# Patient Record
Sex: Female | Born: 1985 | Race: White | Hispanic: No | Marital: Married | Smoking: Never smoker
Health system: Southern US, Community
[De-identification: ages and names within clinical notes are randomized; demographics above are authoritative.]

## PROBLEM LIST (undated history)

## (undated) ENCOUNTER — Inpatient Hospital Stay (HOSPITAL_COMMUNITY): Payer: Self-pay

## (undated) DIAGNOSIS — IMO0002 Reserved for concepts with insufficient information to code with codable children: Secondary | ICD-10-CM

## (undated) HISTORY — DX: Reserved for concepts with insufficient information to code with codable children: IMO0002

---

## 2007-01-16 ENCOUNTER — Inpatient Hospital Stay (HOSPITAL_COMMUNITY): Admission: AD | Admit: 2007-01-16 | Discharge: 2007-01-16 | Payer: Self-pay | Admitting: Obstetrics and Gynecology

## 2008-06-01 ENCOUNTER — Inpatient Hospital Stay (HOSPITAL_COMMUNITY): Admission: AD | Admit: 2008-06-01 | Discharge: 2008-06-01 | Payer: Self-pay | Admitting: Obstetrics and Gynecology

## 2008-12-12 ENCOUNTER — Inpatient Hospital Stay (HOSPITAL_COMMUNITY): Admission: AD | Admit: 2008-12-12 | Discharge: 2008-12-12 | Payer: Self-pay | Admitting: Obstetrics and Gynecology

## 2008-12-26 ENCOUNTER — Inpatient Hospital Stay (HOSPITAL_COMMUNITY): Admission: AD | Admit: 2008-12-26 | Discharge: 2008-12-26 | Payer: Self-pay | Admitting: Obstetrics and Gynecology

## 2008-12-27 ENCOUNTER — Inpatient Hospital Stay (HOSPITAL_COMMUNITY): Admission: AD | Admit: 2008-12-27 | Discharge: 2008-12-29 | Payer: Self-pay | Admitting: Obstetrics and Gynecology

## 2009-10-24 ENCOUNTER — Emergency Department (HOSPITAL_COMMUNITY): Admission: EM | Admit: 2009-10-24 | Discharge: 2009-10-24 | Payer: Self-pay | Admitting: Emergency Medicine

## 2010-10-27 LAB — POCT I-STAT, CHEM 8
Calcium, Ion: 1.11 mmol/L — ABNORMAL LOW (ref 1.12–1.32)
Creatinine, Ser: 0.6 mg/dL (ref 0.4–1.2)
Glucose, Bld: 93 mg/dL (ref 70–99)
HCT: 50 % — ABNORMAL HIGH (ref 36.0–46.0)
Sodium: 141 mEq/L (ref 135–145)

## 2010-10-27 LAB — POCT PREGNANCY, URINE: Preg Test, Ur: NEGATIVE

## 2010-11-11 LAB — URINALYSIS, ROUTINE W REFLEX MICROSCOPIC
Ketones, ur: NEGATIVE mg/dL
Specific Gravity, Urine: 1.015 (ref 1.005–1.030)
Urobilinogen, UA: 0.2 mg/dL (ref 0.0–1.0)
pH: 8 (ref 5.0–8.0)

## 2010-11-11 LAB — WET PREP, GENITAL
Trich, Wet Prep: NONE SEEN
Yeast Wet Prep HPF POC: NONE SEEN

## 2010-11-11 LAB — CBC
HCT: 29.3 % — ABNORMAL LOW (ref 36.0–46.0)
Hemoglobin: 12.9 g/dL (ref 12.0–15.0)
MCV: 94.4 fL (ref 78.0–100.0)
Platelets: 122 10*3/uL — ABNORMAL LOW (ref 150–400)
RBC: 3.09 MIL/uL — ABNORMAL LOW (ref 3.87–5.11)
RBC: 3.94 MIL/uL (ref 3.87–5.11)
WBC: 14.6 10*3/uL — ABNORMAL HIGH (ref 4.0–10.5)

## 2010-12-16 NOTE — Discharge Summary (Signed)
NAMEMarland Kitchen  AMIRE, GOSSEN              ACCOUNT NO.:  [id number]   MEDICAL RECORD NO.:  [id number]          PATIENT TYPE:  INP   LOCATION:  9108                          FACILITY:  [facility]   PHYSICIAN:  Osborn Coho, M.D.   DATE OF BIRTH:  1986/07/06   DATE OF ADMISSION:  12/27/2008  DATE OF DISCHARGE:  12/29/2008                               DISCHARGE SUMMARY   ADMISSION DIAGNOSES:  1. Intrauterine pregnancy at 38 weeks.  2. Labor.  3. Group B strep negative.   DISCHARGE DIAGNOSES:  1. Intrauterine pregnancy at 27 and 1/7 weeks, delivered.  2. Maternal fatigue.   PROCEDURE:  Vacuum extraction, vaginal delivery.   HOSPITAL COURSE:  [name] is a 25 year old gravida 2, para 0-0-1-0,  admitted to 90 weeks' gestation with regular uterine contractions.  The  patient was found to be 45-cm dilated upon admission and was admitted  here early labor.  The patient's pregnancy remarkable for:  1. GBS negative.  2. First trimester spotting  3. Borderline platelets.   The patient admitted with contractions every 5 minutes with  irritability.  As noted above, the patient's cervix 4-5 cm dilated, 100%  effaced, and vertex at -1 to 0 station.  After admission, the patient  underwent artificial rupture of membranes with clear fluid present.  Variable decelerations with fetal heart rate noted.  Platelets upon  admission 168,000 with hemoglobin of 12.9.  The patient's labor  progressed and the patient became completely dilated.  The patient  pushed for greater than 2 hours and began to fatigue from the  pushing  process.  She was able to push the fetal head to a +3 station and the  fetal scalp could actually be seen at the perineum without separate in  the labia.  Risks, benefits, and alternatives of vaginal delivery with  vacuum assistance versus forceps discussed with the patient.  The  patient elected vacuum extraction.   The patient was positioned in lithotomy position.  Kiwi vacuum was  applied.  With 1 push, the patient was able to deliver the fetal head.  The patient delivered a 6 pounds 4 ounces female infant Clinical biochemist) without  difficulty from that point on.  Apgar scores were 8 and 9 and 1 and 5  minutes respectively.  There was a nuchal cord x1 present.  Estimated  blood loss was 400 mL.  Procedure was uncomplicated.  Perineum was found  to be intact, however, a second-degree right lateral vaginal laceration  and a second-degree left lateral vaginal laceration was noted.  No  cervical lacerations were noted.  The lateral vaginal lacerations were  repaired in the usual fashion using 2-0 Vicryl suture.  Hemostasis was  noted to be present.   On postpartum day #1, the patient's hemoglobin was noted to be 10.1.  The patient was ambulating, voiding, and tolerating p.o. liquids and  solids without difficulty.  The patient reporting minimal perineal pain.  The patient with no headaches, vision changes, or right upper quadrant  pain.  The patient requesting discharge to home at the present time.  It  was felt that the  patient had received full benefit of her hospital stay  and was discharged to home.  The patient reports that she elects a  Mirena IUD for contraception post discharge.  The patient's physical  exam was within normal limits.   DISCHARGE INSTRUCTIONS:  Per [facility] handout.   DISCHARGE MEDICATIONS:  1. Motrin 600 mg p.o. q.6 h. p.r.n. pain.  2. Tylox 1-2 tablets p.o. q.3-4 h. p.r.n. pain.   DISCHARGE FOLLOWUP:  Will occur in 6 weeks at [facility].       Rhona Leavens, CNM      Osborn Coho, M.D.  Electronically Signed    NOS/MEDQ  D:  12/29/2008  T:  12/30/2008  Job:  [id number]

## 2010-12-16 NOTE — Op Note (Signed)
NAMEMarland Levine  JOURNII, NIERMAN              ACCOUNT NO.:  [id number]   MEDICAL RECORD NO.:  [id number]          PATIENT TYPE:  INP   LOCATION:  9108                          [facility]ILITY:  [facility]   PHYSICIAN:  Janine Limbo, M.D.DATE OF BIRTH:  [birth year]   DATE OF PROCEDURE:  12/28/2008  DATE OF DISCHARGE:                               OPERATIVE REPORT   PREOPERATIVE DIAGNOSES:  1. Term intrauterine gestation.  2. Active labor.  3. Maternal fatigue.   POSTOPERATIVE DIAGNOSES:  1. Term intrauterine gestation.  2. Active labor.  3. Maternal fatigue.  4. Nuchal cord.  5. Second-degree lateral vaginal lacerations bilaterally.   PROCEDURE:  1. Vacuum extraction vaginal delivery.  2. Repair of vaginal lacerations.   OBSTETRICIAN:  Janine Limbo, MD   ANESTHETIC:  Epidural and local Xylocaine.   DISPOSITION:  [name] is a 25 year old female, gravida 2, para 0-0-1-  0, who presents at 75 weeks' gestation.  The patient has been followed  at the [facility].  The patient presented in active labor.  She  quickly dilated her cervix.  She pushed for greater than 2 hours and  began to fatigue 50 from the pushing process.  She was able to push the  fetal head to a +3 station and the fetal scalp could actually be seen at  the perineum without separating the labia.  We discussed our management  options which included continued pushing, operative vaginal delivery,  and cesarean delivery.  The risk and benefits of each of those options  were reviewed.  The patient elected to proceed with operative vaginal  delivery.  We discussed the options for forceps vaginal delivery and  vacuum extraction and vaginal delivery.  The risk and benefits of those  were reviewed.  The  patient elected to proceed with vacuum extraction  and vaginal delivery.  The specific risk associated with vacuum  extraction were outlined including caput  formation, hematoma formation,  the rare risk of intracranial bleeding, and the risk that the vacuum  extraction would be unsuccessful and that we would still need to proceed  with cesarean delivery.  After carefully considering all those options.  The patient, the baby's father, the patient's mother, and her sister all  agreed that we should proceed with vacuum extraction vaginal delivery.   FINDINGS:  A 6 pounds 4 ounces female infant Sol Passer) was delivered from  an occiput anterior presentation.  The Apgars scores were 8 at 1-minute  and 9 at 5 minutes.  There was a nuchal cord present.  There was a  second-degree right lateral vaginal laceration and a second-degree left  lateral vaginal laceration.  There were no perineal lacerations, vaginal  lacerations, or cervical lacerations present.   PROCEDURE IN DETAILS:  The patient was placed in a more lithotomy  position.  A Foley catheter had already been placed in the bladder, so  the Foley catheter was removed.  The perineum was prepped and draped in  a sterile fashion.  The Kiwi vacuum extractor was applied to the fetal  head.  With one long push the patient was able to deliver the fetal  head.  The mouth and nose were suctioned.  The nuchal cord was reduced.  The remainder of the infant was then delivered.  The cord was clamped  and cut and the infant was placed on the mother's abdomen.  The placenta  was delivered and was then given to the cord blood team for studies.  The patient was noted to have lacerations as mentioned above.  The  lacerations were repaired using 2-0 Vicryl.  Hemostasis was noted be  adequate.  The patient was returned to the supine position.  The baby  was left in the room with the parents for bonding.  The placenta was  sent to Labor and Delivery.      Janine Limbo, M.D.  Electronically Signed     AVS/MEDQ  D:  12/28/2008  T:  12/28/2008  Job:  [id number]

## 2010-12-16 NOTE — H&P (Signed)
NAMEMarland Kitchen  Julia, Levine NO.:  [id number]   MEDICAL RECORD NO.:  [id number]          PATIENT TYPE:  INP   LOCATION:  9163                          [facility]ILITY:  [facility]   PHYSICIAN:  Janine Limbo, M.D.DATE OF BIRTH:  11-Jun-1986   DATE OF ADMISSION:  12/27/2008  DATE OF DISCHARGE:                              HISTORY & PHYSICAL   [name] is a 25 year old gravida 2, para 0-0-1-0 at 40 weeks who  presents today with increasing uterine contractions since approximately  2 p.m.  The patient was seen in the hospital last evening for evaluation  for questionable leaking.  During that time, she was found to not be  leaking per pelvic exam.  She also had an ultrasound and a BPP.  Cervix  at that time was 2 cm and 80%.  Estimated fetal weight was noted to be 6  pounds 6 ounces.  Normal fluid was noted at 14.56 cm and 56 percentile,  and a BPP was 8/8.  She was then sent home to await increased labor.   HISTORY OF PRESENT PREGNANCY:  1. The patient's pregnancy has been remarkable for borderline      platelets of 118,000 at her new OB visit.  Then they were 144,000      at 29 weeks.  2. First trimester spotting.  3. Negative group B strep.   PRENATAL LABS:  Blood type is A positive, rh antibody negative, VDRL  nonreactive, rubella titer positive, hepatitis B surface antigen  negative, HIV nonreactive, GC and chlamydia cultures negative in  December, Pap was normal at that time, HSV1 and 2 were negative in  December, hepatitis C was also negative.  She had a normal first  trimester screen.  She also had a normal AFP.  She had a normal Glucola.  At her first OB visit, her platelet count was 118,000.  Her platelet  count at 29 weeks was 144,000.  She had HSV testing at 35 weeks for a  questionable lesion.  These were negative.  Group B strep culture was  negative at 36 weeks.   HISTORY OF PRESENT PREGNANCY:  The patient entered care at approximately  13 weeks.  She desired  first trimester screen.  This was done and was  normal.  She had an ultrasound at 19 weeks showing normal growth.  AFP  was normal.  She had an ultrasound again at 29 weeks for size smaller  than dates.  Estimated fetal weight was 56th percentile and normal fluid  was noted.  Platelet count on repeat was then 144,000.  She had had some  first trimester spotting.  At 35 weeks, she noted a small lesion or mole  on the left labia.  This was likely determined to be not an HSV lesion,  but it was cultured.  This was negative.  She also had a small  condylomata at the posterior fourchette.  The patient was then seen on  May 26 for questionable leaking.  During that time, she had some  variables on the fetal heart tones.  Estimated fetal weight was 39th  percentile  at 6 pounds and 6 ounces, normal fluid volume of 14.56 cm at  the 56th percentile, and a BPP of 8/8.  The abdominal circumference was  measuring at the 10th percentile, prior growth had been normal.  Her  cervix at that time was 2 at 80%.   OBSTETRICAL HISTORY:  In 2008, she had a 6-week miscarriage.   MEDICAL HISTORY:  In 2009, she had Trichomonas.  She reports usual  childhood illnesses.  She has occasional yeast infections.  She had a  UTI noted in 2009.  She has no known medication allergies.   FAMILY HISTORY:  Maternal grandmother had heart disease.  Paternal  grandmother had COPD.  Maternal grandmother also had kidney stones.  Her  mother and maternal grandmother, and other multiple gamily members do  smoke.   GENETIC HISTORY:  Remarkable for the patient's cousin having congenital  heart disease and having had surgery.  The father of the baby has  scoliosis.   SOCIAL HISTORY:  The patient is Caucasian.  She denies a religious  affiliation.  The father of the baby's name is [name].  The  patient has a high school education.  She is a Conservation officer, nature.  Her partner has  a high school education.  He is unemployed.  She has  been followed by  the physician service at [facility].  She denies any alcohol,  drug, or tobacco use during this pregnancy.   PHYSICAL EXAM:  VITAL SIGNS:  Stable.  The patient is afebrile.  HEENT:  Within normal limits.  LUNGS:  Breath sounds are clear.  HEART:  Regular rate and rhythm without murmur.  BREASTS:  Soft and nontender.  ABDOMEN:  Fundal height is approximately 37 cm.  Estimated fetal weight  is 6 to 7 pounds.  Uterine contractions every 5 minutes, moderate  quality.  Cervical exam is 4+, 100% vertex at a -1 to 0 station with  bulging bag of water.  Fetal heart rate is 150s to 160s with some mild  variables noted.  There are no late decelerations noted.  EXTREMITIES:  Deep tendon reflexes are 2+ without clonus.  There is no  edema noted.   IMPRESSION:  1. Intrauterine pregnancy at 38 weeks.  2. Early labor.  3. Negative group B strep.   PLAN:  1. Admit to birthing suite with consult with Dr. Marline Backbone as      attending physician.  2. Routine physician orders.  3. The patient currently declines epidural for pain medication.  4. M.D. will follow.      Renaldo Reel Emilee Hero, C.N.M.      Janine Limbo, M.D.  Electronically Signed    VLL/MEDQ  D:  12/27/2008  T:  12/27/2008  Job:  [id number]

## 2011-04-06 ENCOUNTER — Emergency Department (HOSPITAL_COMMUNITY)
Admission: EM | Admit: 2011-04-06 | Discharge: 2011-04-06 | Disposition: A | Discharge status: Home / Self Care | Payer: BC Managed Care – PPO | Attending: Emergency Medicine | Admitting: Emergency Medicine

## 2011-04-06 DIAGNOSIS — N3000 Acute cystitis without hematuria: Secondary | ICD-10-CM | POA: Insufficient documentation

## 2011-04-06 LAB — URINALYSIS, ROUTINE W REFLEX MICROSCOPIC: Bilirubin Urine: NEGATIVE

## 2011-04-06 LAB — URINE MICROSCOPIC-ADD ON

## 2011-04-06 MED ORDER — NITROFURANTOIN MONOHYD MACRO 100 MG PO CAPS: 100.0000 mg | ORAL_CAPSULE | Freq: Two times a day (BID) | ORAL | Status: AC

## 2011-04-06 NOTE — ED Provider Notes (Signed)
History     CSN: [id number] Arrival date & time: 04/06/2011 11:00 AM  Chief Complaint  Patient presents with  . Urinary Tract Infection   Patient is a 25 y.o. female presenting with urinary tract infection. The history is provided by the patient.  Urinary Tract Infection This is a new (she describes a 2 day history of increased urinary frequency,  pain with urination and low back ache.) problem. The current episode started in the past 7 days. The problem occurs constantly. The problem has been unchanged. Associated symptoms include urinary symptoms. Pertinent negatives include no abdominal pain, arthralgias, chest pain, congestion, fever, headaches, joint swelling, nausea, neck pain, numbness, rash, sore throat, vomiting or weakness. Exacerbated by: urination. Treatments tried: azo. The treatment provided mild relief.    History reviewed. No pertinent past medical history.  History reviewed. No pertinent past surgical history.  No family history on file.  History  Substance Use Topics  . Smoking status: Never Smoker   . Smokeless tobacco: Not on file  . Alcohol Use: No    OB History    Grav Para Term Preterm Abortions TAB SAB Ect Mult Living                  Review of Systems  Constitutional: Negative for fever.  HENT: Negative for congestion, sore throat and neck pain.   Eyes: Negative.   Respiratory: Negative for chest tightness and shortness of breath.   Cardiovascular: Negative for chest pain.  Gastrointestinal: Negative for nausea, vomiting and abdominal pain.  Genitourinary: Positive for dysuria, urgency and frequency. Negative for hematuria, flank pain, vaginal discharge and vaginal pain.  Musculoskeletal: Negative for joint swelling and arthralgias.  Skin: Negative.  Negative for rash and wound.  Neurological: Negative for dizziness, weakness, light-headedness, numbness and headaches.  Hematological: Negative.   Psychiatric/Behavioral: Negative.     Physical Exam   BP 114/73  Pulse 70  Temp(Src) 98.3 F (36.8 C) (Oral)  Resp 18  Ht 5\' 7"  (1.702 m)  Wt 140 lb (63.504 kg)  BMI 21.93 kg/m2  SpO2 100%  LMP 03/21/2011  Physical Exam  Nursing note and vitals reviewed. Constitutional: She is oriented to person, place, and time. She appears well-developed and well-nourished.  HENT:  Head: Normocephalic and atraumatic.  Eyes: Conjunctivae are normal.  Neck: Normal range of motion.  Cardiovascular: Normal rate, regular rhythm, normal heart sounds and intact distal pulses.   Pulmonary/Chest: Effort normal and breath sounds normal. She has no wheezes.  Abdominal: Soft. Bowel sounds are normal. There is no tenderness. There is no rebound and no guarding.  Musculoskeletal: Normal range of motion.  Neurological: She is alert and oriented to person, place, and time.  Skin: Skin is warm and dry.  Psychiatric: She has a normal mood and affect.    ED Course  Procedures  MDM Uncomplicated cystitis.      Candis Musa, PA 04/06/11 1201

## 2011-04-06 NOTE — ED Notes (Signed)
uti symptoms that started over the weekend, has not gotten any better,

## 2011-04-06 NOTE — ED Notes (Signed)
Pt reports urinary frequency, dysuria, and some mild cramping in her lower abdomen.  Pt has hx of uti's, so she reports taking otc meds over the weekend w/out relief.

## 2011-04-22 NOTE — ED Provider Notes (Signed)
Medical screening examination/treatment/procedure(s) were performed by non-physician practitioner and as supervising physician I was immediately available for consultation/collaboration.   Shelda Jakes, MD 04/22/11 [phone]

## 2011-05-04 LAB — CBC
HCT: 40.3
Hemoglobin: 13.5
MCHC: 33.5
MCV: 93.8
Platelets: 165
RBC: 4.3
RDW: 12.6
WBC: 9

## 2011-05-04 LAB — WET PREP, GENITAL

## 2011-05-04 LAB — HCG, QUANTITATIVE, PREGNANCY: hCG, Beta Chain, Quant, S: 148662 — ABNORMAL HIGH

## 2011-05-20 LAB — URINALYSIS, ROUTINE W REFLEX MICROSCOPIC
Glucose, UA: NEGATIVE
Nitrite: NEGATIVE
Protein, ur: NEGATIVE
Urobilinogen, UA: 0.2
pH: 7.5

## 2011-05-20 LAB — URINE MICROSCOPIC-ADD ON

## 2011-05-20 LAB — POCT PREGNANCY, URINE: Operator id: 275371

## 2011-05-20 LAB — ABO/RH: ABO/RH(D): A POS

## 2011-08-04 NOTE — L&D Delivery Note (Addendum)
Delivery Note for Twin A Called to evaluate patient with imminent delivery of [redacted] week GA twins.  At 2:28 PM on 05/11/2012 a viable female (Twin A) was delivered via Vaginal, Spontaneous Delivery (Presentation: cephalic; Occiput Anterior).  APGAR: 5, 7; weight 1 lb 9.4 oz (720 g).   Placenta status: retained, not delivered, 3 vessel cord was cut and tied off with 3-0 Vicryl suture, end tucked into vagina.    Twin B was noted to be at -4 to -5 station, breech presentation, intact membranes.  Stable FHR on monitor, no signs of chorioamnionitis, no signs of abruption or any maternal-fetal distress.  Therefore, there was no reason to hasten delivery of Twin B.  Patient was told that labor can progress spontaneously soon, or delivery may be indicated for any of the aforementioned situations.   Anesthesia: Epidural  Episiotomy: None Lacerations: None Est. Blood Loss (mL): 150  Mom remained on L&D for further close monitoring.  Baby to NICU.   Delivery Note for Twin B Called to evaluate patient with PPROM around 6:08PM on 05/12/2012 with frequent contractions and pelvic pressure.  On exam, she was noted to have full cervical dilation, ruptured membranes, fetal breech presentation at +3 station.  After a couple of pushes, at 6:31 PM on 05/12/2012, a viable female (Twin B) was delivered via Vaginal, Spontaneous Delivery (Presentation: breech).  APGAR: 4, 7; weight 1 lb 6 oz.    Both placentas were delivered after injection of the cords with a solution of pitocin 10 units in normal saline; both placentas had 3VC and were sent to pathology.  Anesthesia: Epidural  Episiotomy: None Lacerations: None Est. Blood Loss (mL): 150  Mom to postpartum.  Baby to NICU.  Jaynie Collins, MD, FACOG Attending Obstetrician & Gynecologist Faculty Practice, [facility] [place]

## 2011-10-02 DIAGNOSIS — R87619 Unspecified abnormal cytological findings in specimens from cervix uteri: Secondary | ICD-10-CM

## 2011-10-02 DIAGNOSIS — IMO0002 Reserved for concepts with insufficient information to code with codable children: Secondary | ICD-10-CM

## 2011-10-02 HISTORY — DX: Reserved for concepts with insufficient information to code with codable children: IMO0002

## 2011-10-02 HISTORY — DX: Unspecified abnormal cytological findings in specimens from cervix uteri: R87.619

## 2011-10-02 HISTORY — PX: COLPOSCOPY: SHX161

## 2012-01-20 ENCOUNTER — Encounter (HOSPITAL_COMMUNITY): Payer: Self-pay | Admitting: *Deleted

## 2012-01-20 ENCOUNTER — Inpatient Hospital Stay (HOSPITAL_COMMUNITY)
Admission: AD | Admit: 2012-01-20 | Discharge: 2012-01-20 | Disposition: A | Discharge status: Home / Self Care | Payer: Managed Care, Other (non HMO) | Source: Ambulatory Visit | Attending: Obstetrics and Gynecology | Admitting: Obstetrics and Gynecology

## 2012-01-20 DIAGNOSIS — A084 Viral intestinal infection, unspecified: Secondary | ICD-10-CM

## 2012-01-20 DIAGNOSIS — Z331 Pregnant state, incidental: Secondary | ICD-10-CM

## 2012-01-20 DIAGNOSIS — O210 Mild hyperemesis gravidarum: Secondary | ICD-10-CM | POA: Insufficient documentation

## 2012-01-20 DIAGNOSIS — A088 Other specified intestinal infections: Secondary | ICD-10-CM | POA: Insufficient documentation

## 2012-01-20 DIAGNOSIS — O99891 Other specified diseases and conditions complicating pregnancy: Secondary | ICD-10-CM | POA: Insufficient documentation

## 2012-01-20 LAB — URINALYSIS, ROUTINE W REFLEX MICROSCOPIC
Glucose, UA: NEGATIVE mg/dL
Leukocytes, UA: NEGATIVE
Nitrite: NEGATIVE
Specific Gravity, Urine: 1.025 (ref 1.005–1.030)
pH: 6.5 (ref 5.0–8.0)

## 2012-01-20 LAB — POCT PREGNANCY, URINE: Preg Test, Ur: POSITIVE — AB

## 2012-01-20 MED ORDER — ONDANSETRON HCL 4 MG PO TABS: 4.0000 mg | ORAL_TABLET | ORAL | Status: AC | PRN

## 2012-01-20 MED ORDER — DEXTROSE IN LACTATED RINGERS 5 % IV SOLN
25.0000 mg | Freq: Once | INTRAVENOUS | Status: DC
  Filled 2012-01-20: qty 1

## 2012-01-20 MED ORDER — DEXTROSE 5 % IN LACTATED RINGERS IV BOLUS: 1000.0000 mL | Freq: Once | INTRAVENOUS | Status: DC

## 2012-01-20 MED ORDER — SODIUM CHLORIDE 0.9 % IJ SOLN
INTRAMUSCULAR | Status: AC
  Filled 2012-01-20: qty 3

## 2012-01-20 NOTE — MAU Provider Note (Signed)
History   Julia Levine is a G1P0 at ~ 8wks gest w/ + SPT 12/24/2011 at [facility]. Reports mild nausea of pregnancy until yesterday when sudden onset of emesis and diarrhea, multiple episodes. Last emesis episode this AM Some upper abdominal cramping. No fever, no VB. Reports co-worker w/ similar symptoms the day before.   Chief Complaint  Patient presents with  . Emesis     OB History    Grav Para Term Preterm Abortions TAB SAB Ect Mult Living   1               History reviewed. No pertinent past medical history.  History reviewed. No pertinent past surgical history.  History reviewed. No pertinent family history.  History  Substance Use Topics  . Smoking status: Never Smoker   . Smokeless tobacco: Not on file  . Alcohol Use: No    Allergies: No Known Allergies  Prescriptions prior to admission  Medication Sig Dispense Refill  . Prenatal Vit-Fe Fumarate-FA (PRENATAL MULTIVITAMIN) TABS Take 1 tablet by mouth every morning.        ROS as noted above Physical Exam   Blood pressure 116/50, pulse 78, temperature 98.8 F (37.1 C), temperature source Oral, resp. rate 16, height 5\' 7"  (1.702 m), weight 66.679 kg (147 lb), last menstrual period 03/21/2011.  Results for orders placed during the hospital encounter of 01/20/12 (from the past 72 hour(s))  URINALYSIS, ROUTINE W REFLEX MICROSCOPIC     Status: Abnormal   Collection Time   01/20/12 11:45 AM      Component Value Range Comment   Color, Urine YELLOW  YELLOW    APPearance CLEAR  CLEAR    Specific Gravity, Urine 1.025  1.005 - 1.030    pH 6.5  5.0 - 8.0    Glucose, UA NEGATIVE  NEGATIVE mg/dL    Hgb urine dipstick NEGATIVE  NEGATIVE    Bilirubin Urine NEGATIVE  NEGATIVE    Ketones, ur >80 (*) NEGATIVE mg/dL    Protein, ur NEGATIVE  NEGATIVE mg/dL    Urobilinogen, UA 1.0  0.0 - 1.0 mg/dL    Nitrite NEGATIVE  NEGATIVE    Leukocytes, UA NEGATIVE  NEGATIVE MICROSCOPIC NOT DONE ON URINES WITH NEGATIVE PROTEIN, BLOOD,  LEUKOCYTES, NITRITE, OR GLUCOSE <1000 mg/dL.  POCT PREGNANCY, URINE     Status: Abnormal   Collection Time   01/20/12 11:47 AM      Component Value Range Comment   Preg Test, Ur POSITIVE (*) NEGATIVE    Gen: AAO x3, NAD Skin: mucous memb moist Abd: NT, no mases Pelvic; deferred  Ext: no edema  ED Course  IV bolus for + urine Ketones - D5LR 1 L Phenergan IV  Light snack tolerated.   A/P: Suspect viral gastroenteritis-resolving Symptoms improved after IV hydration and antiemetic   Incidental early pregnancy  Will DC home after IV 1L infusion complete BRAT diet instructions for next few days Zofran on hand for recurrent emesis  F/U in office for scheduled HED/NOB or if symptoms persist past next 2-3 days or worsen.   Julia Levine  01/20/2012 2:15 PM

## 2012-01-20 NOTE — Discharge Instructions (Signed)
B.R.A.T. Diet Your doctor has recommended the B.R.A.T. diet for you or your child until the condition improves. This is often used to help control diarrhea and vomiting symptoms. If you or your child can tolerate clear liquids, you may have:  Bananas.   Rice.   Applesauce.   Toast (and other simple starches such as crackers, potatoes, noodles).  Be sure to avoid dairy products, meats, and fatty foods until symptoms are better. Fruit juices such as apple, grape, and prune juice can make diarrhea worse. Avoid these. Continue this diet for 2 days or as instructed by your caregiver. Document Released: 07/20/2005 Document Revised: 07/09/2011 Document Reviewed: 01/06/2007 Exit[employer] Patient Information 2012 [employer]. 

## 2012-01-20 NOTE — MAU Note (Signed)
Onset of vomiting since yesterday, at first started with diarrhea now just vomiting, vomited x 3, 8 weeks, dull left lower abdominal pain.

## 2012-01-27 ENCOUNTER — Encounter: Payer: Self-pay | Admitting: Obstetrics and Gynecology

## 2012-01-27 ENCOUNTER — Ambulatory Visit (INDEPENDENT_AMBULATORY_CARE_PROVIDER_SITE_OTHER): Payer: Managed Care, Other (non HMO) | Admitting: Obstetrics and Gynecology

## 2012-01-27 VITALS — BP 98/62 | Wt 148.0 lb

## 2012-01-27 DIAGNOSIS — IMO0002 Reserved for concepts with insufficient information to code with codable children: Secondary | ICD-10-CM

## 2012-01-27 DIAGNOSIS — N871 Moderate cervical dysplasia: Secondary | ICD-10-CM | POA: Insufficient documentation

## 2012-01-27 DIAGNOSIS — O30009 Twin pregnancy, unspecified number of placenta and unspecified number of amniotic sacs, unspecified trimester: Secondary | ICD-10-CM

## 2012-01-27 DIAGNOSIS — Z3480 Encounter for supervision of other normal pregnancy, unspecified trimester: Secondary | ICD-10-CM

## 2012-01-27 DIAGNOSIS — R6889 Other general symptoms and signs: Secondary | ICD-10-CM

## 2012-01-27 DIAGNOSIS — O30049 Twin pregnancy, dichorionic/diamniotic, unspecified trimester: Secondary | ICD-10-CM | POA: Insufficient documentation

## 2012-01-27 LAB — OB RESULTS CONSOLE RUBELLA ANTIBODY, IGM: Rubella: IMMUNE

## 2012-01-27 LAB — OB RESULTS CONSOLE HIV ANTIBODY (ROUTINE TESTING): HIV: NONREACTIVE

## 2012-01-27 NOTE — Progress Notes (Signed)
   Subjective:    Julia Levine is a [id number] [redacted]w[redacted]d being seen today for her first obstetrical visit.  Her obstetrical history is significant for no complications. Patient does not intend to breast feed. Pregnancy history fully reviewed.  Patient reports nausea and well controlled with zofran.  Filed Vitals:   01/27/12 0900  BP: 98/62  Weight: 148 lb (67.132 kg)    HISTORY: OB History    Grav Para Term Preterm Abortions TAB SAB Ect Mult Living   3 1 1  1  1   1      # Outc Date GA Lbr Len/2nd Wgt Sex Del Anes PTL Lv   1 TRM 5/10 [redacted]w[redacted]d  6lb6oz(2.892kg) F SVD EPI  Yes   Comments: System Generated. Please review and update pregnancy details.   2 SAB 8/12 [redacted]w[redacted]d   U    No   3 CUR              Past Medical History  Diagnosis Date  . 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 Colposcopy 10/2011    abnormal pap HPV   Family History  Problem Relation Age of Onset  . Hypertension Mother      Exam    Uterus:     Pelvic Exam:    Perineum: Normal Perineum   Vulva: normal   Vagina:  normal mucosa, normal discharge   pH:    Cervix: closed and long   Adnexa: normal adnexa and no mass, fullness, tenderness   Bony Pelvis: adequate  System: Breast:  normal appearance, no masses or tenderness, No nipple discharge or bleeding, No axillary or supraclavicular adenopathy   Skin: normal coloration and turgor, no rashes    Neurologic: oriented, grossly non-focal   Extremities: normal strength, tone, and muscle mass   HEENT extra ocular movement intact   Mouth/Teeth mucous membranes moist, pharynx normal without lesions and dental hygiene good   Neck supple and no masses   Cardiovascular: regular rate and rhythm   Respiratory:  chest clear, no wheezing, crepitations, rhonchi, normal symmetric air entry   Abdomen: soft, non-tender; bowel sounds normal; no masses,  no organomegaly   Urinary:       Assessment:    Pregnancy: G3P1011 Patient  Active Problem List  Diagnosis  . Supervision of other normal pregnancy  . Abnormal Pap smear        Plan:     Initial labs drawn. Prenatal vitamins. Problem list reviewed and updated. Genetic Screening discussed First Screen: requested.  Ultrasound discussed; fetal survey: will be ordered at next visit.  Follow up in 4 weeks. 30 min visit spent on counseling and coordination of care.     Walther Sanagustin 01/27/2012

## 2012-01-28 ENCOUNTER — Other Ambulatory Visit: Payer: Self-pay | Admitting: Obstetrics and Gynecology

## 2012-01-28 DIAGNOSIS — IMO0001 Reserved for inherently not codable concepts without codable children: Secondary | ICD-10-CM

## 2012-01-28 DIAGNOSIS — Z3682 Encounter for antenatal screening for nuchal translucency: Secondary | ICD-10-CM

## 2012-01-28 LAB — SPECIMEN STATUS REPORT

## 2012-01-29 LAB — CULTURE, URINE COMPREHENSIVE

## 2012-01-29 LAB — HIV ANTIBODY (ROUTINE TESTING W REFLEX)
HIV 1/O/2 Abs-Index Value: <1 (ref <1.00)
HIV-1/HIV-2 Ab: NONREACTIVE

## 2012-02-09 LAB — PRENATAL PROFILE I(LABCORP)
Antibody Screen: NEGATIVE
Basophils Absolute: 0 10*3/uL (ref 0.0–0.2)
Eos: 1 % (ref 0–7)
Immature Grans (Abs): 0 10*3/uL (ref 0.0–0.1)
Immature Granulocytes: 0 % (ref 0–2)
Lymphocytes Absolute: 1.5 10*3/uL (ref 0.7–4.5)
MCV: 96 fL (ref 79–97)
Monocytes Absolute: 0.4 10*3/uL (ref 0.1–1.0)
Monocytes: 6 % (ref 4–13)
Neutrophils Relative %: 71 % (ref 40–74)
Platelets: 164 10*3/uL (ref 140–415)
RBC: 4.17 x10E6/uL (ref 3.77–5.28)
RDW: 13.6 % (ref 12.3–15.4)
Rh Factor: POSITIVE
WBC: 6.9 10*3/uL (ref 4.0–10.5)

## 2012-02-09 LAB — SPECIMEN STATUS REPORT

## 2012-02-17 ENCOUNTER — Encounter (HOSPITAL_COMMUNITY): Payer: Self-pay

## 2012-02-17 ENCOUNTER — Ambulatory Visit (HOSPITAL_COMMUNITY)
Admission: RE | Admit: 2012-02-17 | Discharge: 2012-02-17 | Disposition: A | Discharge status: Home / Self Care | Payer: Managed Care, Other (non HMO) | Source: Ambulatory Visit | Attending: Obstetrics and Gynecology | Admitting: Obstetrics and Gynecology

## 2012-02-17 ENCOUNTER — Other Ambulatory Visit: Payer: Self-pay

## 2012-02-17 VITALS — BP 114/66 | HR 102 | Wt 147.5 lb

## 2012-02-17 DIAGNOSIS — Z3689 Encounter for other specified antenatal screening: Secondary | ICD-10-CM | POA: Insufficient documentation

## 2012-02-17 DIAGNOSIS — Z3682 Encounter for antenatal screening for nuchal translucency: Secondary | ICD-10-CM

## 2012-02-17 DIAGNOSIS — Z1389 Encounter for screening for other disorder: Secondary | ICD-10-CM

## 2012-02-17 DIAGNOSIS — O351XX0 Maternal care for (suspected) chromosomal abnormality in fetus, not applicable or unspecified: Secondary | ICD-10-CM | POA: Insufficient documentation

## 2012-02-17 DIAGNOSIS — O30009 Twin pregnancy, unspecified number of placenta and unspecified number of amniotic sacs, unspecified trimester: Secondary | ICD-10-CM | POA: Insufficient documentation

## 2012-02-17 DIAGNOSIS — IMO0001 Reserved for inherently not codable concepts without codable children: Secondary | ICD-10-CM

## 2012-02-17 DIAGNOSIS — O3510X0 Maternal care for (suspected) chromosomal abnormality in fetus, unspecified, not applicable or unspecified: Secondary | ICD-10-CM | POA: Insufficient documentation

## 2012-02-17 NOTE — Progress Notes (Signed)
Patient seen today  for ultrasound appointment.  See full report in [facility].  Alpha Gula, MD  DC/DA twin gestation with best dates of 12 0/7 weeks Nasal bones visualized x 2 First trimester screen completed as above  Recommend follow up ultrasound in 6 weeks for detailed anatomy scan.

## 2012-02-26 ENCOUNTER — Ambulatory Visit (INDEPENDENT_AMBULATORY_CARE_PROVIDER_SITE_OTHER): Payer: Managed Care, Other (non HMO) | Admitting: Obstetrics & Gynecology

## 2012-02-26 ENCOUNTER — Encounter: Payer: Self-pay | Admitting: Obstetrics & Gynecology

## 2012-02-26 DIAGNOSIS — Z3480 Encounter for supervision of other normal pregnancy, unspecified trimester: Secondary | ICD-10-CM

## 2012-02-26 NOTE — Progress Notes (Signed)
Some nausea, Zofran did not seem to help.

## 2012-02-26 NOTE — Progress Notes (Signed)
Routine visit. No problems. First screen normal. Anatomy scan on 03/31/12.

## 2012-02-29 ENCOUNTER — Encounter: Payer: Self-pay | Admitting: Obstetrics and Gynecology

## 2012-03-25 ENCOUNTER — Encounter: Payer: Managed Care, Other (non HMO) | Admitting: Obstetrics and Gynecology

## 2012-03-25 ENCOUNTER — Ambulatory Visit (INDEPENDENT_AMBULATORY_CARE_PROVIDER_SITE_OTHER): Payer: Managed Care, Other (non HMO) | Admitting: Obstetrics and Gynecology

## 2012-03-25 VITALS — BP 94/56 | Wt 154.0 lb

## 2012-03-25 DIAGNOSIS — R6889 Other general symptoms and signs: Secondary | ICD-10-CM

## 2012-03-25 DIAGNOSIS — O30009 Twin pregnancy, unspecified number of placenta and unspecified number of amniotic sacs, unspecified trimester: Secondary | ICD-10-CM

## 2012-03-25 DIAGNOSIS — O30049 Twin pregnancy, dichorionic/diamniotic, unspecified trimester: Secondary | ICD-10-CM

## 2012-03-25 DIAGNOSIS — IMO0002 Reserved for concepts with insufficient information to code with codable children: Secondary | ICD-10-CM

## 2012-03-25 DIAGNOSIS — Z3480 Encounter for supervision of other normal pregnancy, unspecified trimester: Secondary | ICD-10-CM

## 2012-03-25 NOTE — Progress Notes (Signed)
Patient doing well without complaints. F?u anatomy scan next week

## 2012-03-31 ENCOUNTER — Other Ambulatory Visit (HOSPITAL_COMMUNITY): Payer: Self-pay | Admitting: Maternal and Fetal Medicine

## 2012-03-31 ENCOUNTER — Encounter (HOSPITAL_COMMUNITY): Payer: Self-pay

## 2012-03-31 ENCOUNTER — Ambulatory Visit (HOSPITAL_COMMUNITY)
Admission: RE | Admit: 2012-03-31 | Discharge: 2012-03-31 | Disposition: A | Discharge status: Home / Self Care | Payer: Managed Care, Other (non HMO) | Source: Ambulatory Visit | Attending: Obstetrics and Gynecology | Admitting: Obstetrics and Gynecology

## 2012-03-31 DIAGNOSIS — Z363 Encounter for antenatal screening for malformations: Secondary | ICD-10-CM | POA: Insufficient documentation

## 2012-03-31 DIAGNOSIS — O30009 Twin pregnancy, unspecified number of placenta and unspecified number of amniotic sacs, unspecified trimester: Secondary | ICD-10-CM | POA: Insufficient documentation

## 2012-03-31 DIAGNOSIS — Z1389 Encounter for screening for other disorder: Secondary | ICD-10-CM

## 2012-03-31 DIAGNOSIS — O358XX0 Maternal care for other (suspected) fetal abnormality and damage, not applicable or unspecified: Secondary | ICD-10-CM | POA: Insufficient documentation

## 2012-03-31 DIAGNOSIS — IMO0001 Reserved for inherently not codable concepts without codable children: Secondary | ICD-10-CM

## 2012-04-12 ENCOUNTER — Encounter: Payer: Managed Care, Other (non HMO) | Admitting: Obstetrics & Gynecology

## 2012-04-12 ENCOUNTER — Ambulatory Visit (INDEPENDENT_AMBULATORY_CARE_PROVIDER_SITE_OTHER): Payer: Managed Care, Other (non HMO) | Admitting: Obstetrics & Gynecology

## 2012-04-12 VITALS — BP 98/62 | Wt 158.0 lb

## 2012-04-12 DIAGNOSIS — O26899 Other specified pregnancy related conditions, unspecified trimester: Secondary | ICD-10-CM

## 2012-04-12 DIAGNOSIS — O30009 Twin pregnancy, unspecified number of placenta and unspecified number of amniotic sacs, unspecified trimester: Secondary | ICD-10-CM

## 2012-04-12 DIAGNOSIS — N898 Other specified noninflammatory disorders of vagina: Secondary | ICD-10-CM

## 2012-04-12 DIAGNOSIS — Z3480 Encounter for supervision of other normal pregnancy, unspecified trimester: Secondary | ICD-10-CM

## 2012-04-12 DIAGNOSIS — O30049 Twin pregnancy, dichorionic/diamniotic, unspecified trimester: Secondary | ICD-10-CM

## 2012-04-12 NOTE — Patient Instructions (Addendum)
Pregnancy - Second Trimester The second trimester of pregnancy (3 to 6 months) is a period of rapid growth for you and your baby. At the end of the sixth month, your baby is about 9 inches long and weighs 1 1/2 pounds. You will begin to feel the baby move between 18 and 20 weeks of the pregnancy. This is called quickening. Weight gain is faster. A clear fluid (colostrum) may leak out of your breasts. You may feel small contractions of the womb (uterus). This is known as false labor or Braxton-Hicks contractions. This is like a practice for labor when the baby is ready to be born. Usually, the problems with morning sickness have usually passed by the end of your first trimester. Some women develop small dark blotches (called cholasma, mask of pregnancy) on their face that usually goes away after the baby is born. Exposure to the sun makes the blotches worse. Acne may also develop in some pregnant women and pregnant women who have acne, may find that it goes away. PRENATAL EXAMS  Blood work may continue to be done during prenatal exams. These tests are done to check on your health and the probable health of your baby. Blood work is used to follow your blood levels (hemoglobin). Anemia (low hemoglobin) is common during pregnancy. Iron and vitamins are given to help prevent this. You will also be checked for diabetes between 24 and 28 weeks of the pregnancy. Some of the previous blood tests may be repeated.   The size of the uterus is measured during each visit. This is to make sure that the baby is continuing to grow properly according to the dates of the pregnancy.   Your blood pressure is checked every prenatal visit. This is to make sure you are not getting toxemia.   Your urine is checked to make sure you do not have an infection, diabetes or protein in the urine.   Your weight is checked often to make sure gains are happening at the suggested rate. This is to ensure that both you and your baby are  growing normally.   Sometimes, an ultrasound is performed to confirm the proper growth and development of the baby. This is a test which bounces harmless sound waves off the baby so your caregiver can more accurately determine due dates.  Sometimes, a specialized test is done on the amniotic fluid surrounding the baby. This test is called an amniocentesis. The amniotic fluid is obtained by sticking a needle into the belly (abdomen). This is done to check the chromosomes in instances where there is a concern about possible genetic problems with the baby. It is also sometimes done near the end of pregnancy if an early delivery is required. In this case, it is done to help make sure the baby's lungs are mature enough for the baby to live outside of the womb. CHANGES OCCURING IN THE SECOND TRIMESTER OF PREGNANCY Your body goes through many changes during pregnancy. They vary from person to person. Talk to your caregiver about changes you notice that you are concerned about.  During the second trimester, you will likely have an increase in your appetite. It is normal to have cravings for certain foods. This varies from person to person and pregnancy to pregnancy.   Your lower abdomen will begin to bulge.   You may have to urinate more often because the uterus and baby are pressing on your bladder. It is also common to get more bladder infections during pregnancy (  pain with urination). You can help this by drinking lots of fluids and emptying your bladder before and after intercourse.   You may begin to get stretch marks on your hips, abdomen, and breasts. These are normal changes in the body during pregnancy. There are no exercises or medications to take that prevent this change.   You may begin to develop swollen and bulging veins (varicose veins) in your legs. Wearing support hose, elevating your feet for 15 minutes, 3 to 4 times a day and limiting salt in your diet helps lessen the problem.    Heartburn may develop as the uterus grows and pushes up against the stomach. Antacids recommended by your caregiver helps with this problem. Also, eating smaller meals 4 to 5 times a day helps.   Constipation can be treated with a stool softener or adding bulk to your diet. Drinking lots of fluids, vegetables, fruits, and whole grains are helpful.   Exercising is also helpful. If you have been very active up until your pregnancy, most of these activities can be continued during your pregnancy. If you have been less active, it is helpful to start an exercise program such as walking.   Hemorrhoids (varicose veins in the rectum) may develop at the end of the second trimester. Warm sitz baths and hemorrhoid cream recommended by your caregiver helps hemorrhoid problems.   Backaches may develop during this time of your pregnancy. Avoid heavy lifting, wear low heal shoes and practice good posture to help with backache problems.   Some pregnant women develop tingling and numbness of their hand and fingers because of swelling and tightening of ligaments in the wrist (carpel tunnel syndrome). This goes away after the baby is born.   As your breasts enlarge, you may have to get a bigger bra. Get a comfortable, cotton, support bra. Do not get a nursing bra until the last month of the pregnancy if you will be nursing the baby.   You may get a dark line from your belly button to the pubic area called the linea nigra.   You may develop rosy cheeks because of increase blood flow to the face.   You may develop spider looking lines of the face, neck, arms and chest. These go away after the baby is born.  HOME CARE INSTRUCTIONS   It is extremely important to avoid all smoking, herbs, alcohol, and unprescribed drugs during your pregnancy. These chemicals affect the formation and growth of the baby. Avoid these chemicals throughout the pregnancy to ensure the delivery of a healthy infant.   Most of your home  care instructions are the same as suggested for the first trimester of your pregnancy. Keep your caregiver's appointments. Follow your caregiver's instructions regarding medication use, exercise and diet.   During pregnancy, you are providing food for you and your baby. Continue to eat regular, well-balanced meals. Choose foods such as meat, fish, milk and other low fat dairy products, vegetables, fruits, and whole-grain breads and cereals. Your caregiver will tell you of the ideal weight gain.   A physical sexual relationship may be continued up until near the end of pregnancy if there are no other problems. Problems could include early (premature) leaking of amniotic fluid from the membranes, vaginal bleeding, abdominal pain, or other medical or pregnancy problems.   Exercise regularly if there are no restrictions. Check with your caregiver if you are unsure of the safety of some of your exercises. The greatest weight gain will occur in the   last 2 trimesters of pregnancy. Exercise will help you:   Control your weight.   Get you in shape for labor and delivery.   Lose weight after you have the baby.   Wear a good support or jogging bra for breast tenderness during pregnancy. This may help if worn during sleep. Pads or tissues may be used in the bra if you are leaking colostrum.   Do not use hot tubs, steam rooms or saunas throughout the pregnancy.   Wear your seat belt at all times when driving. This protects you and your baby if you are in an accident.   Avoid raw meat, uncooked cheese, cat litter boxes and soil used by cats. These carry germs that can cause birth defects in the baby.   The second trimester is also a good time to visit your dentist for your dental health if this has not been done yet. Getting your teeth cleaned is OK. Use a soft toothbrush. Brush gently during pregnancy.   It is easier to loose urine during pregnancy. Tightening up and strengthening the pelvic muscles will  help with this problem. Practice stopping your urination while you are going to the bathroom. These are the same muscles you need to strengthen. It is also the muscles you would use as if you were trying to stop from passing gas. You can practice tightening these muscles up 10 times a set and repeating this about 3 times per day. Once you know what muscles to tighten up, do not perform these exercises during urination. It is more likely to contribute to an infection by backing up the urine.   Ask for help if you have financial, counseling or nutritional needs during pregnancy. Your caregiver will be able to offer counseling for these needs as well as refer you for other special needs.   Your skin may become oily. If so, wash your face with mild soap, use non-greasy moisturizer and oil or cream based makeup.  MEDICATIONS AND DRUG USE IN PREGNANCY  Take prenatal vitamins as directed. The vitamin should contain 1 milligram of folic acid. Keep all vitamins out of reach of children. Only a couple vitamins or tablets containing iron may be fatal to a baby or young child when ingested.   Avoid use of all medications, including herbs, over-the-counter medications, not prescribed or suggested by your caregiver. Only take over-the-counter or prescription medicines for pain, discomfort, or fever as directed by your caregiver. Do not use aspirin.   Let your caregiver also know about herbs you may be using.   Alcohol is related to a number of birth defects. This includes fetal alcohol syndrome. All alcohol, in any form, should be avoided completely. Smoking will cause low birth rate and premature babies.   Street or illegal drugs are very harmful to the baby. They are absolutely forbidden. A baby born to an addicted mother will be addicted at birth. The baby will go through the same withdrawal an adult does.  SEEK MEDICAL CARE IF:  You have any concerns or worries during your pregnancy. It is better to call with  your questions if you feel they cannot wait, rather than worry about them. SEEK IMMEDIATE MEDICAL CARE IF:   An unexplained oral temperature above 102 F (38.9 C) develops, or as your caregiver suggests.   You have leaking of fluid from the vagina (birth canal). If leaking membranes are suspected, take your temperature and tell your caregiver of this when you call.   There   is vaginal spotting, bleeding, or passing clots. Tell your caregiver of the amount and how many pads are used. Light spotting in pregnancy is common, especially following intercourse.   You develop a bad smelling vaginal discharge with a change in the color from clear to white.   You continue to feel sick to your stomach (nauseated) and have no relief from remedies suggested. You vomit blood or coffee ground-like materials.   You lose more than 2 pounds of weight or gain more than 2 pounds of weight over 1 week, or as suggested by your caregiver.   You notice swelling of your face, hands, feet, or legs.   You get exposed to German measles and have never had them.   You are exposed to fifth disease or chickenpox.   You develop belly (abdominal) pain. Round ligament discomfort is a common non-cancerous (benign) cause of abdominal pain in pregnancy. Your caregiver still must evaluate you.   You develop a bad headache that does not go away.   You develop fever, diarrhea, pain with urination, or shortness of breath.   You develop visual problems, blurry, or double vision.   You fall or are in a car accident or any kind of trauma.   There is mental or physical violence at home.  Document Released: 07/14/2001 Document Revised: 07/09/2011 Document Reviewed: 01/16/2009 [employer] Patient Information 2012 [employer]. 

## 2012-04-12 NOTE — Progress Notes (Signed)
Thick, yellow discharge seen on examination, wet prep sent. Will follow up results and manage accordingly. Normal anatomy x 2, scheduled for follow up scan at [facility].  No other complaints or concerns.  Preterm labor precautions reviewed.

## 2012-04-12 NOTE — Progress Notes (Signed)
Vaginal discharge x 1 week, is getting "thicker"

## 2012-04-13 ENCOUNTER — Telehealth: Payer: Self-pay | Admitting: *Deleted

## 2012-04-13 DIAGNOSIS — B373 Candidiasis of vulva and vagina: Secondary | ICD-10-CM

## 2012-04-13 LAB — WET PREP, GENITAL

## 2012-04-13 MED ORDER — FLUCONAZOLE 150 MG PO TABS: 150.0000 mg | ORAL_TABLET | Freq: Once | ORAL | Status: AC

## 2012-04-13 NOTE — Telephone Encounter (Signed)
Patients lab results have come back showing she has a yeast infection.  Diflucan has been called in for her.

## 2012-04-21 ENCOUNTER — Encounter: Payer: Managed Care, Other (non HMO) | Admitting: Obstetrics & Gynecology

## 2012-04-22 ENCOUNTER — Encounter (HOSPITAL_COMMUNITY): Payer: Self-pay | Admitting: *Deleted

## 2012-04-22 ENCOUNTER — Inpatient Hospital Stay (HOSPITAL_COMMUNITY)
Admission: AD | Admit: 2012-04-22 | Discharge: 2012-04-22 | Disposition: A | Discharge status: Home / Self Care | Payer: Managed Care, Other (non HMO) | Source: Ambulatory Visit | Attending: Obstetrics & Gynecology | Admitting: Obstetrics & Gynecology

## 2012-04-22 DIAGNOSIS — O26899 Other specified pregnancy related conditions, unspecified trimester: Secondary | ICD-10-CM

## 2012-04-22 DIAGNOSIS — N949 Unspecified condition associated with female genital organs and menstrual cycle: Secondary | ICD-10-CM

## 2012-04-22 DIAGNOSIS — O9989 Other specified diseases and conditions complicating pregnancy, childbirth and the puerperium: Secondary | ICD-10-CM

## 2012-04-22 DIAGNOSIS — O99891 Other specified diseases and conditions complicating pregnancy: Secondary | ICD-10-CM | POA: Insufficient documentation

## 2012-04-22 DIAGNOSIS — R109 Unspecified abdominal pain: Secondary | ICD-10-CM | POA: Insufficient documentation

## 2012-04-22 LAB — WET PREP, GENITAL
Clue Cells Wet Prep HPF POC: NONE SEEN
Yeast Wet Prep HPF POC: NONE SEEN

## 2012-04-22 LAB — URINALYSIS, ROUTINE W REFLEX MICROSCOPIC
Glucose, UA: NEGATIVE mg/dL
Leukocytes, UA: NEGATIVE
Protein, ur: NEGATIVE mg/dL
Urobilinogen, UA: 0.2 mg/dL (ref 0.0–1.0)

## 2012-04-22 LAB — URINE MICROSCOPIC-ADD ON

## 2012-04-22 NOTE — MAU Note (Addendum)
Twin preg.  Cramping since woke up this morning, ongoing pressure (pressure is not new). No bleeding or leaking.  Denies GI or GU problems. Some nausea

## 2012-04-22 NOTE — MAU Provider Note (Signed)
I saw and examined patient and agree with above. Negative wet prep. Urine with blood, no other evidence of infection - sent for culture. Has follow up next week. Likely round ligament or uterine cramping due to growing twin pregnancy. Napoleon Form, MD

## 2012-04-22 NOTE — MAU Note (Signed)
Pt in c/o period like cramps since waking up this morning.  Is pregnant with twins.  States she has not drank much water today.  Denies any bleeding or leaking of fluid.  Reports normal discharge.  + FM.

## 2012-04-22 NOTE — MAU Provider Note (Signed)
History     CSN: [id number]  Arrival date and time: 04/22/12 1253   First Provider Initiated Contact with Patient 04/22/12 1450      Chief Complaint  Patient presents with  . Abdominal Cramping   HPI  Patient is a 26 y.o. female  G3P1011 at [redacted]w[redacted]d presenting with abdominal cramping first noticed this morning.  Patient states she woke up and has been feeling menstrual cramping that is constant, and ranked a 4/10 on the pain scale.  She denies any prior incidence of menstrual cramping with this pregnancy.  Does endorse some nausea since this am but no vomiting. Denies any vaginal bleeding or discharge, no rush of fluids, no dysuria or foul-smelling urine.  Does endorse a "feeling a pressure within her vagina", but this is not a new sensation.  Denies any overt abdominal pain at present time but notes having a sharp pain a couple of weeks ago, going from her abdomen into her vagina.  Was told by another physician that this could be expected when carrying twins.  Patient had no complications during pregnancy with her first child.  Second child was spontaneously aborted at 5 weeks.    OB History    Grav Para Term Preterm Abortions TAB SAB Ect Mult Living   3 1 1  1  1   1       Past Medical History  Diagnosis Date  . 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 Colposcopy 10/2011    abnormal pap HPV    Family History  Problem Relation Age of Onset  . Hypertension Mother     History  Substance Use Topics  . Smoking status: Never Smoker   . Smokeless tobacco: Not on file  . Alcohol Use: No    Allergies: No Known Allergies  Prescriptions prior to admission  Medication Sig Dispense Refill  . Prenatal Vit-Fe Fumarate-FA (PRENATAL MULTIVITAMIN) TABS Take 1 tablet by mouth every morning.        Review of Systems  Constitutional: Negative.   Respiratory: Negative.   Cardiovascular: Negative.   Gastrointestinal: Positive for nausea and  abdominal pain. Negative for heartburn, vomiting, diarrhea, constipation, blood in stool and melena.  Genitourinary: Positive for frequency. Negative for dysuria, urgency, hematuria and flank pain.    Physical Exam   Blood pressure 122/60, pulse 91, temperature 98.4 F (36.9 C), temperature source Oral, resp. rate 20, height 5' 5.5" (1.664 m), weight 74.39 kg (164 lb), last menstrual period 11/25/2011.  Physical Exam  Physical Exam  General - WD, pregnant Caucasian female in NAD sitting comfortably on examination table  Heart - RRR; presence of a II/VI systolic murmur present upon auscultation.  Lungs - CTAB; no use of accessory muscles noted  Abdomen - BS +; S/NT/gravid abdomen Pelvic Exam - cervix closed; white, thin discharge noted.  No CMT or adnexal masses or tenderness noted on bimanual examination.     MAU Course  Procedures  Urinalysis -- Many bacteria, Many Squamous Epithelial Cells, Large amount of blood; no Nitrites or leukocyte esterase  Urine Culture -- pending  Urine Pregnancy Test -- Positive   Vaginal Wet Prep -- few WBCs; no concerning findings  G/C -- results pending          Assessment and Plan   Patient is [place] 26 y.o. female  [id number] at [redacted]w[redacted]d with slight pelvic cramping most likely normal to due stretching of the uterus  during pregnancy.  Patient instructed to use warm cloth and apply to area to help alleviate some of the cramping discomfort.    Will also discuss any abnormal results from urine culture and G/C with the patient and treat accordingly.  Patient to return to MAU if symptoms worsen or if new symptoms present.   Marcelline Mates 04/22/2012, 3:03 PM

## 2012-04-24 LAB — URINE CULTURE

## 2012-04-28 ENCOUNTER — Ambulatory Visit (INDEPENDENT_AMBULATORY_CARE_PROVIDER_SITE_OTHER): Payer: Managed Care, Other (non HMO) | Admitting: Obstetrics & Gynecology

## 2012-04-28 ENCOUNTER — Encounter: Payer: Self-pay | Admitting: Obstetrics & Gynecology

## 2012-04-28 VITALS — BP 97/66 | Wt 165.0 lb

## 2012-04-28 DIAGNOSIS — O30009 Twin pregnancy, unspecified number of placenta and unspecified number of amniotic sacs, unspecified trimester: Secondary | ICD-10-CM

## 2012-04-28 DIAGNOSIS — O30049 Twin pregnancy, dichorionic/diamniotic, unspecified trimester: Secondary | ICD-10-CM

## 2012-04-28 DIAGNOSIS — Z3480 Encounter for supervision of other normal pregnancy, unspecified trimester: Secondary | ICD-10-CM

## 2012-04-28 DIAGNOSIS — Z23 Encounter for immunization: Secondary | ICD-10-CM

## 2012-04-28 NOTE — Addendum Note (Signed)
Addended by: Vinnie Langton C on: 04/28/2012 10:31 AM   Modules accepted: Orders

## 2012-04-28 NOTE — Progress Notes (Signed)
Routine visit. No problems. Good FM. She has an u/s sheduled for 05-12-12.

## 2012-05-09 ENCOUNTER — Encounter (HOSPITAL_COMMUNITY): Payer: Self-pay

## 2012-05-09 ENCOUNTER — Inpatient Hospital Stay (HOSPITAL_COMMUNITY)
Admission: AD | Admit: 2012-05-09 | Discharge: 2012-05-14 | DRG: 774 | Disposition: A | Discharge status: Home / Self Care | Payer: Managed Care, Other (non HMO) | Source: Ambulatory Visit | Attending: Obstetrics & Gynecology | Admitting: Obstetrics & Gynecology

## 2012-05-09 DIAGNOSIS — J9589 Other postprocedural complications and disorders of respiratory system, not elsewhere classified: Secondary | ICD-10-CM | POA: Diagnosis not present

## 2012-05-09 DIAGNOSIS — O4290 Premature rupture of membranes, unspecified as to length of time between rupture and onset of labor, unspecified weeks of gestation: Principal | ICD-10-CM | POA: Diagnosis present

## 2012-05-09 DIAGNOSIS — O85 Puerperal sepsis: Secondary | ICD-10-CM | POA: Diagnosis not present

## 2012-05-09 DIAGNOSIS — J80 Acute respiratory distress syndrome: Secondary | ICD-10-CM | POA: Diagnosis not present

## 2012-05-09 DIAGNOSIS — O329XX0 Maternal care for malpresentation of fetus, unspecified, not applicable or unspecified: Secondary | ICD-10-CM | POA: Diagnosis present

## 2012-05-09 DIAGNOSIS — O30009 Twin pregnancy, unspecified number of placenta and unspecified number of amniotic sacs, unspecified trimester: Secondary | ICD-10-CM | POA: Diagnosis present

## 2012-05-09 DIAGNOSIS — O42919 Preterm premature rupture of membranes, unspecified as to length of time between rupture and onset of labor, unspecified trimester: Secondary | ICD-10-CM

## 2012-05-09 DIAGNOSIS — O30049 Twin pregnancy, dichorionic/diamniotic, unspecified trimester: Secondary | ICD-10-CM

## 2012-05-09 DIAGNOSIS — O3090 Multiple gestation, unspecified, unspecified trimester: Secondary | ICD-10-CM | POA: Diagnosis present

## 2012-05-09 LAB — URINALYSIS, ROUTINE W REFLEX MICROSCOPIC
Bilirubin Urine: NEGATIVE
Glucose, UA: NEGATIVE mg/dL
Ketones, ur: NEGATIVE mg/dL
Leukocytes, UA: NEGATIVE
Nitrite: NEGATIVE
Protein, ur: NEGATIVE mg/dL

## 2012-05-09 NOTE — MAU Note (Signed)
Pt G3 P1 at 23.5wks with twins contractions since 2100 with increased discharge x 5 days.  No bleeding.

## 2012-05-10 ENCOUNTER — Encounter (HOSPITAL_COMMUNITY): Payer: Self-pay | Admitting: Anesthesiology

## 2012-05-10 ENCOUNTER — Encounter: Payer: Managed Care, Other (non HMO) | Admitting: Family Medicine

## 2012-05-10 ENCOUNTER — Inpatient Hospital Stay (HOSPITAL_COMMUNITY): Payer: Managed Care, Other (non HMO)

## 2012-05-10 ENCOUNTER — Encounter (HOSPITAL_COMMUNITY): Payer: Self-pay | Admitting: *Deleted

## 2012-05-10 ENCOUNTER — Inpatient Hospital Stay (HOSPITAL_COMMUNITY): Payer: Managed Care, Other (non HMO) | Admitting: Anesthesiology

## 2012-05-10 LAB — WET PREP, GENITAL
Clue Cells Wet Prep HPF POC: NONE SEEN
Trich, Wet Prep: NONE SEEN
Yeast Wet Prep HPF POC: NONE SEEN

## 2012-05-10 LAB — CBC WITH DIFFERENTIAL/PLATELET
Eosinophils Absolute: 0 10*3/uL (ref 0.0–0.7)
Eosinophils Relative: 0 % (ref 0–5)
HCT: 31.5 % — ABNORMAL LOW (ref 36.0–46.0)
Lymphs Abs: 1.8 10*3/uL (ref 0.7–4.0)
MCH: 31.8 pg (ref 26.0–34.0)
MCV: 94.6 fL (ref 78.0–100.0)
Monocytes Absolute: 1.5 10*3/uL — ABNORMAL HIGH (ref 0.1–1.0)
Monocytes Relative: 10 % (ref 3–12)
Platelets: 139 10*3/uL — ABNORMAL LOW (ref 150–400)
RBC: 3.33 MIL/uL — ABNORMAL LOW (ref 3.87–5.11)

## 2012-05-10 LAB — RPR: RPR Ser Ql: NONREACTIVE

## 2012-05-10 LAB — PREPARE RBC (CROSSMATCH)

## 2012-05-10 MED ORDER — AMOXICILLIN 500 MG PO CAPS
500.0000 mg | ORAL_CAPSULE | Freq: Three times a day (TID) | ORAL | Status: DC
  Administered 2012-05-12 (×2): 500 mg via ORAL
  Filled 2012-05-10 (×5): qty 1

## 2012-05-10 MED ORDER — CALCIUM CARBONATE ANTACID 500 MG PO CHEW: 2.0000 | CHEWABLE_TABLET | ORAL | Status: DC | PRN

## 2012-05-10 MED ORDER — MAGNESIUM SULFATE 40 G IN LACTATED RINGERS - SIMPLE
4.0000 g/h | Freq: Once | INTRAVENOUS | Status: AC
  Administered 2012-05-10: 4 g/h via INTRAVENOUS
  Filled 2012-05-10: qty 500

## 2012-05-10 MED ORDER — FENTANYL 2.5 MCG/ML BUPIVACAINE 1/10 % EPIDURAL INFUSION (WH - ANES)
14.0000 mL/h | INTRAMUSCULAR | Status: DC
  Administered 2012-05-10 – 2012-05-12 (×7): 14 mL/h via EPIDURAL
  Filled 2012-05-10 (×8): qty 125

## 2012-05-10 MED ORDER — BETAMETHASONE SOD PHOS & ACET 6 (3-3) MG/ML IJ SUSP
12.0000 mg | Freq: Once | INTRAMUSCULAR | Status: AC
  Administered 2012-05-10: 12 mg via INTRAMUSCULAR
  Filled 2012-05-10: qty 2

## 2012-05-10 MED ORDER — PHENYLEPHRINE 40 MCG/ML (10ML) SYRINGE FOR IV PUSH (FOR BLOOD PRESSURE SUPPORT): 80.0000 ug | PREFILLED_SYRINGE | INTRAVENOUS | Status: DC | PRN

## 2012-05-10 MED ORDER — MAGNESIUM SULFATE 40 G IN LACTATED RINGERS - SIMPLE
3.0000 g/h | INTRAVENOUS | Status: DC
  Administered 2012-05-10 – 2012-05-11 (×2): 3 g/h via INTRAVENOUS
  Filled 2012-05-10 (×2): qty 500

## 2012-05-10 MED ORDER — INDOMETHACIN 50 MG PO CAPS
50.0000 mg | ORAL_CAPSULE | Freq: Once | ORAL | Status: AC
  Administered 2012-05-10: 50 mg via ORAL
  Filled 2012-05-10: qty 1

## 2012-05-10 MED ORDER — DIPHENHYDRAMINE HCL 50 MG/ML IJ SOLN: 12.5000 mg | INTRAMUSCULAR | Status: DC | PRN

## 2012-05-10 MED ORDER — EPHEDRINE 5 MG/ML INJ
10.0000 mg | INTRAVENOUS | Status: DC | PRN
  Filled 2012-05-10: qty 4

## 2012-05-10 MED ORDER — LIDOCAINE HCL (PF) 1 % IJ SOLN
INTRAMUSCULAR | Status: DC | PRN
  Administered 2012-05-10 (×2): 4 mL
  Administered 2012-05-10 (×2): 3 mL
  Administered 2012-05-10: 4 mL

## 2012-05-10 MED ORDER — ACETAMINOPHEN 325 MG PO TABS
650.0000 mg | ORAL_TABLET | ORAL | Status: DC | PRN
  Administered 2012-05-10 – 2012-05-12 (×3): 650 mg via ORAL
  Filled 2012-05-10 (×3): qty 2

## 2012-05-10 MED ORDER — PRENATAL MULTIVITAMIN CH
1.0000 | ORAL_TABLET | Freq: Every day | ORAL | Status: DC
  Administered 2012-05-12: 1 via ORAL
  Filled 2012-05-10: qty 1

## 2012-05-10 MED ORDER — ERYTHROMYCIN BASE 250 MG PO TABS
250.0000 mg | ORAL_TABLET | Freq: Four times a day (QID) | ORAL | Status: DC
  Administered 2012-05-12 (×3): 250 mg via ORAL
  Filled 2012-05-10 (×7): qty 1

## 2012-05-10 MED ORDER — EPHEDRINE 5 MG/ML INJ: 10.0000 mg | INTRAVENOUS | Status: DC | PRN

## 2012-05-10 MED ORDER — SODIUM CHLORIDE 0.9 % IV SOLN
2.0000 g | Freq: Four times a day (QID) | INTRAVENOUS | Status: AC
  Administered 2012-05-10 – 2012-05-11 (×7): 2 g via INTRAVENOUS
  Filled 2012-05-10 (×8): qty 2000

## 2012-05-10 MED ORDER — PHENYLEPHRINE 40 MCG/ML (10ML) SYRINGE FOR IV PUSH (FOR BLOOD PRESSURE SUPPORT)
80.0000 ug | PREFILLED_SYRINGE | INTRAVENOUS | Status: DC | PRN
  Filled 2012-05-10: qty 5

## 2012-05-10 MED ORDER — AMPICILLIN SODIUM 500 MG IJ SOLR
500.0000 mg | Freq: Four times a day (QID) | INTRAMUSCULAR | Status: DC
  Filled 2012-05-10 (×3): qty 500

## 2012-05-10 MED ORDER — LACTATED RINGERS IV SOLN
INTRAVENOUS | Status: DC
  Administered 2012-05-10 (×2): 88 mL/h via INTRAVENOUS
  Administered 2012-05-10: 01:00:00 via INTRAVENOUS
  Administered 2012-05-11: 88 mL/h via INTRAVENOUS
  Administered 2012-05-11: 02:00:00 via INTRAVENOUS
  Administered 2012-05-12: 88 mL/h via INTRAVENOUS
  Administered 2012-05-12: 03:00:00 via INTRAVENOUS
  Administered 2012-05-13: 30 mL/h via INTRAVENOUS

## 2012-05-10 MED ORDER — DOCUSATE SODIUM 100 MG PO CAPS
100.0000 mg | ORAL_CAPSULE | Freq: Every day | ORAL | Status: DC
  Administered 2012-05-12 – 2012-05-14 (×3): 100 mg via ORAL
  Filled 2012-05-10 (×3): qty 1

## 2012-05-10 MED ORDER — MAGNESIUM SULFATE 40 G IN LACTATED RINGERS - SIMPLE
2.0000 g/h | Freq: Once | INTRAVENOUS | Status: AC
  Administered 2012-05-10: 4 g/h via INTRAVENOUS
  Filled 2012-05-10: qty 500

## 2012-05-10 MED ORDER — ZOLPIDEM TARTRATE 5 MG PO TABS: 5.0000 mg | ORAL_TABLET | Freq: Every evening | ORAL | Status: DC | PRN

## 2012-05-10 MED ORDER — LACTATED RINGERS IV SOLN: 500.0000 mL | Freq: Once | INTRAVENOUS | Status: DC

## 2012-05-10 MED ORDER — SODIUM CHLORIDE 0.9 % IV SOLN
250.0000 mg | Freq: Four times a day (QID) | INTRAVENOUS | Status: DC
  Administered 2012-05-10 – 2012-05-11 (×8): 250 mg via INTRAVENOUS
  Filled 2012-05-10 (×8): qty 250

## 2012-05-10 MED ORDER — BETAMETHASONE SOD PHOS & ACET 6 (3-3) MG/ML IJ SUSP
12.0000 mg | Freq: Once | INTRAMUSCULAR | Status: DC
  Filled 2012-05-10: qty 2

## 2012-05-10 NOTE — Progress Notes (Signed)
Julia Levine is a 26 y.o. G3P1011 at [redacted]w[redacted]d admitted for PPROM and contractions  Subjective: Pt feeling more pain.  The contractions have not decreased since starting magnesium sulfate  Objective: BP 115/58  Pulse 134  Temp 99.4 F (37.4 C) (Oral)  Resp 22  Ht 5\' 7"  (1.702 m)  Wt 77.474 kg (170 lb 12.8 oz)  BMI 26.75 kg/m2  SpO2 100%  LMP 11/25/2011   Total I/O In: 395.8 [I.V.:245.8; IV Piggyback:150] Out: -   FHT:  BPP just now 6/8.  There was not sustained breathing.  Continuous tracings are difficult but 2 RNs are bedside working on keeping the babies on the monitor.  Vtx/Vtx with baby B slightly off at an angle. Labs: Lab Results  Component Value Date   WBC 14.1* 05/10/2012   HGB 10.6* 05/10/2012   HCT 31.5* 05/10/2012   MCV 94.6 05/10/2012   PLT 139* 05/10/2012    Assessment / Plan: 26 yo G2P1001 at 23 weeks and 6 days with PPROM and preterm labor. Betamethasone was given in MAU Increase magnesium sulfate to 3 gm / hr Add indomethacin Trendelenburg Continue amp and erythro NICU and OR aware of her status. Will plan for NSVD of twins, but c/s may needed if Baby B lie becomes unstable.  Julia Levine H. 05/10/2012, 3:08 AM

## 2012-05-10 NOTE — Progress Notes (Signed)
Julia Levine is a 26 y.o. G3P1011 at [redacted]w[redacted]d.  Subjective: Mildly increased rectal pressure from last note.  Objective: BP 101/54  Pulse 112  Temp 98.1 F (36.7 C) (Oral)  Resp 20  Ht 5\' 7"  (1.702 m)  Wt 77.474 kg (170 lb 12.8 oz)  BMI 26.75 kg/m2  SpO2 99%  LMP 11/25/2011 I/O last 3 completed shifts: In: 1156.9 [P.O.:100; I.V.:906.9; IV Piggyback:150] Out: 950 [Urine:950] Total I/O In: 933.8 [P.O.:150; I.V.:683.8; IV Piggyback:100] Out: 900 [Urine:900]  FHT:  FHR: A: 125 bpm, variability: moderate,  accelerations:  Abscent,  decelerations:  Present few mild varibles. One severe varable x 80 sec. Good recovery. B; Baseline 125, moderate variability, pos acels, no decles.  UC:   Irreg, moderate SVE:   Deferred  Labs: Lab Results  Component Value Date   WBC 14.1* 05/10/2012   HGB 10.6* 05/10/2012   HCT 31.5* 05/10/2012   MCV 94.6 05/10/2012   PLT 139* 05/10/2012    Assessment / Plan: PPROM, attempting to prolonging latency  Labor: Progressing normally Preeclampsia:  NA Fetal Wellbeing:  Category II Pain Control:  Epidural I/D:  n/a Anticipated MOD:  NSVD Will monitor FHR closely. Dr. Debroah Loop aware of decels.  Julia Levine 05/10/2012, 12:05 PM

## 2012-05-10 NOTE — Anesthesia Procedure Notes (Signed)
Epidural Patient location during procedure: OB Start time: 05/10/2012 3:40 AM  Staffing Performed by: anesthesiologist   Preanesthetic Checklist Completed: patient identified, site marked, surgical consent, pre-op evaluation, timeout performed, IV checked, risks and benefits discussed and monitors and equipment checked  Epidural Patient position: left lateral decubitus Prep: site prepped and draped and DuraPrep Patient monitoring: continuous pulse ox and blood pressure Approach: midline Injection technique: LOR air  Needle:  Needle type: Tuohy  Needle gauge: 17 G Needle length: 9 cm and 9 Needle insertion depth: 5 cm cm Catheter type: closed end flexible Catheter size: 19 Gauge Catheter at skin depth: 10 cm Test dose: negative  Assessment Events: blood not aspirated, injection not painful, no injection resistance, negative IV test and no paresthesia  Additional Notes Discussed risk of headache, infection, bleeding, nerve injury and failed or incomplete block.  Patient voices understanding and wishes to proceed.

## 2012-05-10 NOTE — Consult Note (Signed)
Antenatal Consult:  Requested by Dr. Penne Lash for an antenatal consult with [name] in Room 171.  She is at 54 6/7 weeks Twin gestation and was admitted in active labor.  Went to Room 171 at around 0340 to speak to Ms. Clark but she was unavailable since an epidural was being placed by anesthesia.    Overton Mam, MD (Attending Neonatologist)

## 2012-05-10 NOTE — Progress Notes (Signed)
Julia Levine is a 26 y.o. G3P1011 at 23w6 admitted for preterm labor.  Subjective: Patient without complaints.  Admits to only periodic abdominal pain, which feels like she needs to have a BM.  These happen very irregularly, less often than once per hour.  Patient voiced a good understanding of the plan and reasoning behind lengthening her labor.  Objective: BP 106/47  Pulse 110  Temp 98.2 F (36.8 C) (Oral)  Resp 18  Ht 5\' 7"  (1.702 m)  Wt 77.474 kg (170 lb 12.8 oz)  BMI 26.75 kg/m2  SpO2 97%  LMP 11/25/2011 I/O last 3 completed shifts: In: 1156.9 [P.O.:100; I.V.:906.9; IV Piggyback:150] Out: 950 [Urine:950] Total I/O In: 1865.5 [P.O.:300; I.V.:1315.5; IV Piggyback:250] Out: 2125 [Urine:2125]  FHT:  FHR: 125-130 bpm, variability: moderate,  accelerations:  Present,  decelerations:  Present Baby A- 1 decel of 15bpm for , resolved and has not recurred.  Baby B- two variable decels less than 2 mins each, not recurred. UC:   none SVE: deferred, patient fully dilated.  Labs: Lab Results  Component Value Date   WBC 14.1* 05/10/2012   HGB 10.6* 05/10/2012   HCT 31.5* 05/10/2012   MCV 94.6 05/10/2012   PLT 139* 05/10/2012    Assessment / Plan: 26yoF G3P1011 at [redacted]w[redacted]d with twins, presenting in pre-term labor, attempting to prolong labor.  Labor: Prolonging labor to ensure viability Preeclampsia:  none Fetal Wellbeing:  Category II Pain Control:  Fentanyl I/D:  n/a Anticipated MOD:  Prolonging labor- Mg, BMZ tonight, Amp/Erythromycin, Indocin.  NICU on board and beds available for babies after delivery.  Sonia Side 05/10/2012, 6:26 PM

## 2012-05-10 NOTE — Anesthesia Preprocedure Evaluation (Signed)
Anesthesia Evaluation  Patient identified by MRN, date of birth, ID band Patient awake    Reviewed: Allergy & Precautions, H&P , NPO status , Patient's Chart, lab work & pertinent test results, reviewed documented beta blocker date and time   History of Anesthesia Complications Negative for: history of anesthetic complications  Airway Mallampati: II TM Distance: >3 FB Neck ROM: full    Dental  (+) Teeth Intact   Pulmonary neg pulmonary ROS,  breath sounds clear to auscultation        Cardiovascular negative cardio ROS  Rhythm:regular Rate:Normal     Neuro/Psych negative neurological ROS  negative psych ROS   GI/Hepatic negative GI ROS, Neg liver ROS,   Endo/Other  negative endocrine ROS  Renal/GU negative Renal ROS     Musculoskeletal   Abdominal   Peds  Hematology negative hematology ROS (+)   Anesthesia Other Findings   Reproductive/Obstetrics (+) Pregnancy (23 w6d twins, SROM)                           Anesthesia Physical Anesthesia Plan  ASA: II  Anesthesia Plan: Epidural   Post-op Pain Management:    Induction:   Airway Management Planned:   Additional Equipment:   Intra-op Plan:   Post-operative Plan:   Informed Consent: I have reviewed the patients History and Physical, chart, labs and discussed the procedure including the risks, benefits and alternatives for the proposed anesthesia with the patient or authorized representative who has indicated his/her understanding and acceptance.     Plan Discussed with:   Anesthesia Plan Comments:         Anesthesia Quick Evaluation

## 2012-05-10 NOTE — Consult Note (Signed)
Antenatal Consult:  Spoke with [name] and FOB in Room 171 this morning.  She is a 26 y/o G3P1 mother admitted early this morning at 30 6/7 weeks Twin gestation for PPROM and PTL.  MOB presented in active labor in MAU and has been having white watery vaginal discharge since Thursday (Oct. 2nd) but denies leaking or gush of fluid.  She was started on Ampicillin and Erythromycin and received a dose of Bethamethasone in MAU. She is now completely dilated and on Magnesium and Indomethacin prophylaxis and remains on Trendelenburg position. Discussed with [name] and FOB the morbidity and mortality of twin boys born between 23-[redacted] weeks gestation. Informed them of the possible complications including RDS requiring intubation and surfactant treatment, PDA, IVH, sepsis as well as need for umbilical line placement for IV access and blood drawing.  MOB does not want to breastfeed but I encouraged her to pump and informed her that breast milk is tolerated better by premature infants.  Emphasized that twins born between 23-24 weeks will be very critical and survival rate is lower compared to a singleton. Both parents understand and asked appropriiate questions.  They were concerned that the twins may have to be transferred out to another facility because of our NICU census/bed situation.  I informed them that I have already made arrangements to admit the twins to our NICU since I expect that they will be quite unstable right after birth with no plans for transfer at present time.  They were very pleased with my answer and appreciate that the twins are being accommodated in our NICU.    Thank you for the consult and we will be available when the twins are ready to deliver.   Overton Mam, MD (Attending Neonatologist)  Consult Time: 30 minutes

## 2012-05-10 NOTE — H&P (Signed)
Julia Levine is a 26 y.o. female [phone] [redacted]w[redacted]d twin gestation presenting for abdominal pain, which she believes are contractions, that started tonight about 9:30pm. She noticed an increase amount of white, watery vaginal discharge that started on Thursday. She denies fever, leaking or gush of fluids. No vaginal bleeding. She had felt her babies move this afternoon. She denies complications with this pregnancy. Her last baby was delivered at 38 weeks and was in 2010 with a 6lb 6oz infant.  Maternal Medical History:  Reason for admission: Reason for admission: rupture of membranes.  Reason for Admission:   nauseaContractions: Onset was 1-2 hours ago.   Frequency: regular.   Duration is approximately 5 minutes.   Perceived severity is mild.      OB History    Grav Para Term Preterm Abortions TAB SAB Ect Mult Living   3 1 1  1  1   1      Past Medical History  Diagnosis Date  . 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 Colposcopy 10/2011    abnormal pap HPV   Family History: family history includes Hypertension in her mother. Social History:  reports that she has never smoked. She does not have any smokeless tobacco history on file. She reports that she does not drink alcohol or use illicit drugs.   Prenatal Transfer Tool  Maternal Diabetes: No Genetic Screening: Normal Maternal Ultrasounds/Referrals: Normal Fetal Ultrasounds or other Referrals: unknown Maternal Substance Abuse:  No Significant Maternal Medications:  None Significant Maternal Lab Results:  None Other Comments:  None  Review of Systems  Constitutional: Negative for fever and chills.  Eyes: Negative for blurred vision and double vision.  Respiratory: Negative for cough, shortness of breath and wheezing.   Cardiovascular: Negative for chest pain and palpitations.  Gastrointestinal: Positive for abdominal pain. Negative for nausea, vomiting and diarrhea.  Genitourinary:  Negative for dysuria, urgency and frequency.  Skin: Negative for rash.  Neurological: Negative for dizziness, weakness and headaches.      Blood pressure 121/62, pulse 110, temperature 98 F (36.7 C), temperature source Oral, resp. rate 18, height 5\' 7"  (1.702 m), weight 77.474 kg (170 lb 12.8 oz), last menstrual period 11/25/2011. Maternal Exam:  Uterine Assessment: Contraction strength is moderate.  Contraction duration is 1 minute. Contraction frequency is regular.   Introitus: Amniotic fluid character: clear.  Cervix: Cervix evaluated by sterile speculum exam.    FHR: 150's for baby A, Baby B 150's Physical Exam  Constitutional: She is oriented to person, place, and time. She appears well-developed and well-nourished. No distress.  HENT:  Head: Normocephalic and atraumatic.  Eyes: EOM are normal. Pupils are equal, round, and reactive to light. Right eye exhibits no discharge. Left eye exhibits no discharge. No scleral icterus.  Cardiovascular: Normal rate, regular rhythm and normal heart sounds.   No murmur heard. Respiratory: Effort normal and breath sounds normal. No respiratory distress. She has no wheezes. She has no rales.  GI: Soft. She exhibits no distension and no mass. There is no tenderness. There is no rebound and no guarding.  Genitourinary: Vagina normal.        External: normal Vagina: Pooling of clear fluid seen Cervix: visually closed, deferred bimanual exam 2/2 suspected PPROM Uterus: nontender, Fundal height AGA  Musculoskeletal: She exhibits no edema and no tenderness.  Neurological: She is alert and oriented to person, place, and time.  Skin: Skin is warm and dry.  No rash noted. She is not diaphoretic.  Psychiatric: She has a normal mood and affect.     Results for orders placed during the hospital encounter of 05/09/12 (from the past 24 hour(s))  URINALYSIS, ROUTINE W REFLEX MICROSCOPIC     Status: Normal   Collection Time   05/09/12 11:45 PM       Component Value Range   Color, Urine YELLOW  YELLOW   APPearance CLEAR  CLEAR   Specific Gravity, Urine 1.010  1.005 - 1.030   pH 7.0  5.0 - 8.0   Glucose, UA NEGATIVE  NEGATIVE mg/dL   Hgb urine dipstick NEGATIVE  NEGATIVE   Bilirubin Urine NEGATIVE  NEGATIVE   Ketones, ur NEGATIVE  NEGATIVE mg/dL   Protein, ur NEGATIVE  NEGATIVE mg/dL   Urobilinogen, UA 0.2  0.0 - 1.0 mg/dL   Nitrite NEGATIVE  NEGATIVE   Leukocytes, UA NEGATIVE  NEGATIVE  AMNISURE RUPTURE OF MEMBRANE (ROM)     Status: Normal   Collection Time   05/10/12 12:15 AM      Component Value Range   Amnisure ROM POSITIVE    FETAL FIBRONECTIN     Status: Abnormal   Collection Time   05/10/12 12:17 AM      Component Value Range   Fetal Fibronectin POSITIVE (*) NEGATIVE  WET PREP, GENITAL     Status: Abnormal   Collection Time   05/10/12 12:17 AM      Component Value Range   Yeast Wet Prep HPF POC NONE SEEN  NONE SEEN   Trich, Wet Prep NONE SEEN  NONE SEEN   Clue Cells Wet Prep HPF POC NONE SEEN  NONE SEEN   WBC, Wet Prep HPF POC FEW (*) NONE SEEN  CBC WITH DIFFERENTIAL     Status: Abnormal   Collection Time   05/10/12 12:20 AM      Component Value Range   WBC 14.1 (*) 4.0 - 10.5 K/uL   RBC 3.33 (*) 3.87 - 5.11 MIL/uL   Hemoglobin 10.6 (*) 12.0 - 15.0 g/dL   HCT 40.9 (*) 81.1 - 91.4 %   MCV 94.6  78.0 - 100.0 fL   MCH 31.8  26.0 - 34.0 pg   MCHC 33.7  30.0 - 36.0 g/dL   RDW 78.2  95.6 - 21.3 %   Platelets 139 (*) 150 - 400 K/uL   Neutrophils Relative 77  43 - 77 %   Neutro Abs 10.8 (*) 1.7 - 7.7 K/uL   Lymphocytes Relative 13  12 - 46 %   Lymphs Abs 1.8  0.7 - 4.0 K/uL   Monocytes Relative 10  3 - 12 %   Monocytes Absolute 1.5 (*) 0.1 - 1.0 K/uL   Eosinophils Relative 0  0 - 5 %   Eosinophils Absolute 0.0  0.0 - 0.7 K/uL   Basophils Relative 0  0 - 1 %   Basophils Absolute 0.0  0.0 - 0.1 K/uL    Prenatal labs: ABO, Rh: A/--/-- (06/26 0865) Antibody: Negative (06/26 0928) Rubella:   Immune RPR:    Non-reactive HBsAg: Negative (06/26 0928)  HIV:   negative GBS:   unknown  Assessment/Plan:Candid Prudencio Burly is a 26 y.o. female G74P1011 [redacted]w[redacted]d twin gestation presented for contractions and PPROM. 1. PPROM:  - Ampicillin/Amoxicillin/erythomycin  - Betamethasone  - Magnesium Sulfate; every 2 hour note 2. Transfer to L&D with Labor and delivery orders 3. Portable US 4. NICU informed 5. Anticipate preterm vaginal delivery  Tawnya Crook 05/10/2012, 12:46 AM

## 2012-05-10 NOTE — Progress Notes (Signed)
CHINITA SCHIMPF is a 26 y.o. G3P1011 at [redacted]w[redacted]d  Subjective: Increased rectal pressure  Objective: BP 104/60  Pulse 97  Temp 98.1 F (36.7 C) (Oral)  Resp 20  Ht 5\' 7"  (1.702 m)  Wt 77.474 kg (170 lb 12.8 oz)  BMI 26.75 kg/m2  SpO2 97%  LMP 11/25/2011 I/O last 3 completed shifts: In: 1156.9 [P.O.:100; I.V.:906.9; IV Piggyback:150] Out: 950 [Urine:950] Total I/O In: 778.3 [P.O.:120; I.V.:558.3; IV Piggyback:100] Out: 700 [Urine:700]  FHT:  FHR: A: 130 bpm, variability: moderate,  accelerations:  Abscent,  decelerations:  Present few mild varibles. B: 130's, intermittent due to maternal position. Moderate variability  UC:   irregular, moderate SVE:   Dilation: 10 Effacement (%): 100 Station: 0 Exam by:: Fausto Sampedro, CNM Moderate bloody show mixed w/ fluid, soft BBOW, ballotable.   Labs: Lab Results  Component Value Date   WBC 14.1* 05/10/2012   HGB 10.6* 05/10/2012   HCT 31.5* 05/10/2012   MCV 94.6 05/10/2012   PLT 139* 05/10/2012    Assessment / Plan: PPROM/PTL, attempting to lengthen letent period for max benefit of BMZ.  Labor: Progressing normally Preeclampsia:  NA Fetal Wellbeing:  Category II Pain Control:  Epidural I/D:  n/a Anticipated MOD:  NSVD Discussed possible need for emergent C/S, borderline viability of babies. Asked pt and family if they would agree to precede w/ C/S to save lives of babies. Weighed against risks of emergency surgery, possible classical incision leading to increased risk of uterine rupture in future pregnancies and need for delivery at 36 weeks by repeat C/S. Pt verbalized understanding, consents to C/S if needed for NRFHTs or difficulty w/ delivery. Will otherwise plan vaginal delivery. Dr. Debroah Loop at [facility], reiterated pros and cons.  In consideration of high likelihood of delivery before next BMZ dose at 0000, will plan second dose at 1200.   Dorathy Kinsman 05/10/2012, 09:26 AM

## 2012-05-10 NOTE — H&P (Signed)
Pt seen and examined in triage.  PPROM confirmed.  Magnesium sulfate and betamethasone started in triage.  Moved to L&D for further management.  Rebecah Dangerfield H.

## 2012-05-11 LAB — MAGNESIUM: Magnesium: 5.3 mg/dL — ABNORMAL HIGH (ref 1.5–2.5)

## 2012-05-11 MED ORDER — DIPHENHYDRAMINE HCL 50 MG/ML IJ SOLN
INTRAMUSCULAR | Status: AC
  Administered 2012-05-11: 50 mg
  Filled 2012-05-11: qty 1

## 2012-05-11 MED ORDER — OXYTOCIN 40 UNITS IN LACTATED RINGERS INFUSION - SIMPLE MED
INTRAVENOUS | Status: AC
  Filled 2012-05-11: qty 1000

## 2012-05-11 MED ORDER — FUROSEMIDE 10 MG/ML IJ SOLN
20.0000 mg | Freq: Once | INTRAMUSCULAR | Status: AC
  Administered 2012-05-11: 20 mg via INTRAVENOUS
  Filled 2012-05-11: qty 2

## 2012-05-11 NOTE — Progress Notes (Signed)
Called to evaluate patient with decrease O2 sat and new onset of a cough. Patient is lying in bed comfortably and is without any other complaints other than a non-productive cough. Patient denies any cramping and contractions and reports good fetal movement. Patient denies chest pain or shortness of breath.  BP 119/41 P 110 RR 24  O2 ranging from 87-96  Lungs: mild crackles at lung bases bilaterally Heart: RRR Abd: soft, gravid, NT Pelvic exam- deferred  NST: baseline 130, mod variability, +accels, no decels Toco: irregular contractions  A/P 26 yo G3P1011 with di-di twins s/p SVD of twin A, s/p magnesium sulfate with likely mild pulmonary edema - Will obtain a magnesium level - continue fluid restriction - Continue O2 supplementation - Continue close monitoring

## 2012-05-11 NOTE — Progress Notes (Signed)
Pt reports coughing with SOB. Lung sounds clear bilaterally. resp 20. Dr Jolayne Panther updated.

## 2012-05-11 NOTE — Progress Notes (Signed)
Delivery Note for Twin A Called to evaluate patient with imminent delivery of [redacted] week GA twins. At 2:28 PM a viable female (Twin A) was delivered via Vaginal, Spontaneous Delivery (Presentation: cepahlic; Occiput Anterior).  APGAR: 5, 7; weight 1 lb 9.4 oz (720 g).  Placenta status: retained, not delivered, 3 vessel cord was cut and tied off with 3-0 Vicryl suture, end tucked into vagina.  Twin B was noted to be at -4 to -5 station, breech presentation, intact membranes.  Stable FHR on monitor, no signs of chorioamnionitis, no signs of abruption or any maternal-fetal distress.  Therefore, there was no reason to hasten delivery of Twin B.  Patient was told that labor can progress spontaneously soon, or delivery may be indicated for any of the aforementioned situations.   Anesthesia: Epidural  Episiotomy: None Lacerations: None Est. Blood Loss (mL): 150  Mom remained on L&D for further close monitoring.  Baby to NICU.  Jaynie Collins, MD, FACOG Attending Obstetrician & Gynecologist Faculty Practice, [facility]

## 2012-05-11 NOTE — Progress Notes (Signed)
At 1630, at bedside, pt complains of SOB and is tearful, with productive cough. Lung sounds clear bilaterally, respirations 24. Skin color WNL. Dr. Jolayne Panther updated on pt status. Orders received.

## 2012-05-12 ENCOUNTER — Encounter (HOSPITAL_COMMUNITY): Payer: Self-pay | Admitting: Radiology

## 2012-05-12 ENCOUNTER — Ambulatory Visit (HOSPITAL_COMMUNITY): Admission: RE | Admit: 2012-05-12 | Payer: Managed Care, Other (non HMO) | Source: Ambulatory Visit

## 2012-05-12 ENCOUNTER — Inpatient Hospital Stay (HOSPITAL_COMMUNITY): Payer: Managed Care, Other (non HMO)

## 2012-05-12 DIAGNOSIS — J80 Acute respiratory distress syndrome: Secondary | ICD-10-CM | POA: Diagnosis not present

## 2012-05-12 DIAGNOSIS — O85 Puerperal sepsis: Secondary | ICD-10-CM

## 2012-05-12 DIAGNOSIS — J9589 Other postprocedural complications and disorders of respiratory system, not elsewhere classified: Secondary | ICD-10-CM

## 2012-05-12 DIAGNOSIS — O4290 Premature rupture of membranes, unspecified as to length of time between rupture and onset of labor, unspecified weeks of gestation: Secondary | ICD-10-CM

## 2012-05-12 LAB — CBC WITH DIFFERENTIAL/PLATELET
Basophils Absolute: 0 10*3/uL (ref 0.0–0.1)
HCT: 28.3 % — ABNORMAL LOW (ref 36.0–46.0)
Lymphocytes Relative: 9 % — ABNORMAL LOW (ref 12–46)
Lymphs Abs: 0.8 10*3/uL (ref 0.7–4.0)
Monocytes Absolute: 0.5 10*3/uL (ref 0.1–1.0)
Neutro Abs: 7.7 10*3/uL (ref 1.7–7.7)
RBC: 2.83 MIL/uL — ABNORMAL LOW (ref 3.87–5.11)
RDW: 13.1 % (ref 11.5–15.5)
WBC: 9.1 10*3/uL (ref 4.0–10.5)

## 2012-05-12 LAB — BASIC METABOLIC PANEL
CO2: 22 mEq/L (ref 19–32)
Chloride: 101 mEq/L (ref 96–112)
Creatinine, Ser: 0.37 mg/dL — ABNORMAL LOW (ref 0.50–1.10)
Sodium: 135 mEq/L (ref 135–145)

## 2012-05-12 MED ORDER — PIPERACILLIN-TAZOBACTAM 3.375 G IVPB
3.3750 g | Freq: Three times a day (TID) | INTRAVENOUS | Status: DC
  Administered 2012-05-12 – 2012-05-13 (×3): 3.375 g via INTRAVENOUS
  Filled 2012-05-12 (×4): qty 50

## 2012-05-12 MED ORDER — IOHEXOL 350 MG/ML SOLN
100.0000 mL | Freq: Once | INTRAVENOUS | Status: AC | PRN
  Administered 2012-05-12: 100 mL via INTRAVENOUS

## 2012-05-12 MED ORDER — ZOLPIDEM TARTRATE 5 MG PO TABS: 5.0000 mg | ORAL_TABLET | Freq: Every evening | ORAL | Status: DC | PRN

## 2012-05-12 MED ORDER — SIMETHICONE 80 MG PO CHEW: 80.0000 mg | CHEWABLE_TABLET | ORAL | Status: DC | PRN

## 2012-05-12 MED ORDER — FUROSEMIDE 10 MG/ML IJ SOLN
20.0000 mg | Freq: Once | INTRAMUSCULAR | Status: AC
  Administered 2012-05-12: 20 mg via INTRAVENOUS
  Filled 2012-05-12: qty 2

## 2012-05-12 MED ORDER — DIBUCAINE 1 % RE OINT: 1.0000 "application " | TOPICAL_OINTMENT | RECTAL | Status: DC | PRN

## 2012-05-12 MED ORDER — TETANUS-DIPHTH-ACELL PERTUSSIS 5-2.5-18.5 LF-MCG/0.5 IM SUSP
0.5000 mL | Freq: Once | INTRAMUSCULAR | Status: AC
  Administered 2012-05-13: 0.5 mL via INTRAMUSCULAR
  Filled 2012-05-12: qty 0.5

## 2012-05-12 MED ORDER — IBUPROFEN 600 MG PO TABS
600.0000 mg | ORAL_TABLET | Freq: Four times a day (QID) | ORAL | Status: DC
  Administered 2012-05-13 – 2012-05-14 (×6): 600 mg via ORAL
  Filled 2012-05-12 (×8): qty 1

## 2012-05-12 MED ORDER — OXYCODONE-ACETAMINOPHEN 5-325 MG PO TABS: 1.0000 | ORAL_TABLET | ORAL | Status: DC | PRN

## 2012-05-12 MED ORDER — WITCH HAZEL-GLYCERIN EX PADS: 1.0000 "application " | MEDICATED_PAD | CUTANEOUS | Status: DC | PRN

## 2012-05-12 MED ORDER — DIPHENHYDRAMINE HCL 25 MG PO CAPS: 25.0000 mg | ORAL_CAPSULE | Freq: Four times a day (QID) | ORAL | Status: DC | PRN

## 2012-05-12 MED ORDER — ONDANSETRON HCL 4 MG PO TABS: 4.0000 mg | ORAL_TABLET | ORAL | Status: DC | PRN

## 2012-05-12 MED ORDER — PRENATAL MULTIVITAMIN CH
1.0000 | ORAL_TABLET | Freq: Every day | ORAL | Status: DC
  Administered 2012-05-13 – 2012-05-14 (×2): 1 via ORAL
  Filled 2012-05-12 (×2): qty 1

## 2012-05-12 MED ORDER — ONDANSETRON HCL 4 MG/2ML IJ SOLN: 4.0000 mg | INTRAMUSCULAR | Status: DC | PRN

## 2012-05-12 MED ORDER — PRENATAL MULTIVITAMIN CH: 1.0000 | ORAL_TABLET | Freq: Every morning | ORAL | Status: DC

## 2012-05-12 MED ORDER — SENNOSIDES-DOCUSATE SODIUM 8.6-50 MG PO TABS
2.0000 | ORAL_TABLET | Freq: Every day | ORAL | Status: DC
  Administered 2012-05-13: 2 via ORAL

## 2012-05-12 MED ORDER — OXYTOCIN 40 UNITS IN LACTATED RINGERS INFUSION - SIMPLE MED
62.5000 mL/h | INTRAVENOUS | Status: DC
  Administered 2012-05-12: 62.5 mL/h via INTRAVENOUS

## 2012-05-12 MED ORDER — OXYTOCIN 40 UNITS IN LACTATED RINGERS INFUSION - SIMPLE MED
INTRAVENOUS | Status: AC
  Filled 2012-05-12: qty 1000

## 2012-05-12 MED ORDER — LANOLIN HYDROUS EX OINT: TOPICAL_OINTMENT | CUTANEOUS | Status: DC | PRN

## 2012-05-12 MED ORDER — OXYTOCIN 10 UNIT/ML IJ SOLN
INTRAMUSCULAR | Status: AC
  Filled 2012-05-12: qty 1

## 2012-05-12 MED ORDER — BENZOCAINE-MENTHOL 20-0.5 % EX AERO: 1.0000 "application " | INHALATION_SPRAY | CUTANEOUS | Status: DC | PRN

## 2012-05-12 MED ORDER — LIDOCAINE HCL (PF) 1 % IJ SOLN
INTRAMUSCULAR | Status: AC
  Filled 2012-05-12: qty 30

## 2012-05-12 NOTE — Progress Notes (Signed)
   Julia Levine is a 26 y.o. G3P1011 at [redacted]w[redacted]d  admitted for Preterm labor Twin A delivered ~ 24 hours ago.  Twin B still in with intact membranes Subjective: No SOB, dyspnea. Has working epidural  Objective: BP 107/47  Pulse 99  Temp 98.6 F (37 C) (Axillary)  Resp 20  Ht 5\' 7"  (1.702 m)  Wt 77.474 kg (170 lb 12.8 oz)  BMI 26.75 kg/m2  SpO2 95%  LMP 11/25/2011    FHT:  FHR: 150 bpm, variability: minimal ,  accelerations:  Present,  decelerations:  Present occ mild variable UC:   rare SVE:   deferred Lungs:  Rare crackles Labs: Lab Results  Component Value Date   WBC 14.1* 05/10/2012   HGB 10.6* 05/10/2012   HCT 31.5* 05/10/2012   MCV 94.6 05/10/2012   PLT 139* 05/10/2012    Assessment / Plan: PTL with PTD of twin A 24 hours ago Pulmonary edema, improving Added 20 Lasix x1 Expectant management   Julia Levine,Julia Levine 05/12/2012, 7:59 AM

## 2012-05-12 NOTE — Progress Notes (Signed)
ANTIBIOTIC CONSULT NOTE - INITIAL  Pharmacy Consult for Zosyn Indication: Postpartum Fever/Infection  No Known Allergies  Patient Measurements: Height: 5\' 7"  (170.2 cm) Weight: 170 lb 12.8 oz (77.474 kg) IBW/kg (Calculated) : 61.6    Vital Signs: Temp: 98.7 F (37.1 C) (10/10 1931) Temp src: Oral (10/10 1931) BP: 120/62 mmHg (10/10 1946) Pulse Rate: 94  (10/10 1947)   Labs:  Basename 05/10/12 0020  WBC 14.1*  HGB 10.6*  PLT 139*  LABCREA --  CREATININE --   Estimated Creatinine Clearance: 115.4 ml/min (by C-G formula based on Cr of 0.6). Microbiology: Recent Results (from the past 720 hour(s))  URINE CULTURE     Status: Normal   Collection Time   04/22/12  1:25 PM      Component Value Range Status Comment   Specimen Description OB CLEAN CATCH   Final    Special Requests NONE   Final    Culture  Setup Time 04/23/2012 01:31   Final    Colony Count >=100,000 COLONIES/ML   Final    Culture     Final    Value: Multiple bacterial morphotypes present, none predominant. Suggest appropriate recollection if clinically indicated.   Report Status 04/24/2012 FINAL   Final   WET PREP, GENITAL     Status: Abnormal   Collection Time   04/22/12  3:55 PM      Component Value Range Status Comment   Yeast Wet Prep HPF POC NONE SEEN  NONE SEEN Final    Trich, Wet Prep NONE SEEN  NONE SEEN Final    Clue Cells Wet Prep HPF POC NONE SEEN  NONE SEEN Final    WBC, Wet Prep HPF POC FEW (*) NONE SEEN Final MODERATE BACTERIA SEEN  WET PREP, GENITAL     Status: Abnormal   Collection Time   05/10/12 12:17 AM      Component Value Range Status Comment   Yeast Wet Prep HPF POC NONE SEEN  NONE SEEN Final    Trich, Wet Prep NONE SEEN  NONE SEEN Final    Clue Cells Wet Prep HPF POC NONE SEEN  NONE SEEN Final    WBC, Wet Prep HPF POC FEW (*) NONE SEEN Final FEW BACTERIA SEEN    Medical History: Past Medical History  Diagnosis Date  . 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 Pt has developed postpartum temp of 102.7 and increased SOB.  Plan:  1. Zosyn 3.375 gram IV q8h (4 hr infusion). 2. Will draw SCr if verify renal function. 3. Based on CrCl. Will continue current dose. No further f/u necessary. Claybon Jabs 05/12/2012,9:45 PM

## 2012-05-12 NOTE — Progress Notes (Signed)
UR chart review completed.  

## 2012-05-12 NOTE — Progress Notes (Signed)
Attending Note  Julia Levine is a 26 y.o. G3P1011 at [redacted]w[redacted]d  admitted for Preterm labor Twin A delivered > 24 hours ago.  Twin B still in with intact membranes Subjective: No current SOB, dyspnea.  Epidural turned off.  Reported possible LOF, wants to make sure Twin B is okay. No contractions, no bleeding. Good FM. No fevers, no abdominal pain.  Objective: BP 129/57  Pulse 106  Temp 98.4 F (36.9 C) (Axillary)  Resp 20  Ht 5\' 7"  (1.702 m)  Wt 77.474 kg (170 lb 12.8 oz)  BMI 26.75 kg/m2  SpO2 93%  LMP 11/25/2011 Total I/O In: 1127.5 [P.O.:420; I.V.:705.5; IV Piggyback:2] Out: 2750 [Urine:2750]  FHT:  FHR: 150 bpm, variability: minimal ,  accelerations:  Present,  decelerations:  Present occasional mild variable UC:   rare Lungs:  Rare crackles SVE:   BBOW/ high fetal presentation, not able to palpate fetal parts Bedside u/s : Breech presentation  Labs: Lab Results  Component Value Date   WBC 14.1* 05/10/2012   HGB 10.6* 05/10/2012   HCT 31.5* 05/10/2012   MCV 94.6 05/10/2012   PLT 139* 05/10/2012    Assessment / Plan: PTL with PTD of twin A over 24 hours ago. No evidence of progressing PTL for Twin B. No signs or symptoms of chorioamnionitis, will continue to monitor closely Pulmonary edema, improving after Lasix and magnesium sulfate discontinuation Continue expectant management  Jaynie Collins, MD, FACOG Attending Obstetrician & Gynecologist Faculty Practice, [facility] [place]

## 2012-05-12 NOTE — Progress Notes (Signed)
05/12/12 1300  Clinical Encounter Type  Visited With Patient and family together (Babies' stepgrandma)  Visit Type Initial;Spiritual support;Social support  Referral From Nurse (Erven Colla, RN)  Recommendations Spiritual Care will follow for spiritual and emotional support.  Spiritual Encounters  Spiritual Needs Emotional  Stress Factors  Patient Stress Factors Major life changes    Julia Levine was in good spirits during this initial visit, feeling great about baby A's delivery and about whole family's care as she waits for baby B's birth.  Baby's heart rate dipped while I was there, and family was pleased with and grateful for RN's and MD's quick responses.  They feel in good hands.  Strong family support.  Julia Levine got a little tearful when talking about missing her daughter, 3, who is in care of pt's sister and aunt while pt here.  Provided opportunity to share and process her story.  Family appreciative and aware of ongoing chaplain availability.  Spiritual Care will continue to follow for spiritual and emotional support.  [address] [place], [place] [phone]

## 2012-05-12 NOTE — Progress Notes (Signed)
Attending Progress Note  Called to evaluate patient with oxygen saturations in 80s, chest pain, increased SOB.  On evaluation, pulse was in the 140s, oxygen saturation was in the 80s, and patient was laboring to breathe.  Temp 102.7.  Lungs with significant rales all the way up on the right, basilar on the left. Heart: Tachycardic, regular rhythm  Lasix 20 mg IV x 1 and Oxygen 10 L/min were given which improved her O2 sats, EKG done showed sinus tachycardia, portable CXR done.  CT Chest ordered to evaluate for PE.   Zosyn ordered per pharmacy protocol, will check CBC with differential, urine and blood cultures. Will follow up results and manage accordingly.  Patient to be observed in AICU given concern for respiratory difficulties, possible PE, and sepsis.  Normal lochia, no concerns about postpartum hemorrhage or other obstetric concerns.  Also alerted Critical Care Medicine about patient's situation, they recommended checking BNP, 2-D ECHO. Continue close observation in the AICU.  Jaynie Collins, MD, FACOG Attending Obstetrician & Gynecologist Faculty Practice, [facility] [place]

## 2012-05-12 NOTE — Progress Notes (Signed)
Incentive spirometry every hour. Turn cough and deep breathe each hour.

## 2012-05-12 NOTE — Progress Notes (Signed)
  Delivery Note for Twin B Called to evaluate patient with PPROM around 6:08PM on 05/12/2012 with frequent contractions and pelvic pressure.  On exam, she was noted to have full cervical dilation, ruptured membranes, fetal breech presentation at +3 station.  After a couple of pushes, at 6:31 PM on 05/12/2012, a viable female (Twin B) was delivered via Vaginal, Spontaneous Delivery (Presentation: breech).  APGAR: 4, 7; weight 1 lb 6 oz.  Both placentas were delivered after injection of the cords with a solution of pitocin 10 units in normal saline; both placentas had 3VC and were sent to pathology.  Anesthesia: Epidural  Episiotomy: None Lacerations: None Est. Blood Loss (mL): 150  Mom to postpartum.  Baby to NICU.  Jaynie Collins, MD, FACOG Attending Obstetrician & Gynecologist Faculty Practice, [facility] [place]

## 2012-05-12 NOTE — Progress Notes (Signed)
Attending Result Addendum  05/12/2012  CT ANGIOGRAPHY CHEST  Clinical Data: Shortness of breath after twin delivery   Technique:  Multidetector CT imaging of the chest using the standard protocol during bolus administration of intravenous contrast. Multiplanar reconstructed images including MIPs were obtained and reviewed to evaluate the vascular anatomy.  Contrast: OMNIPAQUE IOHEXOL 350 MG/ML SOLN  Comparison: Chest radiograph same date  Findings: Evaluation is suboptimal for acute pulmonary embolism due to early bolus timing.  Allowing for this, no central focal filling defect is seen in either main pulmonary artery up to the level of the 3rd order pulmonary arteries to suggest pulmonary embolism. Heart size is normal.  No lymphadenopathy.  Great vessels are normal in caliber.  No pericardial effusion.  Small bilateral pleural effusions are noted.  Dense multilobar predominately perihilar and bilateral lower lobe confluent and pulmonary parenchymal opacities are noted.  Air bronchogram formation is noted.  Central airways are patent.  No acute osseous finding.  Incompletely imaged within the spleen is a 6.0 cm homogeneously early enhancing round mass.  IMPRESSION: Dense confluent multilobar are central airspace opacities.  The distribution is most typical for pulmonary edema but amniotic fluid embolism can have a similar appearance, as can other alveolar filling processes such as hemorrhage, protein, or pneumonia.  The examination is suboptimal for evaluation for acute pulmonary arterial embolism past the level of the third pulmonary arteries but no central focal filling defect is identified.  Possible splenic mass with imaging features suggestive of a hemangioma, but incompletely imaged.  This could be further evaluated on a non emergent basis with abdominal ultrasound.   Original Report Authenticated By: Harrel Lemon, M.D.    05/12/2012  PORTABLE CHEST - 1 VIEW  Clinical Data: Shortness of  breath, vaginal delivery 1 hour ago   Comparison: None.  Findings:  Normal cardiac silhouette and mediastinal contours.  Ill-defined bilateral heterogeneous perihilar predominant airspace opacities with relative sparing of the lung periphery.  No definite pleural effusion or pneumothorax.  No acute osseous abnormalities. Radiopaque material overlies the left supraclavicular fossa and is likely external to the patient.  IMPRESSION: Ill-defined heterogeneous bilateral perihilar air space opacities, nonspecific with differential considerations including flash pulmonary edema, aspiration, pulmonary hemorrhage or (rarely) amniotic fluid embolism.   Original Report Authenticated By: Waynard Reeds, M.D.    Imaging studies show pulmonary edema. Will continue diuresis and respiratory support, and continue close observation in AICU.  Continue Zosyn for puerperal sepsis/ARDS. Will also follow up CCM recommendations.  Jaynie Collins, MD, FACOG Attending Obstetrician & Gynecologist Faculty Practice, [facility] [place]

## 2012-05-13 ENCOUNTER — Encounter (HOSPITAL_COMMUNITY): Payer: Self-pay

## 2012-05-13 DIAGNOSIS — I369 Nonrheumatic tricuspid valve disorder, unspecified: Secondary | ICD-10-CM

## 2012-05-13 LAB — TYPE AND SCREEN
ABO/RH(D): A POS
Antibody Screen: NEGATIVE
Unit division: 0

## 2012-05-13 LAB — CBC
HCT: 28.8 % — ABNORMAL LOW (ref 36.0–46.0)
Hemoglobin: 9.4 g/dL — ABNORMAL LOW (ref 12.0–15.0)
MCH: 31.8 pg (ref 26.0–34.0)
MCHC: 32.6 g/dL (ref 30.0–36.0)
RBC: 2.96 MIL/uL — ABNORMAL LOW (ref 3.87–5.11)

## 2012-05-13 LAB — PRO B NATRIURETIC PEPTIDE: Pro B Natriuretic peptide (BNP): 562.8 pg/mL — ABNORMAL HIGH (ref 0–125)

## 2012-05-13 MED ORDER — FUROSEMIDE 10 MG/ML IJ SOLN
20.0000 mg | Freq: Once | INTRAMUSCULAR | Status: AC
  Administered 2012-05-13: 20 mg via INTRAVENOUS
  Filled 2012-05-13: qty 2

## 2012-05-13 NOTE — Progress Notes (Signed)
UR chart review completed.  

## 2012-05-13 NOTE — Progress Notes (Signed)
SCr - 0.42, est CrCl 115 ml/min  Dosing of Zosyn is appropriate for renal function. Continue current therapy.   Pharmacy will sign off. Please do not hesitate to contact us if you need anything further.  Thank you.

## 2012-05-13 NOTE — Progress Notes (Signed)
Post Partum Day 2 and 1 s/p SVD x 2 of twins at [redacted] weeks GA, 28 hours apart; postpartum course complicated by sepsis/ARDS Now afebrile since 10/10. WBC normal at 10K Subjective: no complaints, up ad lib, tolerating PO and good urine output able to ambulate to visit babies without problems.  Objective: Blood pressure 104/53, pulse 65, temperature 98 F (36.7 C), temperature source Oral, resp. rate 18, height 5\' 7"  (1.702 m), weight 77.248 kg (170 lb 4.8 oz), last menstrual period 11/25/2011, SpO2 96.00%, unknown if currently breastfeeding. Echocardiogram ejection fraction normal 55-60% Physical Exam:  General: alert, cooperative and no distress Lochia: appropriate Uterine Fundus: firm at u-4 Incision:  DVT Evaluation: No evidence of DVT seen on physical exam. Reflexes 2+-3+   Basename 05/13/12 0610 05/12/12 2220  HGB 9.4* 9.1*  HCT 28.8* 28.3*    Assessment/Plan: Stable on days 2/1 s/p SVD of twins, PPROM 24 wks.  Resolved sepsis/ards Transfer out of ICU. Probable discharge Sunday.  Breast feeding/ Desires IUD Toney Reil, / [facility] followup.  LOS: 4 days   Remberto Lienhard V 05/13/2012, 7:17 PM

## 2012-05-13 NOTE — Progress Notes (Signed)
Called to rapid response for tachypneic, tachycardic patient with sats in mid 80's, placed pt on NRB 100% O2. MD at [facility]

## 2012-05-13 NOTE — Progress Notes (Signed)
Transported pt to CT with L&D RN, VSS, then transported pt to ICU 374

## 2012-05-13 NOTE — Progress Notes (Signed)
  Echocardiogram 2D Echocardiogram has been performed.  Cathie Beams 05/13/2012, 12:42 PM

## 2012-05-13 NOTE — Progress Notes (Signed)
Report to Marigene Ehlers, RN on WU.  Pt transferred via WC in sts condition to room 302.

## 2012-05-13 NOTE — Progress Notes (Signed)
This was a follow-up visit with Julia Levine who was seen by Caro Hight yesterday.  She was in good spirits this afternoon.  She reported that she had been very scared throughout her health crisis last night, but that she was feeling much better now.  She was grateful that the babies are doing well.  She is aware of on-going chaplain availability in the NICU and we will continue to check in with the family when we round in the NICU.  [address] [place] Pager, [phone] 3:15   05/13/12 1600  Clinical Encounter Type  Visited With Patient  Visit Type Follow-up  Referral From Chaplain

## 2012-05-13 NOTE — Progress Notes (Signed)
Post Partum Day 2 and 1 s/p SVD x 2 of twins at [redacted] weeks GA, 28 hours apart; postpartum course complicated by sepsis/ARDS Subjective: Patient did not have any more episodes of respiratory difficulties overnight after receiving Lasix.  She was on 1L of oxygen via , but has been off oxygen for a few hours. No further CP, SOB or other symptoms. Moderate lochia. Breast pumping, babies are stable in NICU.   Objective: Blood pressure 107/58, pulse 66, temperature 98.5 F (36.9 C), temperature source Oral, resp. rate 20, height 5\' 7"  (1.702 m), weight 77.248 kg (170 lb 4.8 oz), last menstrual period 11/25/2011, SpO2 92.00%, unknown if currently breastfeeding. Tm 102.7 (05/12/12 2045) Physical Exam:  General: alert and no distress Lochia: appropriate Lungs: Rales all the way up on the R, 1/2 way on the left.   Heart: RRR Uterine Fundus: firm DVT Evaluation: No evidence of DVT seen on physical exam. Negative Homan's sign.  Results for orders placed during the hospital encounter of 05/09/12 (from the past 24 hour(s))  MRSA PCR SCREENING     Status: Normal   Collection Time   05/12/12  9:15 PM      Component Value Range   MRSA by PCR NEGATIVE  NEGATIVE  CBC WITH DIFFERENTIAL     Status: Abnormal   Collection Time   05/12/12 10:20 PM      Component Value Range   WBC 9.1  4.0 - 10.5 K/uL   RBC 2.83 (*) 3.87 - 5.11 MIL/uL   Hemoglobin 9.1 (*) 12.0 - 15.0 g/dL   HCT 16.1 (*) 09.6 - 04.5 %   MCV 100.0  78.0 - 100.0 fL   MCH 32.2  26.0 - 34.0 pg   MCHC 32.2  30.0 - 36.0 g/dL   RDW 40.9  81.1 - 91.4 %   Platelets 121 (*) 150 - 400 K/uL   Neutrophils Relative 85 (*) 43 - 77 %   Neutro Abs 7.7  1.7 - 7.7 K/uL   Lymphocytes Relative 9 (*) 12 - 46 %   Lymphs Abs 0.8  0.7 - 4.0 K/uL   Monocytes Relative 5  3 - 12 %   Monocytes Absolute 0.5  0.1 - 1.0 K/uL   Eosinophils Relative 0  0 - 5 %   Eosinophils Absolute 0.0  0.0 - 0.7 K/uL   Basophils Relative 0  0 - 1 %   Basophils Absolute 0.0  0.0  - 0.1 K/uL  BASIC METABOLIC PANEL     Status: Abnormal   Collection Time   05/12/12 10:20 PM      Component Value Range   Sodium 135  135 - 145 mEq/L   Potassium 3.3 (*) 3.5 - 5.1 mEq/L   Chloride 101  96 - 112 mEq/L   CO2 22  19 - 32 mEq/L   Glucose, Bld 81  70 - 99 mg/dL   BUN 6  6 - 23 mg/dL   Creatinine, Ser 7.82 (*) 0.50 - 1.10 mg/dL   Calcium 7.2 (*) 8.4 - 10.5 mg/dL   GFR calc non Af Amer >90  >90 mL/min   GFR calc Af Amer >90  >90 mL/min  PRO B NATRIURETIC PEPTIDE     Status: Abnormal   Collection Time   05/12/12 10:20 PM      Component Value Range   Pro B Natriuretic peptide (BNP) 562.8 (*) 0 - 125 pg/mL  CREATININE, SERUM     Status: Abnormal   Collection Time  05/13/12  6:10 AM      Component Value Range   Creatinine, Ser 0.42 (*) 0.50 - 1.10 mg/dL   GFR calc non Af Amer >90  >90 mL/min   GFR calc Af Amer >90  >90 mL/min  CBC     Status: Abnormal   Collection Time   05/13/12  6:10 AM      Component Value Range   WBC 10.3  4.0 - 10.5 K/uL   RBC 2.96 (*) 3.87 - 5.11 MIL/uL   Hemoglobin 9.4 (*) 12.0 - 15.0 g/dL   HCT 40.9 (*) 81.1 - 91.4 %   MCV 97.3  78.0 - 100.0 fL   MCH 31.8  26.0 - 34.0 pg   MCHC 32.6  30.0 - 36.0 g/dL   RDW 78.2  95.6 - 21.3 %   Platelets 142 (*) 150 - 400 K/uL    Assessment/Plan: PPD#2/1 after delivery of twins after PPROM at [redacted] weeks GA, complicated with postpartum sepsis/ARDS.  Doing well now. -Continue Zosyn, last fever was 102.7 on 05/12/12 -Continue Lasix as needed for diuresis -Continue AICu care for now, may transfer to floor later if stable -Breastfeeding, plans Mirena for [facility] -Routine postpartum care   LOS: 4 days   Julia Levine A 05/13/2012, 7:55 AM

## 2012-05-14 ENCOUNTER — Encounter (HOSPITAL_COMMUNITY)
Admission: RE | Admit: 2012-05-14 | Discharge: 2012-05-14 | Disposition: A | Discharge status: Home / Self Care | Payer: Managed Care, Other (non HMO) | Source: Ambulatory Visit | Attending: Family Medicine | Admitting: Family Medicine

## 2012-05-14 DIAGNOSIS — O923 Agalactia: Secondary | ICD-10-CM | POA: Insufficient documentation

## 2012-05-14 LAB — CBC WITH DIFFERENTIAL/PLATELET
Eosinophils Absolute: 0.1 10*3/uL (ref 0.0–0.7)
Eosinophils Relative: 1 % (ref 0–5)
HCT: 30.5 % — ABNORMAL LOW (ref 36.0–46.0)
Lymphocytes Relative: 23 % (ref 12–46)
Lymphs Abs: 2.1 10*3/uL (ref 0.7–4.0)
MCH: 31.3 pg (ref 26.0–34.0)
MCV: 95.6 fL (ref 78.0–100.0)
Monocytes Absolute: 0.9 10*3/uL (ref 0.1–1.0)
Platelets: 165 10*3/uL (ref 150–400)
RBC: 3.19 MIL/uL — ABNORMAL LOW (ref 3.87–5.11)
RDW: 13.1 % (ref 11.5–15.5)
WBC: 9.4 10*3/uL (ref 4.0–10.5)

## 2012-05-14 LAB — URINE CULTURE

## 2012-05-14 MED ORDER — IBUPROFEN 600 MG PO TABS: 600.0000 mg | ORAL_TABLET | Freq: Four times a day (QID) | ORAL | Status: DC

## 2012-05-14 NOTE — Anesthesia Postprocedure Evaluation (Signed)
Patient stable following vaginal delivery.  

## 2012-05-14 NOTE — Discharge Summary (Signed)
Obstetric Discharge Summary Reason for Admission: Di-Di twin pregnancy with PPROM Prenatal Procedures: NST and IV betamethasone, latency antibiotics and magnesium sulfate Intrapartum Procedures: spontaneous vaginal delivery Postpartum Procedures: none Complications-Operative and Postpartum: ARDS and sepsis Hemoglobin  Date Value Range Status  05/14/2012 10.0* 12.0 - 15.0 g/dL Final     HCT  Date Value Range Status  05/14/2012 30.5* 36.0 - 46.0 % Final   Patient was admitted secondary to PPROM with di-di twin pregnancy and advanced cervical exam. After receiving 2 doses of betamethasone and 2 days worth of latency antibiotics, she delivered baby A via vaginal delivery. Baby B delivered 28 hours later also via vaginal delivery. Patient became septic and developed ARDS. She was transferred to [facility] and received zosyn and lasix. On PPD#3 after close to 48 hr of being afebrile she was found stable to go home. Discharged instructions reviewed. Patient planning on using Mirena IUD for birth control.  Physical Exam:  General: alert, cooperative and no distress Lochia: appropriate Uterine Fundus: firm and non-tender Incision: n/a DVT Evaluation: No evidence of DVT seen on physical exam. Negative Homan's sign. No cords or calf tenderness. No significant calf/ankle edema.  Discharge Diagnoses: Preterm-delivery of twin pregnancy  Discharge Information: Date: 05/14/2012 Activity: pelvic rest Diet: routine Medications: Ibuprofen Condition: stable Instructions: refer to practice specific booklet Discharge to: home Follow-up Information    Follow up with [facility] @. (An appointment will be made for you in 4-5 weeks)          Newborn Data:   Jonnae, Murai [[id number]]  Live born female  Birth Weight: 1 lb 9.4 oz (720 g) APGAR: 5, 7   Darrick Penna [place] [[id number]]  Live born female  Birth Weight: 1 lb 5.9 oz (620 g) APGAR: 4, 7  Infants will remain in  NICU secondary to prematurity.  Morgane Joerger 05/14/2012, 8:42 AM

## 2012-05-14 NOTE — Progress Notes (Signed)
Notified lactation patient needs to rent a breast pump.

## 2012-05-15 NOTE — Clinical Social Work Note (Signed)
Late entry  Clinical Social Work Department PSYCHOSOCIAL ASSESSMENT - MATERNAL/CHILD 05/15/2012  Patient:  Julia Levine, Julia Levine  Account Number:  [id number]  Admit Date:  05/09/2012  Marjo Bicker Name:   Lavone Neri and Larose Hires    Clinical Social Worker:  Truman [place], [place]   Date/Time:  05/14/2012 03:30 PM  Date Referred:  05/14/2012   Referral source  Physician     Referred reason  NICU   Other referral source:    I:  FAMILY / HOME ENVIRONMENT Child's legal guardian:  PARENT  Guardian - Name Guardian - Age Guardian - Address  Julia Levine 26 [address] [place], [place] [address]  Windy Fast  [address] [place], [place] [address]   Other household support members/support persons Name Relationship DOB  none     Other support:   MOB and FOB report lots of family support in the area    II  PSYCHOSOCIAL DATA Information Source:  Patient Interview  Event organiser Employment:   Financial resources:  [employer] If Medicaid - County:  [employer] / Grade:   Maternity Care Coordinator / Child Services Coordination / Early Interventions:  Cultural issues impacting care:    III  STRENGTHS Strengths  Adequate Resources  Supportive family/friends  Home prepared for Child (including basic supplies)  Compliance with medical plan  Understanding of illness   Strength comment:    IV  RISK FACTORS AND CURRENT PROBLEMS Current Problem:  None   Risk Factor & Current Problem Patient Issue Family Issue Risk Factor / Current Problem Comment   N N     V  SOCIAL WORK ASSESSMENT CSW spoke with MOB and FOB at bedside.  CSW discussed admission to NICU and treatment.  MOB reports good communication with nurses on treatment.  MOB also reported managing emotions around infants int he NICU.  CSW discussed common symptoms that may arise and instructed MOB or FOB to let CSW or RN know if any concerns arise.  CSW discussed  family support.  MOB and FOB reported good family support in the area and has no concerns with family support.  CSW discussed supplies.  MOB reported looking into [facility] and having an appt.  Lactation spoke with MOB about getting a loaner pump through the hospital until medicaid is put into place.  No hx of SA and no current concerns with SA.  CSW discussed infants qualifying for SSI and completed worksheet for this.  SW will continue to follow for assistance with SSI and continued support while infant is in the NICU.      VI SOCIAL WORK PLAN Social Work Plan  Psychosocial Support/Ongoing Assessment of Needs   Type of pt/family education:   common emotion symptoms triggered by NICU admit.   If child protective services report - county:   If child protective services report - date:   Information/referral to community resources comment:   Other social work plan:

## 2012-05-16 NOTE — Progress Notes (Signed)
Ur chart review completed.  

## 2012-05-19 ENCOUNTER — Encounter: Payer: Managed Care, Other (non HMO) | Admitting: Obstetrics & Gynecology

## 2012-05-19 LAB — CULTURE, BLOOD (ROUTINE X 2): Culture: NO GROWTH

## 2012-05-30 ENCOUNTER — Encounter (HOSPITAL_COMMUNITY): Payer: Self-pay | Admitting: *Deleted

## 2012-06-07 ENCOUNTER — Encounter: Payer: Self-pay | Admitting: Obstetrics and Gynecology

## 2012-06-07 ENCOUNTER — Ambulatory Visit (INDEPENDENT_AMBULATORY_CARE_PROVIDER_SITE_OTHER): Payer: Managed Care, Other (non HMO) | Admitting: Obstetrics and Gynecology

## 2012-06-07 NOTE — Progress Notes (Signed)
  Subjective:     Julia Levine is a 26 y.o. female who presents for a postpartum visit. She is 4 weeks postpartum following a spontaneous vaginal delivery. I have fully reviewed the prenatal and intrapartum course. The delivery was at 24 gestational weeks. Outcome: PPROM at 24 weeks followed by delivery of baby A and delivery of baby B 24 hours later via vaginal delivery. Anesthesia: epidural. Postpartum course has been complicated by ARDS. Baby's course has been complicated by fetal demise times two secondary to NEC. Bleeding no bleeding. Bowel function is normal. Bladder function is normal. Patient is sexually active. Contraception method is condoms. Postpartum depression screening: negative. Patient reports having a lot of support. She is dealing with the process of grief very well.     Review of Systems A comprehensive review of systems was negative.   Objective:    BP 96/66  Pulse 88  Ht 5\' 7"  (1.702 m)  Wt 153 lb (69.4 kg)  BMI 23.96 kg/m2  General:  alert, cooperative and no distress   Breasts:  inspection negative, no nipple discharge or bleeding, no masses or nodularity palpable  Lungs: clear to auscultation bilaterally  Heart:  regular rate and rhythm  Abdomen: soft, non-tender; bowel sounds normal; no masses,  no organomegaly   Vulva:  normal  Vagina: normal vagina  Cervix:  multiparous appearance  Corpus: normal size, contour, position, consistency, mobility, non-tender  Adnexa:  normal adnexa and no mass, fullness, tenderness  Rectal Exam: Not performed.        Assessment:    Normal postpartum exam. Pap smear not done at today's visit.   Plan:    1. Contraception: condoms 2. Continue prenantal vitamins. Patient would like to conceive in the near future. Advised to wait 3-6 normal menstrual cycles 3. Patient with abnormal pap smear and needs repeat colpo. Follow up in: 1 week or as needed.

## 2012-06-20 ENCOUNTER — Ambulatory Visit (INDEPENDENT_AMBULATORY_CARE_PROVIDER_SITE_OTHER): Payer: Managed Care, Other (non HMO) | Admitting: Obstetrics and Gynecology

## 2012-06-20 ENCOUNTER — Encounter: Payer: Self-pay | Admitting: Obstetrics and Gynecology

## 2012-06-20 ENCOUNTER — Other Ambulatory Visit: Payer: Self-pay | Admitting: Obstetrics and Gynecology

## 2012-06-20 VITALS — BP 102/60 | HR 95 | Ht 67.0 in | Wt 153.0 lb

## 2012-06-20 DIAGNOSIS — Z01812 Encounter for preprocedural laboratory examination: Secondary | ICD-10-CM

## 2012-06-20 DIAGNOSIS — R6889 Other general symptoms and signs: Secondary | ICD-10-CM

## 2012-06-20 DIAGNOSIS — IMO0002 Reserved for concepts with insufficient information to code with codable children: Secondary | ICD-10-CM

## 2012-06-20 NOTE — Progress Notes (Signed)
Patient ID: Julia Levine, female   DOB: 15-Nov-1985, 26 y.o.   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 Pap smear performed  Patient will be contacted with any abnormal results.

## 2012-06-20 NOTE — Progress Notes (Signed)
Here today for colposcopy, specimens will need to go to [facility].

## 2012-06-30 LAB — PAP LB, RFX HPV ASCU: PAP Smear Comment: 0

## 2012-07-05 ENCOUNTER — Telehealth: Payer: Self-pay | Admitting: *Deleted

## 2012-07-05 NOTE — Telephone Encounter (Signed)
Message copied by Barbara Cower on Tue Jul 05, 2012  5:01 PM ------      Message from: CONSTANT, PEGGY      Created: Mon Jul 04, 2012  3:51 PM       Needs colpo appointment

## 2012-07-05 NOTE — Telephone Encounter (Signed)
Patient notified of pap result and appointment made for Jul 13, 2012 with Dr. Jolayne Panther for colpo.

## 2012-07-13 ENCOUNTER — Encounter: Payer: Managed Care, Other (non HMO) | Admitting: Obstetrics and Gynecology

## 2012-08-11 ENCOUNTER — Encounter: Payer: Managed Care, Other (non HMO) | Admitting: Obstetrics and Gynecology

## 2012-08-19 ENCOUNTER — Other Ambulatory Visit: Payer: Self-pay | Admitting: Family Medicine

## 2012-08-19 ENCOUNTER — Ambulatory Visit (INDEPENDENT_AMBULATORY_CARE_PROVIDER_SITE_OTHER): Payer: Managed Care, Other (non HMO) | Admitting: Family Medicine

## 2012-08-19 ENCOUNTER — Encounter: Payer: Self-pay | Admitting: Family Medicine

## 2012-08-19 VITALS — BP 110/74 | HR 83 | Ht 67.0 in | Wt 161.0 lb

## 2012-08-19 DIAGNOSIS — IMO0002 Reserved for concepts with insufficient information to code with codable children: Secondary | ICD-10-CM

## 2012-08-19 DIAGNOSIS — Z01812 Encounter for preprocedural laboratory examination: Secondary | ICD-10-CM

## 2012-08-19 DIAGNOSIS — R87612 Low grade squamous intraepithelial lesion on cytologic smear of cervix (LGSIL): Secondary | ICD-10-CM

## 2012-08-19 DIAGNOSIS — R87619 Unspecified abnormal cytological findings in specimens from cervix uteri: Secondary | ICD-10-CM

## 2012-08-19 DIAGNOSIS — Z01818 Encounter for other preprocedural examination: Secondary | ICD-10-CM

## 2012-08-19 NOTE — Patient Instructions (Signed)
Colposcopy  Colposcopy is a procedure that uses a special lighted microscope (colposcope). It examines your cervix and vagina, or the area around the outside of the vagina, for signs of disease or abnormalities in the cells. You may be sent to a specialist (gynecologist) to do the colposcopy. A biopsy (tissue sample) may be collected during a colposcopy, if the caregiver finds any unusual cells. The biopsy is sent to the lab for further testing, and the results are reported back to your caregiver.  A WOMAN MAY NEED THIS PROCEDURE IF:  · She has had an abnormal pap smear (taking cells from the cervix for testing).  · She has a sore on her cervix, and a Pap test was normal.  · The Pap test suggests human papilloma virus (HPV). This virus can cause genital warts and is linked to the development of cervical cancer.  · She has genital warts on the cervix, or in or around the outside of the vagina.  · Her mother took the drug DES while pregnant.  · She has painful intercourse.  · She has vaginal bleeding, especially after sexual intercourse.  · There is a need to evaluate the results of previous treatment.  BEFORE THE PROCEDURE   · Colposcopy is done when you are not having a menstrual period.  · For 24 hours before the colposcopy, do not:  · Douche.  · Use tampons.  · Use medicines, creams, or suppositories in the vagina.  · Have sexual intercourse.  PROCEDURE   · A colposcopy is done while a woman is lying on her back with her feet in foot rests (stirrups).  · A speculum is placed inside the vagina to keep it open and to allow the caregiver to see the cervix. This is the same instrument used to do a pap smear.  · The colposcope is placed outside the vagina. It is used to magnify and examine the cervix, vagina, and the area around the outside of the vagina.  · A small amount of liquid solution is placed on the area that is to be viewed. This solution is placed on with a cotton applicator. This solution makes it easier to  see the abnormal cells.  · Your caregiver will suck out mucus and cells from the canal of the cervix.  · Small pieces of tissue for biopsy may be taken at the same time. You may feel mild pain or discomfort when this is done.  · Your caregiver will record the location of the abnormal areas and send the tissue samples to a lab for analysis.  · If your caregiver biopsies the vagina or outside of the vagina, a local anesthetic (novocaine) is usually given.  AFTER THE PROCEDURE   · You may have some cramping that often goes away in a few minutes. You may have some soreness for a couple of days.  · You may take over-the-counter pain medicine as advised by your caregiver. Do not take aspirin because it can cause bleeding.  · Lie down for a few minutes if you feel lightheaded.  · You may have some bleeding or dark discharge that should stop in a few days.  · You may need to wear a sanitary pad for a few days.  HOME CARE INSTRUCTIONS   · Avoid sex, douching, and using tampons for a week or as directed.  · Only take medicine as directed by your caregiver.  · Continue to take birth control pills, if you are on them.  ·   Not all test results are available during your visit. If your test results are not back during the visit, make an appointment with your caregiver to find out the results. Do not assume everything is normal if you have not heard from your caregiver or the medical facility. It is important for you to follow up on all of your test results.  · Follow your caregiver's advice regarding medicines, activity, follow-up visits, and follow-up Pap tests.  SEEK MEDICAL CARE IF:   · You develop a rash.  · You have problems with your medicine.  SEEK IMMEDIATE MEDICAL CARE IF:  · You are bleeding heavily or are passing blood clots.  · You develop a fever over 102° F (38.9° C), with or without chills.  · You have abnormal vaginal discharge.  · You are having cramps that do not go away after taking your pain medicine.  · You  feel lightheaded, dizzy, or faint.  · You develop stomach pain.  Document Released: 10/10/2002 Document Revised: 10/12/2011 Document Reviewed: 05/23/2009  Exit[employer]® Patient Information ©2013 [employer].

## 2012-08-22 NOTE — Assessment & Plan Note (Signed)
S/p colpo--f/u results and further treatment per ASCCP guidelines.

## 2012-08-22 NOTE — Progress Notes (Signed)
Patient ID: Julia Levine, female   DOB: 1985/11/18, 27 y.o.   MRN: [id number] Pt. Is an 27 y.o. [id number] who presents for follow-up following an abnormal pap smear which showed LGSIL.   Patient given informed consent, signed copy in the chart, time out was performed.  Placed in lithotomy position. Cervix viewed with speculum and colposcope after application of acetic acid.   Colposcopy adequate?  yes Acetowhite lesions?yes, superior cervix Punctation?no Mosaicism?  no Abnormal vasculature?  no Biopsies?7 and 11 o'clock ECC?yes  COMMENTS: Patient was given post procedure instructions.  We will call with results.

## 2012-08-24 ENCOUNTER — Encounter: Payer: Self-pay | Admitting: Family Medicine

## 2012-08-24 LAB — PATHOLOGY

## 2013-01-28 IMAGING — CR DG CHEST 1V PORT
1 series · 1 of 1 positions shown · non-contrast
Comparison: None.

CLINICAL DATA: Shortness of breath, vaginal delivery 1 hour ago

PORTABLE CHEST - 1 VIEW

[view not recorded]
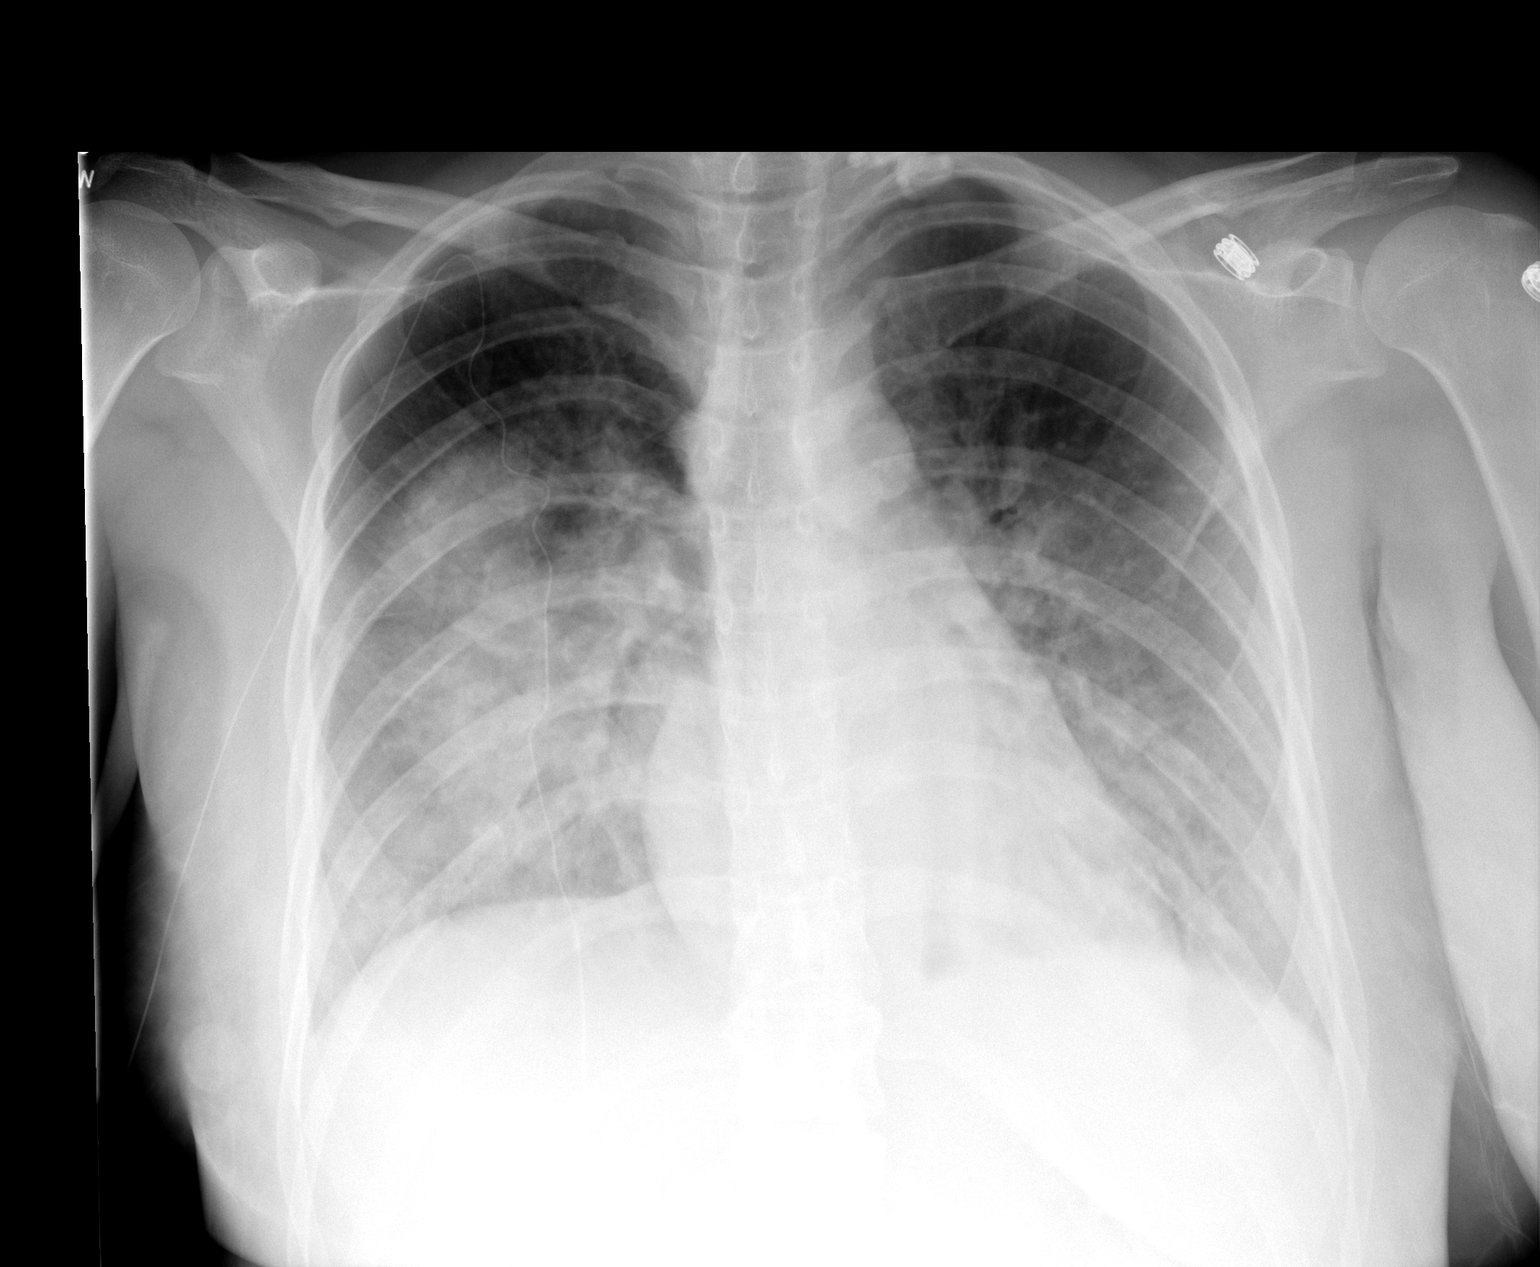

[1 of 1 positions shown; findings below may reference images not displayed]

FINDINGS: Normal cardiac silhouette and mediastinal contours.  Ill-defined
bilateral heterogeneous perihilar predominant airspace opacities
with relative sparing of the lung periphery.  No definite pleural
effusion or pneumothorax.  No acute osseous abnormalities.
Radiopaque material overlies the left supraclavicular fossa and is
likely external to the patient.
IMPRESSION: Ill-defined heterogeneous bilateral perihilar air space opacities,
nonspecific with differential considerations including flash
pulmonary edema, aspiration, pulmonary hemorrhage or (rarely)
amniotic fluid embolism.

## 2013-02-10 ENCOUNTER — Encounter: Payer: Self-pay | Admitting: Family Medicine

## 2013-02-10 ENCOUNTER — Ambulatory Visit (INDEPENDENT_AMBULATORY_CARE_PROVIDER_SITE_OTHER): Payer: Managed Care, Other (non HMO) | Admitting: Family Medicine

## 2013-02-10 VITALS — BP 118/72 | HR 78 | Ht 67.0 in | Wt 160.0 lb

## 2013-02-10 DIAGNOSIS — N920 Excessive and frequent menstruation with regular cycle: Secondary | ICD-10-CM | POA: Insufficient documentation

## 2013-02-10 NOTE — Patient Instructions (Signed)
Preparing for Pregnancy Preparing for pregnancy (preconceptual care) by getting counseling and information from your caregiver before getting pregnant is a good idea. It will help you and your baby have a better chance to have a healthy, safe pregnancy and delivery of your baby. Make an appointment with your caregiver to talk about your health, medical, and family history and how to prepare yourself before getting pregnant. Your caregiver will do a complete physical exam and a Pap test. They will want to know:  About you, your spouse or partner, and your family's medical and genetic history.  If you are eating a balanced diet and drinking enough fluids.  What vitamins and mineral supplements you are taking. This includes taking folic acid before getting pregnant to help prevent birth defects.  What medications you are taking including prescription, over-the-counter and herbal medications.  If there is any substance abuse like alcohol, smoking, and illegal drugs.  If there is any mental or physical domestic violence.  If there is any risk of sexually transmitted disease between you and your partner.  What immunizations and vaccinations you have had and what you may need before getting pregnant.  If you should get tested for HIV infection.  If there is any exposure to chemical or toxic substances at home or work.  If there are medical problems you have that need to be treated and kept under control before getting pregnant such as diabetes, high blood pressure or others.  If there were any past surgeries, pregnancies and problems with them.  What your current weight is and to set a goal as to how much weight you should gain while pregnant. Also, they will check if you should lose or gain weight before getting pregnant.  What is your exercise routine and what it is safe when you are pregnant.  If there are any physical disabilities that need to be addressed.  About spacing your  pregnancies when there are other children.  If there is a financial problem that may affect you having a child. After talking about the above points with your caregiver, your caregiver will give you advice on how to help treat and work with you on solving any issues, if necessary, before getting pregnant. The goal is to have a healthy and safe pregnancy for you and your baby. You should keep an accurate record of your menstrual periods because it will help in determining your due date. Immunizations that you should have before getting pregnant:   Regular measles, German measles (rubella) and mumps.  Tetanus and diphtheria.  Chickenpox, if not immune.  Herpes zoster (Varicella) if not immune.  Human papilloma virus vaccine (HPV) between the age of 9 and 26 years old.  Hepatitis A vaccine.  Hepatitis B vaccine.  Influenza vaccine.  Pneumococcal vaccine (pneumonia). You should avoid getting pregnant for one month after getting vaccinated with a live virus vaccine such as German measles (rubella) vaccine. Other immunizations may be necessary depending on where you live, such as malaria. Ask your caregiver if any other immunizations are needed for you. HOME CARE INSTRUCTIONS   Follow the advice of your caregiver.  Before getting pregnant:  Begin taking vitamins, supplements, and 0.4 milligrams folic acid daily.  Get your immunizations up-to-date.  Get help from a nutrition counselor if you do not understand what a balanced diet is, need help with a special medical diet or if you need help to lose or gain weight.  Begin exercising.  Stop smoking, taking illegal drugs,   and drinking alcoholic beverages.  Get counseling if there is and type of domestic violence.  Get checked for sexually transmitted diseases including HIV.  Get any medical problems under control (diabetes, high blood pressure, convulsions, asthma or others).  Resolve any financial concerns.  Be sure you and  your spouse or partner are ready to have a baby.  Keep an accurate record of your menstrual periods. Document Released: 07/02/2008 Document Revised: 10/12/2011 Document Reviewed: 07/02/2008 [employer] Patient Information 2014 [employer].  

## 2013-02-10 NOTE — Assessment & Plan Note (Signed)
One time-doubt clinical significance.  Will check TSH.  Advised to keep track over next few months--w/u to f/u if persistently abnl.  PNV's if trying to conceive.

## 2013-02-10 NOTE — Progress Notes (Signed)
  Subjective:    Patient ID: Jerry Caras, female    DOB: 01-Feb-1986, 27 y.o.   MRN: [id number]  HPI  Seeking pregnancy since December.  Has normal cycles q 28 days.  Had second cycle after last month, one week later, bled for 3-4 days.  No other issues noted.  Denies weight gain/loss, heat/cold intolerance, stress.  No other s/sx's of pregnancy.  Review of Systems  Constitutional: Negative for fever and chills.  Gastrointestinal: Negative for nausea, vomiting and abdominal pain.  Genitourinary: Negative for pelvic pain.       Objective:   Physical Exam  Vitals reviewed. Constitutional: She appears well-developed and well-nourished.  HENT:  Head: Normocephalic and atraumatic.  Eyes: No scleral icterus.  Neck: Neck supple.  Cardiovascular: Normal rate.   Pulmonary/Chest: Effort normal.  Abdominal: Soft. Bowel sounds are normal.  Genitourinary: Vagina normal and uterus normal. Right adnexum displays no mass and no tenderness. Left adnexum displays no mass and no tenderness.          Assessment & Plan:

## 2013-02-11 LAB — TSH: TSH: 0.866 u[IU]/mL (ref 0.350–4.500)

## 2013-02-13 ENCOUNTER — Telehealth: Payer: Self-pay | Admitting: *Deleted

## 2013-02-13 NOTE — Telephone Encounter (Signed)
Pt aware of normal results.

## 2013-02-13 NOTE — Telephone Encounter (Signed)
Message copied by Grayland Ormond on Mon Feb 13, 2013  8:58 AM ------      Message from: Reva Bores      Created: Sat Feb 11, 2013  8:54 AM       nml TSH-inform pt. ------

## 2013-05-11 ENCOUNTER — Ambulatory Visit (INDEPENDENT_AMBULATORY_CARE_PROVIDER_SITE_OTHER): Payer: Managed Care, Other (non HMO) | Admitting: *Deleted

## 2013-05-11 ENCOUNTER — Encounter: Payer: Self-pay | Admitting: *Deleted

## 2013-05-11 VITALS — BP 113/74 | Wt 155.8 lb

## 2013-05-11 DIAGNOSIS — Z3480 Encounter for supervision of other normal pregnancy, unspecified trimester: Secondary | ICD-10-CM

## 2013-05-11 DIAGNOSIS — Z3481 Encounter for supervision of other normal pregnancy, first trimester: Secondary | ICD-10-CM

## 2013-05-11 DIAGNOSIS — Z23 Encounter for immunization: Secondary | ICD-10-CM

## 2013-05-11 LAB — OB RESULTS CONSOLE RUBELLA ANTIBODY, IGM: Rubella: NON-IMMUNE/NOT IMMUNE

## 2013-05-11 LAB — OB RESULTS CONSOLE RPR: RPR: NONREACTIVE

## 2013-05-11 LAB — OB RESULTS CONSOLE HIV ANTIBODY (ROUTINE TESTING): HIV: NONREACTIVE

## 2013-05-11 NOTE — Patient Instructions (Signed)

## 2013-05-11 NOTE — Addendum Note (Signed)
Addended by: Barbara Cower on: 05/11/2013 03:30 PM   Modules accepted: Orders

## 2013-05-12 ENCOUNTER — Encounter: Payer: Self-pay | Admitting: Family Medicine

## 2013-05-12 ENCOUNTER — Encounter (HOSPITAL_COMMUNITY): Payer: Self-pay | Admitting: *Deleted

## 2013-05-12 DIAGNOSIS — Z283 Underimmunization status: Secondary | ICD-10-CM | POA: Insufficient documentation

## 2013-05-12 DIAGNOSIS — O9989 Other specified diseases and conditions complicating pregnancy, childbirth and the puerperium: Secondary | ICD-10-CM

## 2013-05-12 LAB — PRENATAL PROFILE I(LABCORP): Rubella Antibodies, IGG: <0.9 index — ABNORMAL LOW (ref >0.99)

## 2013-05-13 LAB — CULTURE, URINE COMPREHENSIVE

## 2013-05-30 ENCOUNTER — Ambulatory Visit (INDEPENDENT_AMBULATORY_CARE_PROVIDER_SITE_OTHER): Payer: Managed Care, Other (non HMO) | Admitting: Family Medicine

## 2013-05-30 ENCOUNTER — Encounter: Payer: Self-pay | Admitting: Family Medicine

## 2013-05-30 VITALS — BP 116/71 | Wt 157.0 lb

## 2013-05-30 DIAGNOSIS — Z113 Encounter for screening for infections with a predominantly sexual mode of transmission: Secondary | ICD-10-CM

## 2013-05-30 DIAGNOSIS — Z3481 Encounter for supervision of other normal pregnancy, first trimester: Secondary | ICD-10-CM

## 2013-05-30 DIAGNOSIS — O09211 Supervision of pregnancy with history of pre-term labor, first trimester: Secondary | ICD-10-CM

## 2013-05-30 DIAGNOSIS — Z1151 Encounter for screening for human papillomavirus (HPV): Secondary | ICD-10-CM

## 2013-05-30 DIAGNOSIS — Z3480 Encounter for supervision of other normal pregnancy, unspecified trimester: Secondary | ICD-10-CM | POA: Insufficient documentation

## 2013-05-30 DIAGNOSIS — Z124 Encounter for screening for malignant neoplasm of cervix: Secondary | ICD-10-CM

## 2013-05-30 DIAGNOSIS — O09219 Supervision of pregnancy with history of pre-term labor, unspecified trimester: Secondary | ICD-10-CM | POA: Insufficient documentation

## 2013-05-30 MED ORDER — ONDANSETRON HCL 4 MG PO TABS: 4.0000 mg | ORAL_TABLET | Freq: Three times a day (TID) | ORAL | Status: DC | PRN

## 2013-05-30 NOTE — Assessment & Plan Note (Signed)
Serial cervical lengths at 16 wks, 17 OH-P

## 2013-05-30 NOTE — Progress Notes (Signed)
   Subjective:    Julia Levine is a [id number] [redacted]w[redacted]d being seen today for her first obstetrical visit.  Her obstetrical history is significant for previous PTB of twins and they both expired at 24 wks, came in with PPROM. Pregnancy history fully reviewed.  Patient reports nausea.  Filed Vitals:   05/30/13 1330  BP: 116/71  Weight: 157 lb (71.215 kg)    HISTORY: OB History  Gravida Para Term Preterm AB SAB TAB Ectopic Multiple Living  4 2 1 1 1 1  0 0 1 3    # Outcome Date GA Lbr Len/2nd Weight Sex Delivery Anes PTL Lv  4 CUR           3A PRE 05/11/12 106w0d 63:10 / 35:32 1 lb 9.4 oz (0.72 kg) M SVD EPI  N  3B  05/12/12 [redacted]w[redacted]d 63:10 / 63:35 1 lb 5.5 oz (0.61 kg) M BR EPI  N  2 SAB 03/2011 [redacted]w[redacted]d   U    N  1 TRM 12/28/08 [redacted]w[redacted]d  6 lb 6 oz (2.892 kg) F SVD EPI  Y     Comments: System Generated. Please review and update pregnancy details.     Past Medical History  Diagnosis Date  . 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 Colposcopy  10/2011    abnormal pap HPV   Family History  Problem Relation Age of Onset  . Hypertension Mother      Exam    Uterus:   7 wk size  Pelvic Exam:    Perineum: Normal Perineum   Vulva: Bartholin's, Urethra, Skene's normal   Vagina:  normal mucosa, normal discharge   Cervix: multiparous appearance and no cervical motion tenderness   Adnexa: normal adnexa   Bony Pelvis: average  System: Breast:  normal appearance, no masses or tenderness   Skin: normal coloration and turgor, no rashes    Neurologic: oriented   Extremities: normal strength, tone, and muscle mass   HEENT sclera clear, anicteric   Mouth/Teeth mucous membranes moist, pharynx normal without lesions   Neck supple   Cardiovascular: regular rate and rhythm, no murmurs or gallops   Respiratory:  appears well, vitals normal, no respiratory distress, acyanotic, normal RR, ear and throat exam is normal, neck free of mass or  lymphadenopathy, chest clear, no wheezing, crepitations, rhonchi, normal symmetric air entry   Abdomen: soft, non-tender; bowel sounds normal; no masses,  no organomegaly   Limited U/s shows FHR, SIUP and CRL c/w dates   Assessment:    Pregnancy: [id number] Patient Active Problem List   Diagnosis Date Noted  . Supervision of other normal pregnancy 05/30/2013    Priority: High  . Pregnancy with history of pre-term labor-PPROM at 24 wks/Twins 05/30/2013    Priority: Medium  . Rubella non-immune status, antepartum 05/12/2013    Priority: Medium  . Hypermenorrhea 02/10/2013  . LGSIL (low grade squamous intraepithelial dysplasia) 01/27/2012        Plan:     Initial labs drawn. Prenatal vitamins. Problem list reviewed and updated. Genetic Screening discussed First Screen: ordered.  Ultrasound discussed; fetal survey: discussed.  Follow up in 4 weeks. Begin 17 P at 16 wks.  Serial U/S for cervical length.   Alliya Marcon S 05/30/2013

## 2013-05-30 NOTE — Progress Notes (Signed)
P= 80 

## 2013-05-30 NOTE — Patient Instructions (Signed)
Pregnancy - First Trimester During sexual intercourse, millions of sperm go into the vagina. Only 1 sperm will penetrate and fertilize the female egg while it is in the Fallopian tube. One week later, the fertilized egg implants into the wall of the uterus. An embryo begins to develop into a baby. At 6 to 8 weeks, the eyes and face are formed and the heartbeat can be seen on ultrasound. At the end of 12 weeks (first trimester), all the baby's organs are formed. Now that you are pregnant, you will want to do everything you can to have a healthy baby. Two of the most important things are to get good prenatal care and follow your caregiver's instructions. Prenatal care is all the medical care you receive before the baby's birth. It is given to prevent, find, and treat problems during the pregnancy and childbirth. PRENATAL EXAMS  During prenatal visits, your weight, blood pressure, and urine are checked. This is done to make sure you are healthy and progressing normally during the pregnancy.  A pregnant woman should gain 25 to 35 pounds during the pregnancy. However, if you are overweight or underweight, your caregiver will advise you regarding your weight.  Your caregiver will ask and answer questions for you.  Blood work, cervical cultures, other necessary tests, and a Pap test are done during your prenatal exams. These tests are done to check on your health and the probable health of your baby. Tests are strongly recommended and done for HIV with your permission. This is the virus that causes AIDS. These tests are done because medicines can be given to help prevent your baby from being born with this infection should you have been infected without knowing it. Blood work is also used to find out your blood type, previous infections, and follow your blood levels (hemoglobin).  Low hemoglobin (anemia) is common during pregnancy. Iron and vitamins are given to help prevent this. Later in the pregnancy,  blood tests for diabetes will be done along with any other tests if any problems develop.  You may need other tests to make sure you and the baby are doing well. CHANGES DURING THE FIRST TRIMESTER  Your body goes through many changes during pregnancy. They vary from person to person. Talk to your caregiver about changes you notice and are concerned about. Changes can include:  Your menstrual period stops.  The egg and sperm carry the genes that determine what you look like. Genes from you and your partner are forming a baby. The female genes determine whether the baby is a boy or a girl.  Your body increases in girth and you may feel bloated.  Feeling sick to your stomach (nauseous) and throwing up (vomiting). If the vomiting is uncontrollable, call your caregiver.  Your breasts will begin to enlarge and become tender.  Your nipples may stick out more and become darker.  The need to urinate more. Painful urination may mean you have a bladder infection.  Tiring easily.  Loss of appetite.  Cravings for certain kinds of food.  At first, you may gain or lose a couple of pounds.  You may have changes in your emotions from day to day (excited to be pregnant or concerned something may go wrong with the pregnancy and baby).  You may have more vivid and strange dreams. HOME CARE INSTRUCTIONS   It is very important to avoid all smoking, alcohol and non-prescribed drugs during your pregnancy. These affect the formation and growth of the baby.   Avoid chemicals while pregnant to ensure the delivery of a healthy infant.  Start your prenatal visits by the 12th week of pregnancy. They are usually scheduled monthly at first, then more often in the last 2 months before delivery. Keep your caregiver's appointments. Follow your caregiver's instructions regarding medicine use, blood and lab tests, exercise, and diet.  During pregnancy, you are providing food for you and your baby. Eat regular,  well-balanced meals. Choose foods such as meat, fish, milk and other low fat dairy products, vegetables, fruits, and whole-grain breads and cereals. Your caregiver will tell you of the ideal weight gain.  You can help morning sickness by keeping soda crackers at the bedside. Eat a couple before arising in the morning. You may want to use the crackers without salt on them.  Eating 4 to 5 small meals rather than 3 large meals a day also may help the nausea and vomiting.  Drinking liquids between meals instead of during meals also seems to help nausea and vomiting.  A physical sexual relationship may be continued throughout pregnancy if there are no other problems. Problems may be early (premature) leaking of amniotic fluid from the membranes, vaginal bleeding, or belly (abdominal) pain.  Exercise regularly if there are no restrictions. Check with your caregiver or physical therapist if you are unsure of the safety of some of your exercises. Greater weight gain will occur in the last 2 trimesters of pregnancy. Exercising will help:  Control your weight.  Keep you in shape.  Prepare you for labor and delivery.  Help you lose your pregnancy weight after you deliver your baby.  Wear a good support or jogging bra for breast tenderness during pregnancy. This may help if worn during sleep too.  Ask when prenatal classes are available. Begin classes when they are offered.  Do not use hot tubs, steam rooms, or saunas.  Wear your seat belt when driving. This protects you and your baby if you are in an accident.  Avoid raw meat, uncooked cheese, cat litter boxes, and soil used by cats throughout the pregnancy. These carry germs that can cause birth defects in the baby.  The first trimester is a good time to visit your dentist for your dental health. Getting your teeth cleaned is okay. Use a softer toothbrush and brush gently during pregnancy.  Ask for help if you have financial, counseling, or  nutritional needs during pregnancy. Your caregiver will be able to offer counseling for these needs as well as refer you for other special needs.  Do not take any medicines or herbs unless told by your caregiver.  Inform your caregiver if there is any mental or physical domestic violence.  Make a list of emergency phone numbers of family, friends, hospital, and police and fire departments.  Write down your questions. Take them to your prenatal visit.  Do not douche.  Do not cross your legs.  If you have to stand for long periods of time, rotate you feet or take small steps in a circle.  You may have more vaginal secretions that may require a sanitary pad. Do not use tampons or scented sanitary pads. MEDICINES AND DRUG USE IN PREGNANCY  Take prenatal vitamins as directed. The vitamin should contain 1 milligram of folic acid. Keep all vitamins out of reach of children. Only a couple vitamins or tablets containing iron may be fatal to a baby or young child when ingested.  Avoid use of all medicines, including herbs, over-the-counter medicines, not   prescribed or suggested by your caregiver. Only take over-the-counter or prescription medicines for pain, discomfort, or fever as directed by your caregiver. Do not use aspirin, ibuprofen, or naproxen unless directed by your caregiver.  Let your caregiver also know about herbs you may be using.  Alcohol is related to a number of birth defects. This includes fetal alcohol syndrome. All alcohol, in any form, should be avoided completely. Smoking will cause low birth rate and premature babies.  Street or illegal drugs are very harmful to the baby. They are absolutely forbidden. A baby born to an addicted mother will be addicted at birth. The baby will go through the same withdrawal an adult does.  Let your caregiver know about any medicines that you have to take and for what reason you take them. SEEK MEDICAL CARE IF:  You have any concerns or  worries during your pregnancy. It is better to call with your questions if you feel they cannot wait, rather than worry about them. SEEK IMMEDIATE MEDICAL CARE IF:   An unexplained oral temperature above 102 F (38.9 C) develops, or as your caregiver suggests.  You have leaking of fluid from the vagina (birth canal). If leaking membranes are suspected, take your temperature and inform your caregiver of this when you call.  There is vaginal spotting or bleeding. Notify your caregiver of the amount and how many pads are used.  You develop a bad smelling vaginal discharge with a change in the color.  You continue to feel sick to your stomach (nauseated) and have no relief from remedies suggested. You vomit blood or coffee ground-like materials.  You lose more than 2 pounds of weight in 1 week.  You gain more than 2 pounds of weight in 1 week and you notice swelling of your face, hands, feet, or legs.  You gain 5 pounds or more in 1 week (even if you do not have swelling of your hands, face, legs, or feet).  You get exposed to German measles and have never had them.  You are exposed to fifth disease or chickenpox.  You develop belly (abdominal) pain. Round ligament discomfort is a common non-cancerous (benign) cause of abdominal pain in pregnancy. Your caregiver still must evaluate this.  You develop headache, fever, diarrhea, pain with urination, or shortness of breath.  You fall or are in a car accident or have any kind of trauma.  There is mental or physical violence in your home. Document Released: 07/14/2001 Document Revised: 04/13/2012 Document Reviewed: 01/15/2009 [employer] Patient Information 2014 [employer].  Breastfeeding A change in hormones during your pregnancy causes growth of your breast tissue and an increase in number and size of milk ducts. The hormone prolactin allows proteins, sugars, and fats from your blood supply to make breast milk in your milk-producing  glands. The hormone progesterone prevents breast milk from being released before the birth of your baby. After the birth of your baby, your progesterone level decreases allowing breast milk to be released. Thoughts of your baby, as well as his or her sucking or crying, can stimulate the release of milk from the milk-producing glands. Deciding to breastfeed (nurse) is one of the best choices you can make for you and your baby. The information that follows gives a brief review of the benefits, as well as other important skills to know about breastfeeding. BENEFITS OF BREASTFEEDING For your baby  The first milk (colostrum) helps your baby's digestive system function better.   There are antibodies in   your milk that help your baby fight off infections.   Your baby has a lower incidence of asthma, allergies, and sudden infant death syndrome (SIDS).   The nutrients in breast milk are better for your baby than infant formulas.  Breast milk improves your baby's brain development.   Your baby will have less gas, colic, and constipation.  Your baby is less likely to develop other conditions, such as childhood obesity, asthma, or diabetes mellitus. For you  Breastfeeding helps develop a very special bond between you and your baby.   Breastfeeding is convenient, always available at the correct temperature, and costs nothing.   Breastfeeding helps to burn calories and helps you lose the weight gained during pregnancy.   Breastfeeding makes your uterus contract back down to normal size faster and slows bleeding following delivery.   Breastfeeding mothers have a lower risk of developing osteoporosis or breast or ovarian cancer later in life.  BREASTFEEDING FREQUENCY  A healthy, full-term baby may breastfeed as often as every hour or space his or her feedings to every 3 hours. Breastfeeding frequency will vary from baby to baby.   Newborns should be fed no less than every 2 3 hours during  the day and every 4 5 hours during the night. You should breastfeed a minimum of 8 feedings in a 24 hour period.  Awaken your baby to breastfeed if it has been 3 4 hours since the last feeding.  Breastfeed when you feel the need to reduce the fullness of your breasts or when your newborn shows signs of hunger. Signs that your baby may be hungry include:  Increased alertness or activity.  Stretching.  Movement of the head from side to side.  Movement of the head and opening of the mouth when the corner of the mouth or cheek is stroked (rooting).  Increased sucking sounds, smacking lips, cooing, sighing, or squeaking.  Hand-to-mouth movements.  Increased sucking of fingers or hands.  Fussing.  Intermittent crying.  Signs of extreme hunger will require calming and consoling before you try to feed your baby. Signs of extreme hunger may include:  Restlessness.  A loud, strong cry.  Screaming.  Frequent feeding will help you make more milk and will help prevent problems, such as sore nipples and engorgement of the breasts.  BREASTFEEDING   Whether lying down or sitting, be sure that the baby's abdomen is facing your abdomen.   Support your breast with 4 fingers under your breast and your thumb above your nipple. Make sure your fingers are well away from your nipple and your baby's mouth.   Stroke your baby's lips gently with your finger or nipple.   When your baby's mouth is open wide enough, place all of your nipple and as much of the colored area around your nipple (areola) as possible into your baby's mouth.  More areola should be visible above his or her upper lip than below his or her lower lip.  Your baby's tongue should be between his or her lower gum and your breast.  Ensure that your baby's mouth is correctly positioned around the nipple (latched). Your baby's lips should create a seal on your breast.  Signs that your baby has effectively latched onto your  nipple include:  Tugging or sucking without pain.  Swallowing heard between sucks.  Absent click or smacking sound.  Muscle movement above and in front of his or her ears with sucking.  Your baby must suck about 2 3 minutes in   order to get your milk. Allow your baby to feed on each breast as long as he or she wants. Nurse your baby until he or she unlatches or falls asleep at the first breast, then offer the second breast.  Signs that your baby is full and satisfied include:  A gradual decrease in the number of sucks or complete cessation of sucking.  Falling asleep.  Extension or relaxation of his or her body.  Retention of a small amount of milk in his or her mouth.  Letting go of your breast by himself or herself.  Signs of effective breastfeeding in you include:  Breasts that have increased firmness, weight, and size prior to feeding.  Breasts that are softer after nursing.  Increased milk volume, as well as a change in milk consistency and color by the 5th day of breastfeeding.  Breast fullness relieved by breastfeeding.  Nipples are not sore, cracked, or bleeding.  If needed, break the suction by putting your finger into the corner of your baby's mouth and sliding your finger between his or her gums. Then, remove your breast from his or her mouth.  It is common for babies to spit up a small amount after a feeding.  Babies often swallow air during feeding. This can make babies fussy. Burping your baby between breasts can help with this.  Vitamin D supplements are recommended for babies who get only breast milk.  Avoid using a pacifier during your baby's first 4 6 weeks.  Avoid supplemental feedings of water, formula, or juice in place of breastfeeding. Breast milk is all the food your baby needs. It is not necessary for your baby to have water or formula. Your breasts will make more milk if supplemental feedings are avoided during the early weeks. HOW TO TELL  WHETHER YOUR BABY IS GETTING ENOUGH BREAST MILK Wondering whether or not your baby is getting enough milk is a common concern among mothers. You can be assured that your baby is getting enough milk if:   Your baby is actively sucking and you hear swallowing.   Your baby seems relaxed and satisfied after a feeding.   Your baby nurses at least 8 12 times in a 24 hour time period.  During the first 3 5 days of age:  Your baby is wetting at least 3 5 diapers in a 24 hour period. The urine should be clear and pale yellow.  Your baby is having at least 3 4 stools in a 24 hour period. The stool should be soft and yellow.  At 5 7 days of age, your baby is having at least 3 6 stools in a 24 hour period. The stool should be seedy and yellow by 5 days of age.  Your baby has a weight loss less than 7 10% during the first 3 days of age.  Your baby does not lose weight after 3 7 days of age.  Your baby gains 4 7 ounces each week after he or she is 4 days of age.  Your baby gains weight by 5 days of age and is back to birth weight within 2 weeks. ENGORGEMENT In the first week after your baby is born, you may experience extremely full breasts (engorgement). When engorged, your breasts may feel heavy, warm, or tender to the touch. Engorgement peaks within 24 48 hours after delivery of your baby.  Engorgement may be reduced by:  Continuing to breastfeed.  Increasing the frequency of breastfeeding.  Taking warm showers or   applying warm, moist heat to your breasts just before each feeding. This increases circulation and helps the milk flow.   Gently massaging your breast before and during the feedings. With your fingertips, massage from your chest wall towards your nipple in a circular motion.   Ensuring that your baby empties at least one breast at every feeding. It also helps to start the next feeding on the opposite breast.   Expressing breast milk by hand or by using a breast pump to  empty the breasts if your baby is sleepy, or not nursing well. You may also want to express milk if you are returning to work oryou feel you are getting engorged.  Ensuring your baby is latched on and positioned properly while breastfeeding. If you follow these suggestions, your engorgement should improve in 24 48 hours. If you are still experiencing difficulty, call your lactation consultant or caregiver.  CARING FOR YOURSELF Take care of your breasts.  Bathe or shower daily.   Avoid using soap on your nipples.   Wear a supportive bra. Avoid wearing underwire style bras.  Air dry your nipples for a 3 4minutes after each feeding.   Use only cotton bra pads to absorb breast milk leakage. Leaking of breast milk between feedings is normal.   Use only pure lanolin on your nipples after nursing. You do not need to wash it off before feeding your baby again. Another option is to express a few drops of breast milk and gently massage that milk into your nipples.  Continue breast self-awareness checks. Take care of yourself.  Eat healthy foods. Alternate 3 meals with 3 snacks.  Avoid foods that you notice affect your baby in a bad way.  Drink milk, fruit juice, and water to satisfy your thirst (about 8 glasses a day).   Rest often, relax, and take your prenatal vitamins to prevent fatigue, stress, and anemia.  Avoid chewing and smoking tobacco.  Avoid alcohol and drug use.  Take over-the-counter and prescribed medicine only as directed by your caregiver or pharmacist. You should always check with your caregiver or pharmacist before taking any new medicine, vitamin, or herbal supplement.  Know that pregnancy is possible while breastfeeding. If desired, talk to your caregiver about family planning and safe birth control methods that may be used while breastfeeding. SEEK MEDICAL CARE IF:   You feel like you want to stop breastfeeding or have become frustrated with  breastfeeding.  You have painful breasts or nipples.  Your nipples are cracked or bleeding.  Your breasts are red, tender, or warm.  You have a swollen area on either breast.  You have a fever or chills.  You have nausea or vomiting.  You have drainage from your nipples.  Your breasts do not become full before feedings by the 5th day after delivery.  You feel sad and depressed.  Your baby is too sleepy to eat well.  Your baby is having trouble sleeping.   Your baby is wetting less than 3 diapers in a 24 hour period.  Your baby has less than 3 stools in a 24 hour period.  Your baby's skin or the white part of his or her eyes becomes more yellow.   Your baby is not gaining weight by 5 days of age. MAKE SURE YOU:   Understand these instructions.  Will watch your condition.  Will get help right away if you are not doing well or get worse. Document Released: 07/20/2005 Document Revised: 04/13/2012 Document Reviewed:   02/24/2012 [employer] Patient Information 2014 [employer].  

## 2013-06-01 ENCOUNTER — Encounter: Payer: Self-pay | Admitting: Family Medicine

## 2013-06-27 ENCOUNTER — Ambulatory Visit (INDEPENDENT_AMBULATORY_CARE_PROVIDER_SITE_OTHER): Payer: Managed Care, Other (non HMO) | Admitting: Obstetrics & Gynecology

## 2013-06-27 VITALS — BP 120/66 | Wt 157.0 lb

## 2013-06-27 DIAGNOSIS — O09219 Supervision of pregnancy with history of pre-term labor, unspecified trimester: Secondary | ICD-10-CM

## 2013-06-27 DIAGNOSIS — O09211 Supervision of pregnancy with history of pre-term labor, first trimester: Secondary | ICD-10-CM

## 2013-06-27 DIAGNOSIS — Z283 Underimmunization status: Secondary | ICD-10-CM

## 2013-06-27 DIAGNOSIS — O9989 Other specified diseases and conditions complicating pregnancy, childbirth and the puerperium: Secondary | ICD-10-CM

## 2013-06-27 NOTE — Patient Instructions (Signed)
Return to clinic for any obstetric concerns or go to [facility] for evaluation  

## 2013-06-27 NOTE — Progress Notes (Signed)
P= 80 

## 2013-06-27 NOTE — Progress Notes (Signed)
First screen appointment at [facility] is on 07/07/13; will follow up results and manage accordingly.  No other complaints or concerns.  Routine obstetric precautions reviewed.  MSAFP draw next visit.  Will start 17P at 16 weeks.

## 2013-07-05 ENCOUNTER — Other Ambulatory Visit (HOSPITAL_COMMUNITY): Payer: Managed Care, Other (non HMO)

## 2013-07-07 ENCOUNTER — Ambulatory Visit (HOSPITAL_COMMUNITY)
Admission: RE | Admit: 2013-07-07 | Discharge: 2013-07-07 | Disposition: A | Discharge status: Home / Self Care | Payer: Managed Care, Other (non HMO) | Source: Ambulatory Visit | Attending: Family Medicine | Admitting: Family Medicine

## 2013-07-07 VITALS — BP 106/51 | HR 95 | Wt 163.0 lb

## 2013-07-07 DIAGNOSIS — O351XX0 Maternal care for (suspected) chromosomal abnormality in fetus, not applicable or unspecified: Secondary | ICD-10-CM | POA: Insufficient documentation

## 2013-07-07 DIAGNOSIS — Z3490 Encounter for supervision of normal pregnancy, unspecified, unspecified trimester: Secondary | ICD-10-CM

## 2013-07-07 DIAGNOSIS — Z283 Underimmunization status: Secondary | ICD-10-CM

## 2013-07-07 DIAGNOSIS — Z3689 Encounter for other specified antenatal screening: Secondary | ICD-10-CM | POA: Insufficient documentation

## 2013-07-07 DIAGNOSIS — O3510X0 Maternal care for (suspected) chromosomal abnormality in fetus, unspecified, not applicable or unspecified: Secondary | ICD-10-CM | POA: Insufficient documentation

## 2013-07-07 DIAGNOSIS — Z3481 Encounter for supervision of other normal pregnancy, first trimester: Secondary | ICD-10-CM

## 2013-07-12 ENCOUNTER — Other Ambulatory Visit: Payer: Self-pay

## 2013-07-13 ENCOUNTER — Telehealth: Payer: Self-pay | Admitting: *Deleted

## 2013-07-13 NOTE — Telephone Encounter (Signed)
Patients pharmacy is contacting the office for prior authorization of 17-P.  They are requesting records.  Records have been faxed.  Awaiting approval of 17-P

## 2013-07-14 ENCOUNTER — Encounter: Payer: Self-pay | Admitting: *Deleted

## 2013-07-14 DIAGNOSIS — Z3480 Encounter for supervision of other normal pregnancy, unspecified trimester: Secondary | ICD-10-CM

## 2013-07-25 ENCOUNTER — Ambulatory Visit (INDEPENDENT_AMBULATORY_CARE_PROVIDER_SITE_OTHER): Payer: Managed Care, Other (non HMO) | Admitting: Obstetrics and Gynecology

## 2013-07-25 ENCOUNTER — Encounter: Payer: Self-pay | Admitting: Obstetrics and Gynecology

## 2013-07-25 ENCOUNTER — Other Ambulatory Visit: Payer: Self-pay | Admitting: Obstetrics and Gynecology

## 2013-07-25 VITALS — BP 104/66 | Wt 159.6 lb

## 2013-07-25 DIAGNOSIS — R6889 Other general symptoms and signs: Secondary | ICD-10-CM

## 2013-07-25 DIAGNOSIS — O09219 Supervision of pregnancy with history of pre-term labor, unspecified trimester: Secondary | ICD-10-CM

## 2013-07-25 DIAGNOSIS — Z3490 Encounter for supervision of normal pregnancy, unspecified, unspecified trimester: Secondary | ICD-10-CM

## 2013-07-25 DIAGNOSIS — Z3482 Encounter for supervision of other normal pregnancy, second trimester: Secondary | ICD-10-CM

## 2013-07-25 DIAGNOSIS — Z3480 Encounter for supervision of other normal pregnancy, unspecified trimester: Secondary | ICD-10-CM

## 2013-07-25 DIAGNOSIS — Z283 Underimmunization status: Secondary | ICD-10-CM

## 2013-07-25 DIAGNOSIS — O09212 Supervision of pregnancy with history of pre-term labor, second trimester: Secondary | ICD-10-CM

## 2013-07-25 DIAGNOSIS — O9989 Other specified diseases and conditions complicating pregnancy, childbirth and the puerperium: Secondary | ICD-10-CM

## 2013-07-25 DIAGNOSIS — IMO0002 Reserved for concepts with insufficient information to code with codable children: Secondary | ICD-10-CM

## 2013-07-25 NOTE — Progress Notes (Signed)
P - [phone] 

## 2013-07-25 NOTE — Addendum Note (Signed)
Addended by: Barbara Cower on: 07/25/2013 02:15 PM   Modules accepted: Orders

## 2013-07-25 NOTE — Progress Notes (Signed)
Patient doing well, reports that she may have a GI infection as she is experiencing a lot more emesis and diarrhea. No other sick contacts in household. Advised to stay well hydrated. AFP today. Ultrasound scheduled for week of 1/19. Patient informed that insurance company declined 17-P injection coverage. Dr. Shawnie Pons planing to meet with [facility] regarding this matter before contacting insurance company

## 2013-08-03 NOTE — L&D Delivery Note (Signed)
Delivery Note At 6:11 AM a viable and healthy female was delivered via Vaginal, Spontaneous Delivery (Presentation: Right Occiput Transverse).  APGAR: 9, 9; weight .   Placenta status: Intact, Spontaneous.  Cord: 3 vessels with the following complications: None.  Cord pH:N/A  Anesthesia: Epidural  Episiotomy: None Lacerations: Vaginal Suture Repair: 3.0 vicryl rapide Est. Blood Loss (mL): 200  Mom to postpartum.  Baby to Couplet care / Skin to Skin. Mother agrees to IUD for contraceptive; bottle feeding; discussed doing circumcision as outpatient mother/father were undecided   Julia Levine 01/03/2014, 6:51 AM

## 2013-08-08 LAB — MATERNAL SCREEN 4
DIA Value (EIA): 122.87 pg/mL
DSR (By Age)    1 IN: 918
MSAFP: 21.9 ng/mL
MSHCG: 24594 m[IU]/mL
Maternal Age At EDD: 27.1 YEARS
T18 (By Age): 1:3576 {titer}
uE3 Value: 0.42 ng/mL

## 2013-08-08 LAB — SPECIMEN STATUS REPORT

## 2013-08-15 ENCOUNTER — Encounter: Payer: Self-pay | Admitting: Obstetrics & Gynecology

## 2013-08-15 ENCOUNTER — Ambulatory Visit (INDEPENDENT_AMBULATORY_CARE_PROVIDER_SITE_OTHER): Payer: Managed Care, Other (non HMO) | Admitting: Obstetrics & Gynecology

## 2013-08-15 VITALS — BP 122/71 | Wt 161.0 lb

## 2013-08-15 DIAGNOSIS — O09219 Supervision of pregnancy with history of pre-term labor, unspecified trimester: Secondary | ICD-10-CM

## 2013-08-15 DIAGNOSIS — Z3480 Encounter for supervision of other normal pregnancy, unspecified trimester: Secondary | ICD-10-CM

## 2013-08-15 MED ORDER — PROGESTERONE MICRONIZED 200 MG PO CAPS: ORAL_CAPSULE | ORAL | Status: DC

## 2013-08-15 NOTE — Patient Instructions (Signed)
Return to clinic for any obstetric concerns or go to [facility] for evaluation  

## 2013-08-15 NOTE — Progress Notes (Signed)
P=79 

## 2013-08-15 NOTE — Progress Notes (Signed)
Patient seen today for increased pelvic pressure. She has a history of 24 week delivery of twin boys who both died after birth, and she is very nervous.  She wanted to be on 17P for this pregnancy, but her insurance and [employer] declined their assistance as her preterm delivery was for a multiple rather than singleton gestation.  She is very concerned, even though she is reassured by her exam today that shows a closed cervix.  She wants to "do something".  Suggested vaginal progesterone as an alternative, discussed risks/benefits.  Also emphasized that this was an off-label use for this medication.  Patient wants this medication, this was prescribed for her.  Will follow up cervical length during her anatomy scan that is coming up soon.  No other complaints or concerns.  Fetal movement and labor precautions reviewed.

## 2013-08-21 ENCOUNTER — Ambulatory Visit (HOSPITAL_COMMUNITY): Payer: Managed Care, Other (non HMO)

## 2013-08-21 ENCOUNTER — Encounter: Payer: Managed Care, Other (non HMO) | Admitting: Obstetrics & Gynecology

## 2013-08-28 ENCOUNTER — Ambulatory Visit (HOSPITAL_COMMUNITY)
Admission: RE | Admit: 2013-08-28 | Discharge: 2013-08-28 | Disposition: A | Discharge status: Home / Self Care | Payer: Managed Care, Other (non HMO) | Source: Ambulatory Visit | Attending: Obstetrics and Gynecology | Admitting: Obstetrics and Gynecology

## 2013-08-28 DIAGNOSIS — Z3689 Encounter for other specified antenatal screening: Secondary | ICD-10-CM | POA: Insufficient documentation

## 2013-08-28 DIAGNOSIS — Z3482 Encounter for supervision of other normal pregnancy, second trimester: Secondary | ICD-10-CM

## 2013-08-28 DIAGNOSIS — Z8751 Personal history of pre-term labor: Secondary | ICD-10-CM | POA: Insufficient documentation

## 2013-08-28 DIAGNOSIS — O09212 Supervision of pregnancy with history of pre-term labor, second trimester: Secondary | ICD-10-CM

## 2013-08-29 ENCOUNTER — Encounter: Payer: Self-pay | Admitting: Obstetrics and Gynecology

## 2013-09-11 ENCOUNTER — Ambulatory Visit (INDEPENDENT_AMBULATORY_CARE_PROVIDER_SITE_OTHER): Payer: Managed Care, Other (non HMO) | Admitting: Obstetrics & Gynecology

## 2013-09-11 VITALS — BP 122/70 | Wt 168.0 lb

## 2013-09-11 DIAGNOSIS — Z2839 Other underimmunization status: Secondary | ICD-10-CM

## 2013-09-11 DIAGNOSIS — O09899 Supervision of other high risk pregnancies, unspecified trimester: Secondary | ICD-10-CM

## 2013-09-11 DIAGNOSIS — Z3480 Encounter for supervision of other normal pregnancy, unspecified trimester: Secondary | ICD-10-CM

## 2013-09-11 DIAGNOSIS — O09219 Supervision of pregnancy with history of pre-term labor, unspecified trimester: Secondary | ICD-10-CM

## 2013-09-11 DIAGNOSIS — O9989 Other specified diseases and conditions complicating pregnancy, childbirth and the puerperium: Secondary | ICD-10-CM

## 2013-09-11 DIAGNOSIS — Z283 Underimmunization status: Secondary | ICD-10-CM

## 2013-09-11 NOTE — Patient Instructions (Signed)
Return to clinic for any obstetric concerns or go to [facility] for evaluation  

## 2013-09-11 NOTE — Progress Notes (Signed)
Cervical length scans ordered every 2 weeks until 28 weeks, cervical length 08/28/13 3.1 cm No other complaints or concerns.  Routine obstetric precautions reviewed.

## 2013-09-11 NOTE — Progress Notes (Signed)
P - [phone] 

## 2013-09-23 ENCOUNTER — Inpatient Hospital Stay (HOSPITAL_COMMUNITY)
Admission: AD | Admit: 2013-09-23 | Discharge: 2013-09-23 | Disposition: A | Discharge status: Home / Self Care | Payer: Managed Care, Other (non HMO) | Source: Ambulatory Visit | Attending: Obstetrics & Gynecology | Admitting: Obstetrics & Gynecology

## 2013-09-23 ENCOUNTER — Encounter (HOSPITAL_COMMUNITY): Payer: Self-pay | Admitting: *Deleted

## 2013-09-23 DIAGNOSIS — O26892 Other specified pregnancy related conditions, second trimester: Secondary | ICD-10-CM

## 2013-09-23 DIAGNOSIS — O368390 Maternal care for abnormalities of the fetal heart rate or rhythm, unspecified trimester, not applicable or unspecified: Secondary | ICD-10-CM | POA: Insufficient documentation

## 2013-09-23 DIAGNOSIS — O99891 Other specified diseases and conditions complicating pregnancy: Secondary | ICD-10-CM | POA: Insufficient documentation

## 2013-09-23 DIAGNOSIS — O9989 Other specified diseases and conditions complicating pregnancy, childbirth and the puerperium: Secondary | ICD-10-CM

## 2013-09-23 DIAGNOSIS — O4700 False labor before 37 completed weeks of gestation, unspecified trimester: Secondary | ICD-10-CM | POA: Insufficient documentation

## 2013-09-23 DIAGNOSIS — N898 Other specified noninflammatory disorders of vagina: Secondary | ICD-10-CM

## 2013-09-23 LAB — OB RESULTS CONSOLE GC/CHLAMYDIA
Chlamydia: NEGATIVE
GC PROBE AMP, GENITAL: NEGATIVE

## 2013-09-23 LAB — URINALYSIS, ROUTINE W REFLEX MICROSCOPIC
Bilirubin Urine: NEGATIVE
Glucose, UA: NEGATIVE mg/dL
HGB URINE DIPSTICK: NEGATIVE
Ketones, ur: NEGATIVE mg/dL
Leukocytes, UA: NEGATIVE
Nitrite: NEGATIVE
Protein, ur: NEGATIVE mg/dL
SPECIFIC GRAVITY, URINE: 1.015 (ref 1.005–1.030)
UROBILINOGEN UA: 0.2 mg/dL (ref 0.0–1.0)
pH: 7 (ref 5.0–8.0)

## 2013-09-23 LAB — WET PREP, GENITAL
Clue Cells Wet Prep HPF POC: NONE SEEN
Trich, Wet Prep: NONE SEEN
Yeast Wet Prep HPF POC: NONE SEEN

## 2013-09-23 NOTE — [facility] Provider Note (Signed)
Attestation of Attending Supervision of Fellow: Evaluation and management procedures were performed by the Fellow under my supervision and collaboration.  I have reviewed the Fellow's note and chart, and I agree with the management and plan.    

## 2013-09-23 NOTE — MAU Provider Note (Signed)
Chief Complaint:  Lost mucus plug    Julia Levine is a 28 y.o.  [phone] with IUP at 264w6d presenting for Lost mucus plug   Pt states that last night she passed a large green clot of mucous. And this AM passed a little more. Thought it was her mucous plug so decided to come in to be evaluated. Denies ctx, LOF, VB.  +FM.     [facility]. Pregnancy has been uncomplicated thus far. She does have a hx of PTL with twins at 24 weeks and likely PPROM.  Insurance would not approve 17-OHP so she has been on PV progresterone instead.  She denies any other concerns. Has a repeat CL scheduled for Monday (last one 3 weeks ago was 3.1cm).       Menstrual History: OB History   Grav Para Term Preterm Abortions TAB SAB Ect Mult Living   4 2 1 1 1  0 1 0 1 1      G1- Term, NSVD G2- SAB G3- twins, 24 week, SVD, neonatal demise 75- current   Patient's last menstrual period was 04/09/2013.      Past Medical History  Diagnosis Date  . 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 Colposcopy  10/2011    abnormal pap HPV    Family History  Problem Relation Age of Onset  . Hypertension Mother     History  Substance Use Topics  . Smoking status: Never Smoker   . Smokeless tobacco: Never Used  . Alcohol Use: No     No Known Allergies  Prescriptions prior to admission  Medication Sig Dispense Refill  . Prenatal Vit-Fe Fumarate-FA (PRENATAL MULTIVITAMIN) TABS tablet Take 1 tablet by mouth daily at 12 noon.      . progesterone (PROMETRIUM) 200 MG capsule Place one capsule vaginally at bedtime  30 capsule  10    Review of Systems - Negative except for what is mentioned in HPI.  Physical Exam  Blood pressure 107/59, pulse 94, temperature 97.2 F (36.2 C), temperature source Oral, resp. rate 18, last menstrual period 04/09/2013. GENERAL: Well-developed, well-nourished female in no acute distress.  LUNGS: Clear to  auscultation bilaterally.  HEART: Regular rate and rhythm. ABDOMEN: Soft, nontender, nondistended, gravid.  EXTREMITIES: Nontender, no edema, 2+ distal pulses. GU: NEFG, normal vagina, some thick mucousy discharge, visually long and closed cervix.  No bleeding.   Cervical Exam: Dilatation 0cm   Effacement thick%   Station high    FHT:  Baseline rate 145 bpm   Variability moderate  Accelerations present   Decelerations occasional variables Contractions: quiet   Labs: No results found for this or any previous visit (from the past 24 hour(s)).  Imaging Studies:  None today   Assessment: Julia Levine is  28 y.o. [phone] at 2364w6d presents with Lost mucus plug  .  Plan:  Vaginal exam with some white, thick mucous discharge but no pooling or bleeding.  Wet prep neg Gc/chl pending  - discharge likely in part due to the PV progesterone.  - no contractions or cramping or pressure concerning for cervical shortening and cervix long and closed on exam.  Reassurance given Will have her f/u as scheduled on Monday for CL  Discussed with Dr. Laurel DimmerHarraway-Smith   Journie Howson L 2/21/20152:42 PM

## 2013-09-23 NOTE — Discharge Instructions (Signed)
Preterm Labor Information Preterm labor is when labor starts at less than 37 weeks of pregnancy. The normal length of a pregnancy is 39 to 41 weeks. CAUSES Often, there is no identifiable underlying cause as to why a woman goes into preterm labor. One of the most common known causes of preterm labor is infection. Infections of the uterus, cervix, vagina, amniotic sac, bladder, kidney, or even the lungs (pneumonia) can cause labor to start. Other suspected causes of preterm labor include:   Urogenital infections, such as yeast infections and bacterial vaginosis.   Uterine abnormalities (uterine shape, uterine septum, fibroids, or bleeding from the placenta).   A cervix that has been operated on (it may fail to stay closed).   Malformations in the fetus.   Multiple gestations (twins, triplets, and so on).   Breakage of the amniotic sac.  RISK FACTORS  Having a previous history of preterm labor.   Having premature rupture of membranes (PROM).   Having a placenta that covers the opening of the cervix (placenta previa).   Having a placenta that separates from the uterus (placental abruption).   Having a cervix that is too weak to hold the fetus in the uterus (incompetent cervix).   Having too much fluid in the amniotic sac (polyhydramnios).   Taking illegal drugs or smoking while pregnant.   Not gaining enough weight while pregnant.   Being younger than 18 and older than 27 years old.   Having a low socioeconomic status.   Being African American. SYMPTOMS Signs and symptoms of preterm labor include:   Menstrual-like cramps, abdominal pain, or back pain.  Uterine contractions that are regular, as frequent as six in an hour, regardless of their intensity (may be mild or painful).  Contractions that start on the top of the uterus and spread down to the lower abdomen and back.   A sense of increased pelvic pressure.   A watery or bloody mucus discharge that  comes from the vagina.  TREATMENT Depending on the length of the pregnancy and other circumstances, your health care provider may suggest bed rest. If necessary, there are medicines that can be given to stop contractions and to mature the fetal lungs. If labor happens before 34 weeks of pregnancy, a prolonged hospital stay may be recommended. Treatment depends on the condition of both you and the fetus.  WHAT SHOULD YOU DO IF YOU THINK YOU ARE IN PRETERM LABOR? Call your health care provider right away. You will need to go to the hospital to get checked immediately. HOW CAN YOU PREVENT PRETERM LABOR IN FUTURE PREGNANCIES? You should:   Stop smoking if you smoke.  Maintain healthy weight gain and avoid chemicals and drugs that are not necessary.  Be watchful for any type of infection.  Inform your health care provider if you have a known history of preterm labor. Document Released: 10/10/2003 Document Revised: 03/22/2013 Document Reviewed: 08/22/2012 [employer] Patient Information 2014 [employer].    

## 2013-09-23 NOTE — MAU Note (Signed)
Pt states lost mucus plug last pm, then noted more this am, last intercourse 2 days ago. Denies bleeding.

## 2013-09-25 ENCOUNTER — Ambulatory Visit (HOSPITAL_COMMUNITY)
Admission: RE | Admit: 2013-09-25 | Discharge: 2013-09-25 | Disposition: A | Discharge status: Home / Self Care | Payer: Managed Care, Other (non HMO) | Source: Ambulatory Visit | Attending: Obstetrics & Gynecology | Admitting: Obstetrics & Gynecology

## 2013-09-25 ENCOUNTER — Telehealth: Payer: Self-pay | Admitting: *Deleted

## 2013-09-25 DIAGNOSIS — O09299 Supervision of pregnancy with other poor reproductive or obstetric history, unspecified trimester: Secondary | ICD-10-CM | POA: Insufficient documentation

## 2013-09-25 DIAGNOSIS — O09219 Supervision of pregnancy with history of pre-term labor, unspecified trimester: Principal | ICD-10-CM

## 2013-09-25 DIAGNOSIS — O3430 Maternal care for cervical incompetence, unspecified trimester: Secondary | ICD-10-CM | POA: Insufficient documentation

## 2013-09-25 DIAGNOSIS — O09899 Supervision of other high risk pregnancies, unspecified trimester: Secondary | ICD-10-CM

## 2013-09-25 LAB — GC/CHLAMYDIA PROBE AMP
CT Probe RNA: NEGATIVE
GC PROBE AMP APTIMA: NEGATIVE

## 2013-09-25 NOTE — Telephone Encounter (Signed)
Patient needs new order for two week follow up ultrasound as per Dr. Macon LargeAnyanwu instructions.  I have added order for ultrasound to check cervical length every two weeks until 28 weeks of pregnancy.  [facility]

## 2013-10-09 ENCOUNTER — Encounter: Payer: Self-pay | Admitting: Obstetrics & Gynecology

## 2013-10-09 ENCOUNTER — Encounter: Payer: Self-pay | Admitting: Family Medicine

## 2013-10-09 ENCOUNTER — Ambulatory Visit (HOSPITAL_COMMUNITY)
Admission: RE | Admit: 2013-10-09 | Discharge: 2013-10-09 | Disposition: A | Discharge status: Home / Self Care | Payer: Managed Care, Other (non HMO) | Source: Ambulatory Visit | Attending: Obstetrics & Gynecology | Admitting: Obstetrics & Gynecology

## 2013-10-09 ENCOUNTER — Other Ambulatory Visit: Payer: Self-pay | Admitting: Obstetrics & Gynecology

## 2013-10-09 ENCOUNTER — Ambulatory Visit (INDEPENDENT_AMBULATORY_CARE_PROVIDER_SITE_OTHER): Payer: Managed Care, Other (non HMO) | Admitting: Family Medicine

## 2013-10-09 VITALS — BP 125/74 | Wt 176.0 lb

## 2013-10-09 DIAGNOSIS — O3430 Maternal care for cervical incompetence, unspecified trimester: Secondary | ICD-10-CM | POA: Insufficient documentation

## 2013-10-09 DIAGNOSIS — O09899 Supervision of other high risk pregnancies, unspecified trimester: Secondary | ICD-10-CM

## 2013-10-09 DIAGNOSIS — O09219 Supervision of pregnancy with history of pre-term labor, unspecified trimester: Secondary | ICD-10-CM

## 2013-10-09 NOTE — Patient Instructions (Addendum)
Breastfeeding Deciding to breastfeed is one of the best choices you can make for you and your baby. A change in hormones during pregnancy causes your breast tissue to grow and increases the number and size of your milk ducts. These hormones also allow proteins, sugars, and fats from your blood supply to make breast milk in your milk-producing glands. Hormones prevent breast milk from being released before your baby is born as well as prompt milk flow after birth. Once breastfeeding has begun, thoughts of your baby, as well as his or her sucking or crying, can stimulate the release of milk from your milk-producing glands.  BENEFITS OF BREASTFEEDING For Your Baby  Your first milk (colostrum) helps your baby's digestive system function better.   There are antibodies in your milk that help your baby fight off infections.   Your baby has a lower incidence of asthma, allergies, and sudden infant death syndrome.   The nutrients in breast milk are better for your baby than infant formulas and are designed uniquely for your baby's needs.   Breast milk improves your baby's brain development.   Your baby is less likely to develop other conditions, such as childhood obesity, asthma, or type 2 diabetes mellitus.  For You   Breastfeeding helps to create a very special bond between you and your baby.   Breastfeeding is convenient. Breast milk is always available at the correct temperature and costs nothing.   Breastfeeding helps to burn calories and helps you lose the weight gained during pregnancy.   Breastfeeding makes your uterus contract to its prepregnancy size faster and slows bleeding (lochia) after you give birth.   Breastfeeding helps to lower your risk of developing type 2 diabetes mellitus, osteoporosis, and breast or ovarian cancer later in life. SIGNS THAT YOUR BABY IS HUNGRY Early Signs of Hunger  Increased alertness or activity.  Stretching.  Movement of the head from  side to side.  Movement of the head and opening of the mouth when the corner of the mouth or cheek is stroked (rooting).  Increased sucking sounds, smacking lips, cooing, sighing, or squeaking.  Hand-to-mouth movements.  Increased sucking of fingers or hands. Late Signs of Hunger  Fussing.  Intermittent crying. Extreme Signs of Hunger Signs of extreme hunger will require calming and consoling before your baby will be able to breastfeed successfully. Do not wait for the following signs of extreme hunger to occur before you initiate breastfeeding:   Restlessness.  A loud, strong cry.   Screaming. BREASTFEEDING BASICS Breastfeeding Initiation  Find a comfortable place to sit or lie down, with your neck and back well supported.  Place a pillow or rolled up blanket under your baby to bring him or her to the level of your breast (if you are seated). Nursing pillows are specially designed to help support your arms and your baby while you breastfeed.  Make sure that your baby's abdomen is facing your abdomen.   Gently massage your breast. With your fingertips, massage from your chest wall toward your nipple in a circular motion. This encourages milk flow. You may need to continue this action during the feeding if your milk flows slowly.  Support your breast with 4 fingers underneath and your thumb above your nipple. Make sure your fingers are well away from your nipple and your baby's mouth.   Stroke your baby's lips gently with your finger or nipple.   When your baby's mouth is open wide enough, quickly bring your baby to your   breast, placing your entire nipple and as much of the colored area around your nipple (areola) as possible into your baby's mouth.   More areola should be visible above your baby's upper lip than below the lower lip.   Your baby's tongue should be between his or her lower gum and your breast.   Ensure that your baby's mouth is correctly positioned  around your nipple (latched). Your baby's lips should create a seal on your breast and be turned out (everted).  It is common for your baby to suck about 2 3 minutes in order to start the flow of breast milk. Latching Teaching your baby how to latch on to your breast properly is very important. An improper latch can cause nipple pain and decreased milk supply for you and poor weight gain in your baby. Also, if your baby is not latched onto your nipple properly, he or she may swallow some air during feeding. This can make your baby fussy. Burping your baby when you switch breasts during the feeding can help to get rid of the air. However, teaching your baby to latch on properly is still the best way to prevent fussiness from swallowing air while breastfeeding. Signs that your baby has successfully latched on to your nipple:    Silent tugging or silent sucking, without causing you pain.   Swallowing heard between every 3 4 sucks.    Muscle movement above and in front of his or her ears while sucking.  Signs that your baby has not successfully latched on to nipple:   Sucking sounds or smacking sounds from your baby while breastfeeding.  Nipple pain. If you think your baby has not latched on correctly, slip your finger into the corner of your baby's mouth to break the suction and place it between your baby's gums. Attempt breastfeeding initiation again. Signs of Successful Breastfeeding Signs from your baby:   A gradual decrease in the number of sucks or complete cessation of sucking.   Falling asleep.   Relaxation of his or her body.   Retention of a small amount of milk in his or her mouth.   Letting go of your breast by himself or herself. Signs from you:  Breasts that have increased in firmness, weight, and size 1 3 hours after feeding.   Breasts that are softer immediately after breastfeeding.  Increased milk volume, as well as a change in milk consistency and color by  the 5th day of breastfeeding.   Nipples that are not sore, cracked, or bleeding. Signs That Your Randel Books is Getting Enough Milk  Wetting at least 3 diapers in a 24-hour period. The urine should be clear and pale yellow by age 64411 days.  At least 3 stools in a 24-hour period by age 64411 days. The stool should be soft and yellow.  At least 3 stools in a 24-hour period by age 644 days. The stool should be seedy and yellow.  No loss of weight greater than 10% of birth weight during the first 22 days of age.  Average weight gain of 4 7 ounces (120 210 mL) per week after age 64 days.  Consistent daily weight gain by age 60 days, without weight loss after the age of 2 weeks. After a feeding, your baby may spit up a small amount. This is common. BREASTFEEDING FREQUENCY AND DURATION Frequent feeding will help you make more milk and can prevent sore nipples and breast engorgement. Breastfeed when you feel the need to reduce  the fullness of your breasts or when your baby shows signs of hunger. This is called "breastfeeding on demand." Avoid introducing a pacifier to your baby while you are working to establish breastfeeding (the first 4 6 weeks after your baby is born). After this time you may choose to use a pacifier. Research has shown that pacifier use during the first year of a baby's life decreases the risk of sudden infant death syndrome (SIDS). Allow your baby to feed on each breast as long as he or she wants. Breastfeed until your baby is finished feeding. When your baby unlatches or falls asleep while feeding from the first breast, offer the second breast. Because newborns are often sleepy in the first few weeks of life, you may need to awaken your baby to get him or her to feed. Breastfeeding times will vary from baby to baby. However, the following rules can serve as a guide to help you ensure that your baby is properly fed:  Newborns (babies 4 weeks of age or younger) may breastfeed every 1 3  hours.  Newborns should not go longer than 3 hours during the day or 5 hours during the night without breastfeeding.  You should breastfeed your baby a minimum of 8 times in a 24-hour period until you begin to introduce solid foods to your baby at around 6 months of age. BREAST MILK PUMPING Pumping and storing breast milk allows you to ensure that your baby is exclusively fed your breast milk, even at times when you are unable to breastfeed. This is especially important if you are going back to work while you are still breastfeeding or when you are not able to be present during feedings. Your lactation consultant can give you guidelines on how long it is safe to store breast milk.  A breast pump is a machine that allows you to pump milk from your breast into a sterile bottle. The pumped breast milk can then be stored in a refrigerator or freezer. Some breast pumps are operated by hand, while others use electricity. Ask your lactation consultant which type will work best for you. Breast pumps can be purchased, but some hospitals and breastfeeding support groups lease breast pumps on a monthly basis. A lactation consultant can teach you how to hand express breast milk, if you prefer not to use a pump.  CARING FOR YOUR BREASTS WHILE YOU BREASTFEED Nipples can become dry, cracked, and sore while breastfeeding. The following recommendations can help keep your breasts moisturized and healthy:  Avoid using soap on your nipples.   Wear a supportive bra. Although not required, special nursing bras and tank tops are designed to allow access to your breasts for breastfeeding without taking off your entire bra or top. Avoid wearing underwire style bras or extremely tight bras.  Air dry your nipples for 3 4minutes after each feeding.   Use only cotton bra pads to absorb leaked breast milk. Leaking of breast milk between feedings is normal.   Use lanolin on your nipples after breastfeeding. Lanolin helps to  maintain your skin's normal moisture barrier. If you use pure lanolin you do not need to wash it off before feeding your baby again. Pure lanolin is not toxic to your baby. You may also hand express a few drops of breast milk and gently massage that milk into your nipples and allow the milk to air dry. In the first few weeks after giving birth, some women experience extremely full breasts (engorgement). Engorgement can make   your breasts feel heavy, warm, and tender to the touch. Engorgement peaks within 3 5 days after you give birth. The following recommendations can help ease engorgement:  Completely empty your breasts while breastfeeding or pumping. You may want to start by applying warm, moist heat (in the shower or with warm water-soaked hand towels) just before feeding or pumping. This increases circulation and helps the milk flow. If your baby does not completely empty your breasts while breastfeeding, pump any extra milk after he or she is finished.  Wear a snug bra (nursing or regular) or tank top for 1 2 days to signal your body to slightly decrease milk production.  Apply ice packs to your breasts, unless this is too uncomfortable for you.  Make sure that your baby is latched on and positioned properly while breastfeeding. If engorgement persists after 48 hours of following these recommendations, contact your health care provider or a Advertising copywriterlactation consultant. OVERALL HEALTH CARE RECOMMENDATIONS WHILE BREASTFEEDING  Eat healthy foods. Alternate between meals and snacks, eating 3 of each per day. Because what you eat affects your breast milk, some of the foods may make your baby more irritable than usual. Avoid eating these foods if you are sure that they are negatively affecting your baby.  Drink milk, fruit juice, and water to satisfy your thirst (about 10 glasses a day).   Rest often, relax, and continue to take your prenatal vitamins to prevent fatigue, stress, and anemia.  Continue  breast self-awareness checks.  Avoid chewing and smoking tobacco.  Avoid alcohol and drug use. Some medicines that may be harmful to your baby can pass through breast milk. It is important to ask your health care provider before taking any medicine, including all over-the-counter and prescription medicine as well as vitamin and herbal supplements. It is possible to become pregnant while breastfeeding. If birth control is desired, ask your health care provider about options that will be safe for your baby. SEEK MEDICAL CARE IF:   You feel like you want to stop breastfeeding or have become frustrated with breastfeeding.  You have painful breasts or nipples.  Your nipples are cracked or bleeding.  Your breasts are red, tender, or warm.  You have a swollen area on either breast.  You have a fever or chills.  You have nausea or vomiting.  You have drainage other than breast milk from your nipples.  Your breasts do not become full before feedings by the 5th day after you give birth.  You feel sad and depressed.  Your baby is too sleepy to eat well.  Your baby is having trouble sleeping.   Your baby is wetting less than 3 diapers in a 24-hour period.  Your baby has less than 3 stools in a 24-hour period.  Your baby's skin or the white part of his or her eyes becomes yellow.   Your baby is not gaining weight by 115 days of age. SEEK IMMEDIATE MEDICAL CARE IF:   Your baby is overly tired (lethargic) and does not want to wake up and feed.  Your baby develops an unexplained fever. Document Released: 07/20/2005 Document Revised: 03/22/2013 Document Reviewed: 01/11/2013 [facility] Patient Information 2014 [place]. Preterm Labor Information Preterm labor is when labor starts at less than 37 weeks of pregnancy. The normal length of a pregnancy is 39 to 41 weeks. CAUSES Often, there is no identifiable underlying cause as to why a woman goes into preterm labor. One of the most  common known causes of  preterm labor is infection. Infections of the uterus, cervix, vagina, amniotic sac, bladder, kidney, or even the lungs (pneumonia) can cause labor to start. Other suspected causes of preterm labor include:   Urogenital infections, such as yeast infections and bacterial vaginosis.   Uterine abnormalities (uterine shape, uterine septum, fibroids, or bleeding from the placenta).   A cervix that has been operated on (it may fail to stay closed).   Malformations in the fetus.   Multiple gestations (twins, triplets, and so on).   Breakage of the amniotic sac.  RISK FACTORS  Having a previous history of preterm labor.   Having premature rupture of membranes (PROM).   Having a placenta that covers the opening of the cervix (placenta previa).   Having a placenta that separates from the uterus (placental abruption).   Having a cervix that is too weak to hold the fetus in the uterus (incompetent cervix).   Having too much fluid in the amniotic sac (polyhydramnios).   Taking illegal drugs or smoking while pregnant.   Not gaining enough weight while pregnant.   Being younger than 7 and older than 27 years old.   Having a low socioeconomic status.   Being African American. SYMPTOMS Signs and symptoms of preterm labor include:   Menstrual-like cramps, abdominal pain, or back pain.  Uterine contractions that are regular, as frequent as six in an hour, regardless of their intensity (may be mild or painful).  Contractions that start on the top of the uterus and spread down to the lower abdomen and back.   A sense of increased pelvic pressure.   A watery or bloody mucus discharge that comes from the vagina.  TREATMENT Depending on the length of the pregnancy and other circumstances, your health care provider may suggest bed rest. If necessary, there are medicines that can be given to stop contractions and to mature the fetal lungs. If labor  happens before 34 weeks of pregnancy, a prolonged hospital stay may be recommended. Treatment depends on the condition of both you and the fetus.  WHAT SHOULD YOU DO IF YOU THINK YOU ARE IN PRETERM LABOR? Call your health care provider right away. You will need to go to the hospital to get checked immediately. HOW CAN YOU PREVENT PRETERM LABOR IN FUTURE PREGNANCIES? You should:   Stop smoking if you smoke.  Maintain healthy weight gain and avoid chemicals and drugs that are not necessary.  Be watchful for any type of infection.  Inform your health care provider if you have a known history of preterm labor. Document Released: 10/10/2003 Document Revised: 03/22/2013 Document Reviewed: 08/22/2012 [facility] Patient Information 2014 [place], [place].

## 2013-10-09 NOTE — Progress Notes (Signed)
MFM ultrasound  Indication: 28 yr old [id number] at 5343w1d with history of preterm delivery of twins for cervical length. Remote read.  Findings: 1. Single intrauterine pregnancy. 2. Posterior placenta without evidence of previa. 3. Normal amniotic fluid volume. 4. Normal transvaginal cervical length. No funneling is seen.  Recommendations: 1. Previous preterm delivery: - previously counseled - on vaginal progesterone - recommend continue cervical length surveillance until 28 weeks - recommend preterm labor precautions 2. Prior limited anatomy survey: - recommend reattempt to complete anatomy survey on follow up ultrasound  Eulis FosterKristen Lamya Lausch, MD

## 2013-10-09 NOTE — Progress Notes (Signed)
P = 92 

## 2013-10-09 NOTE — Progress Notes (Signed)
Cervix was 3.8 cm today Having some cramping and low back pain yesterday but now resolved. Still on Prometrium S>D add growth to next u/s for cervical length in 2 wks 28 wk labs next visit.

## 2013-10-17 ENCOUNTER — Other Ambulatory Visit: Payer: Managed Care, Other (non HMO) | Admitting: *Deleted

## 2013-10-17 ENCOUNTER — Encounter: Payer: Self-pay | Admitting: *Deleted

## 2013-10-17 ENCOUNTER — Ambulatory Visit (INDEPENDENT_AMBULATORY_CARE_PROVIDER_SITE_OTHER): Payer: Managed Care, Other (non HMO) | Admitting: Obstetrics and Gynecology

## 2013-10-17 VITALS — BP 121/74 | Wt 180.0 lb

## 2013-10-17 DIAGNOSIS — R030 Elevated blood-pressure reading, without diagnosis of hypertension: Secondary | ICD-10-CM

## 2013-10-17 DIAGNOSIS — O99891 Other specified diseases and conditions complicating pregnancy: Secondary | ICD-10-CM

## 2013-10-17 DIAGNOSIS — O9989 Other specified diseases and conditions complicating pregnancy, childbirth and the puerperium: Secondary | ICD-10-CM

## 2013-10-17 NOTE — Progress Notes (Addendum)
Pt here today for a blood pressure check.  Pt had a co-worker check her blood pressure earlier today and got a high reading.  Pt has had some dizziness and increase in swelling in her legs and feet.   Blood pressure today in the office was good.  FHR-140

## 2013-10-17 NOTE — Progress Notes (Signed)
P-118.  Pt is here today because she had some high blood pressure reading earlier at work (160/100) (140/84) and she felt dizzy at the time.  Pt says the dizziness comes and goes.  She has also noticed an increase in swelling in her feet/ankles/leg.

## 2013-10-23 ENCOUNTER — Ambulatory Visit (INDEPENDENT_AMBULATORY_CARE_PROVIDER_SITE_OTHER): Payer: Managed Care, Other (non HMO) | Admitting: Obstetrics & Gynecology

## 2013-10-23 ENCOUNTER — Other Ambulatory Visit: Payer: Self-pay | Admitting: Family Medicine

## 2013-10-23 ENCOUNTER — Ambulatory Visit (HOSPITAL_COMMUNITY)
Admission: RE | Admit: 2013-10-23 | Discharge: 2013-10-23 | Disposition: A | Discharge status: Home / Self Care | Payer: Managed Care, Other (non HMO) | Source: Ambulatory Visit | Attending: Family Medicine | Admitting: Family Medicine

## 2013-10-23 VITALS — BP 135/78 | Wt 180.0 lb

## 2013-10-23 DIAGNOSIS — Z3492 Encounter for supervision of normal pregnancy, unspecified, second trimester: Secondary | ICD-10-CM

## 2013-10-23 DIAGNOSIS — Z23 Encounter for immunization: Secondary | ICD-10-CM

## 2013-10-23 DIAGNOSIS — O09219 Supervision of pregnancy with history of pre-term labor, unspecified trimester: Secondary | ICD-10-CM

## 2013-10-23 DIAGNOSIS — Z3480 Encounter for supervision of other normal pregnancy, unspecified trimester: Secondary | ICD-10-CM

## 2013-10-23 DIAGNOSIS — O3430 Maternal care for cervical incompetence, unspecified trimester: Secondary | ICD-10-CM | POA: Insufficient documentation

## 2013-10-23 LAB — CBC
HCT: 34 % — ABNORMAL LOW (ref 36.0–46.0)
Hemoglobin: 11.5 g/dL — ABNORMAL LOW (ref 12.0–15.0)
MCH: 31.1 pg (ref 26.0–34.0)
MCHC: 33.8 g/dL (ref 30.0–36.0)
MCV: 91.9 fL (ref 78.0–100.0)
Platelets: 167 10*3/uL (ref 150–400)
RBC: 3.7 MIL/uL — AB (ref 3.87–5.11)
RDW: 13.8 % (ref 11.5–15.5)
WBC: 7.1 10*3/uL (ref 4.0–10.5)

## 2013-10-23 MED ORDER — TETANUS-DIPHTH-ACELL PERTUSSIS 5-2.5-18.5 LF-MCG/0.5 IM SUSP: 0.5000 mL | Freq: Once | INTRAMUSCULAR | Status: DC

## 2013-10-23 NOTE — Progress Notes (Signed)
P-115

## 2013-10-23 NOTE — Progress Notes (Signed)
Routine visit. Good FM. No problems. Cervical length 2.6 with funneling. I spoke with Dr. Marjo Bickerenney who says that this is fine and we should not check any more cervical lengths unless she has symptomatic contractions. Labs, TDAP, glucola today.

## 2013-10-24 LAB — RPR

## 2013-10-24 LAB — HIV ANTIBODY (ROUTINE TESTING W REFLEX): HIV: NONREACTIVE

## 2013-10-25 ENCOUNTER — Encounter: Payer: Self-pay | Admitting: Obstetrics & Gynecology

## 2013-10-25 LAB — GLUCOSE TOLERANCE, 1 HOUR (50G) W/O FASTING: Glucose, 1 Hour GTT: 133 mg/dL (ref 70–140)

## 2013-11-07 ENCOUNTER — Encounter: Payer: Self-pay | Admitting: Family Medicine

## 2013-11-07 ENCOUNTER — Ambulatory Visit (INDEPENDENT_AMBULATORY_CARE_PROVIDER_SITE_OTHER): Payer: Managed Care, Other (non HMO) | Admitting: Family Medicine

## 2013-11-07 VITALS — BP 131/75 | Wt 187.0 lb

## 2013-11-07 DIAGNOSIS — O09219 Supervision of pregnancy with history of pre-term labor, unspecified trimester: Secondary | ICD-10-CM

## 2013-11-07 DIAGNOSIS — Z3480 Encounter for supervision of other normal pregnancy, unspecified trimester: Secondary | ICD-10-CM

## 2013-11-07 MED ORDER — BETAMETHASONE SOD PHOS & ACET 6 (3-3) MG/ML IJ SUSP
12.0000 mg | Freq: Once | INTRAMUSCULAR | Status: AC
  Administered 2013-11-07: 12 mg via INTRAMUSCULAR

## 2013-11-07 NOTE — Progress Notes (Signed)
Having pressure--will check cervix--cervix is posterior and has good tone. Given some cervical dilation--will give BMZ today and tomorrow--out of work until 34 wks. Preterm labor precautions.

## 2013-11-07 NOTE — Patient Instructions (Addendum)
Breastfeeding Deciding to breastfeed is one of the best choices you can make for you and your baby. A change in hormones during pregnancy causes your breast tissue to grow and increases the number and size of your milk ducts. These hormones also allow proteins, sugars, and fats from your blood supply to make breast milk in your milk-producing glands. Hormones prevent breast milk from being released before your baby is born as well as prompt milk flow after birth. Once breastfeeding has begun, thoughts of your baby, as well as his or her sucking or crying, can stimulate the release of milk from your milk-producing glands.  BENEFITS OF BREASTFEEDING For Your Baby  Your first milk (colostrum) helps your baby's digestive system function better.   There are antibodies in your milk that help your baby fight off infections.   Your baby has a lower incidence of asthma, allergies, and sudden infant death syndrome.   The nutrients in breast milk are better for your baby than infant formulas and are designed uniquely for your baby's needs.   Breast milk improves your baby's brain development.   Your baby is less likely to develop other conditions, such as childhood obesity, asthma, or type 2 diabetes mellitus.  For You   Breastfeeding helps to create a very special bond between you and your baby.   Breastfeeding is convenient. Breast milk is always available at the correct temperature and costs nothing.   Breastfeeding helps to burn calories and helps you lose the weight gained during pregnancy.   Breastfeeding makes your uterus contract to its prepregnancy size faster and slows bleeding (lochia) after you give birth.   Breastfeeding helps to lower your risk of developing type 2 diabetes mellitus, osteoporosis, and breast or ovarian cancer later in life. SIGNS THAT YOUR BABY IS HUNGRY Early Signs of Hunger  Increased alertness or activity.  Stretching.  Movement of the head from  side to side.  Movement of the head and opening of the mouth when the corner of the mouth or cheek is stroked (rooting).  Increased sucking sounds, smacking lips, cooing, sighing, or squeaking.  Hand-to-mouth movements.  Increased sucking of fingers or hands. Late Signs of Hunger  Fussing.  Intermittent crying. Extreme Signs of Hunger Signs of extreme hunger will require calming and consoling before your baby will be able to breastfeed successfully. Do not wait for the following signs of extreme hunger to occur before you initiate breastfeeding:   Restlessness.  A loud, strong cry.   Screaming. BREASTFEEDING BASICS Breastfeeding Initiation  Find a comfortable place to sit or lie down, with your neck and back well supported.  Place a pillow or rolled up blanket under your baby to bring him or her to the level of your breast (if you are seated). Nursing pillows are specially designed to help support your arms and your baby while you breastfeed.  Make sure that your baby's abdomen is facing your abdomen.   Gently massage your breast. With your fingertips, massage from your chest wall toward your nipple in a circular motion. This encourages milk flow. You may need to continue this action during the feeding if your milk flows slowly.  Support your breast with 4 fingers underneath and your thumb above your nipple. Make sure your fingers are well away from your nipple and your baby's mouth.   Stroke your baby's lips gently with your finger or nipple.   When your baby's mouth is open wide enough, quickly bring your baby to your   breast, placing your entire nipple and as much of the colored area around your nipple (areola) as possible into your baby's mouth.   More areola should be visible above your baby's upper lip than below the lower lip.   Your baby's tongue should be between his or her lower gum and your breast.   Ensure that your baby's mouth is correctly positioned  around your nipple (latched). Your baby's lips should create a seal on your breast and be turned out (everted).  It is common for your baby to suck about 2 3 minutes in order to start the flow of breast milk. Latching Teaching your baby how to latch on to your breast properly is very important. An improper latch can cause nipple pain and decreased milk supply for you and poor weight gain in your baby. Also, if your baby is not latched onto your nipple properly, he or she may swallow some air during feeding. This can make your baby fussy. Burping your baby when you switch breasts during the feeding can help to get rid of the air. However, teaching your baby to latch on properly is still the best way to prevent fussiness from swallowing air while breastfeeding. Signs that your baby has successfully latched on to your nipple:    Silent tugging or silent sucking, without causing you pain.   Swallowing heard between every 3 4 sucks.    Muscle movement above and in front of his or her ears while sucking.  Signs that your baby has not successfully latched on to nipple:   Sucking sounds or smacking sounds from your baby while breastfeeding.  Nipple pain. If you think your baby has not latched on correctly, slip your finger into the corner of your baby's mouth to break the suction and place it between your baby's gums. Attempt breastfeeding initiation again. Signs of Successful Breastfeeding Signs from your baby:   A gradual decrease in the number of sucks or complete cessation of sucking.   Falling asleep.   Relaxation of his or her body.   Retention of a small amount of milk in his or her mouth.   Letting go of your breast by himself or herself. Signs from you:  Breasts that have increased in firmness, weight, and size 1 3 hours after feeding.   Breasts that are softer immediately after breastfeeding.  Increased milk volume, as well as a change in milk consistency and color by  the 5th day of breastfeeding.   Nipples that are not sore, cracked, or bleeding. Signs That Your Baby is Getting Enough Milk  Wetting at least 3 diapers in a 24-hour period. The urine should be clear and pale yellow by age 5 days.  At least 3 stools in a 24-hour period by age 5 days. The stool should be soft and yellow.  At least 3 stools in a 24-hour period by age 7 days. The stool should be seedy and yellow.  No loss of weight greater than 10% of birth weight during the first 3 days of age.  Average weight gain of 4 7 ounces (120 210 mL) per week after age 4 days.  Consistent daily weight gain by age 5 days, without weight loss after the age of 2 weeks. After a feeding, your baby may spit up a small amount. This is common. BREASTFEEDING FREQUENCY AND DURATION Frequent feeding will help you make more milk and can prevent sore nipples and breast engorgement. Breastfeed when you feel the need to reduce   the fullness of your breasts or when your baby shows signs of hunger. This is called "breastfeeding on demand." Avoid introducing a pacifier to your baby while you are working to establish breastfeeding (the first 4 6 weeks after your baby is born). After this time you may choose to use a pacifier. Research has shown that pacifier use during the first year of a baby's life decreases the risk of sudden infant death syndrome (SIDS). Allow your baby to feed on each breast as long as he or she wants. Breastfeed until your baby is finished feeding. When your baby unlatches or falls asleep while feeding from the first breast, offer the second breast. Because newborns are often sleepy in the first few weeks of life, you may need to awaken your baby to get him or her to feed. Breastfeeding times will vary from baby to baby. However, the following rules can serve as a guide to help you ensure that your baby is properly fed:  Newborns (babies 4 weeks of age or younger) may breastfeed every 1 3  hours.  Newborns should not go longer than 3 hours during the day or 5 hours during the night without breastfeeding.  You should breastfeed your baby a minimum of 8 times in a 24-hour period until you begin to introduce solid foods to your baby at around 6 months of age. BREAST MILK PUMPING Pumping and storing breast milk allows you to ensure that your baby is exclusively fed your breast milk, even at times when you are unable to breastfeed. This is especially important if you are going back to work while you are still breastfeeding or when you are not able to be present during feedings. Your lactation consultant can give you guidelines on how long it is safe to store breast milk.  A breast pump is a machine that allows you to pump milk from your breast into a sterile bottle. The pumped breast milk can then be stored in a refrigerator or freezer. Some breast pumps are operated by hand, while others use electricity. Ask your lactation consultant which type will work best for you. Breast pumps can be purchased, but some hospitals and breastfeeding support groups lease breast pumps on a monthly basis. A lactation consultant can teach you how to hand express breast milk, if you prefer not to use a pump.  CARING FOR YOUR BREASTS WHILE YOU BREASTFEED Nipples can become dry, cracked, and sore while breastfeeding. The following recommendations can help keep your breasts moisturized and healthy:  Avoid using soap on your nipples.   Wear a supportive bra. Although not required, special nursing bras and tank tops are designed to allow access to your breasts for breastfeeding without taking off your entire bra or top. Avoid wearing underwire style bras or extremely tight bras.  Air dry your nipples for 3 4minutes after each feeding.   Use only cotton bra pads to absorb leaked breast milk. Leaking of breast milk between feedings is normal.   Use lanolin on your nipples after breastfeeding. Lanolin helps to  maintain your skin's normal moisture barrier. If you use pure lanolin you do not need to wash it off before feeding your baby again. Pure lanolin is not toxic to your baby. You may also hand express a few drops of breast milk and gently massage that milk into your nipples and allow the milk to air dry. In the first few weeks after giving birth, some women experience extremely full breasts (engorgement). Engorgement can make   your breasts feel heavy, warm, and tender to the touch. Engorgement peaks within 3 5 days after you give birth. The following recommendations can help ease engorgement:  Completely empty your breasts while breastfeeding or pumping. You may want to start by applying warm, moist heat (in the shower or with warm water-soaked hand towels) just before feeding or pumping. This increases circulation and helps the milk flow. If your baby does not completely empty your breasts while breastfeeding, pump any extra milk after he or she is finished.  Wear a snug bra (nursing or regular) or tank top for 1 2 days to signal your body to slightly decrease milk production.  Apply ice packs to your breasts, unless this is too uncomfortable for you.  Make sure that your baby is latched on and positioned properly while breastfeeding. If engorgement persists after 48 hours of following these recommendations, contact your health care provider or a lactation consultant. OVERALL HEALTH CARE RECOMMENDATIONS WHILE BREASTFEEDING  Eat healthy foods. Alternate between meals and snacks, eating 3 of each per day. Because what you eat affects your breast milk, some of the foods may make your baby more irritable than usual. Avoid eating these foods if you are sure that they are negatively affecting your baby.  Drink milk, fruit juice, and water to satisfy your thirst (about 10 glasses a day).   Rest often, relax, and continue to take your prenatal vitamins to prevent fatigue, stress, and anemia.  Continue  breast self-awareness checks.  Avoid chewing and smoking tobacco.  Avoid alcohol and drug use. Some medicines that may be harmful to your baby can pass through breast milk. It is important to ask your health care provider before taking any medicine, including all over-the-counter and prescription medicine as well as vitamin and herbal supplements. It is possible to become pregnant while breastfeeding. If birth control is desired, ask your health care provider about options that will be safe for your baby. SEEK MEDICAL CARE IF:   You feel like you want to stop breastfeeding or have become frustrated with breastfeeding.  You have painful breasts or nipples.  Your nipples are cracked or bleeding.  Your breasts are red, tender, or warm.  You have a swollen area on either breast.  You have a fever or chills.  You have nausea or vomiting.  You have drainage other than breast milk from your nipples.  Your breasts do not become full before feedings by the 5th day after you give birth.  You feel sad and depressed.  Your baby is too sleepy to eat well.  Your baby is having trouble sleeping.   Your baby is wetting less than 3 diapers in a 24-hour period.  Your baby has less than 3 stools in a 24-hour period.  Your baby's skin or the white part of his or her eyes becomes yellow.   Your baby is not gaining weight by 5 days of age. SEEK IMMEDIATE MEDICAL CARE IF:   Your baby is overly tired (lethargic) and does not want to wake up and feed.  Your baby develops an unexplained fever. Document Released: 07/20/2005 Document Revised: 03/22/2013 Document Reviewed: 01/11/2013 [employer] Patient Information 2014 [employer].  Preterm Birth Preterm birth is a birth that happens before 37 weeks of pregnancy. Most pregnancies last about 39 41 weeks. Every week in the womb is important and is beneficial to the health of the infant. Infants born before 37 weeks of pregnancy are at a higher  risk for complications.   Depending on when the infant was born, he or she may be:  Late preterm. Born between 32 weeks and 37 weeks of pregnancy.  Very preterm. Born at less than 32 weeks of pregnancy.  Extremely preterm. Born at less than 25 weeks of pregnancy. The earlier a baby is born, the more likely the child will have issues related to prematurity. Complications and problems that can be seen in infants born too early include:  Problems breathing (respiratory distress syndrome).  Low birth weight.  Problems feeding.  Sleeping problems.  Yellowing of the skin (jaundice).  Infections such as pneumonia. Babies born very preterm or extremely preterm are at risk for more serious medical issues. These include:  More severe breathing issues.  Eyesight issues.  Brain development issues (intraventricular hemorrhage).  Behavioral and emotional development issues.  Growth and developmental delays.  Cerebral palsy.  Serious feeding or bowel complications (necrotizing enterocolitis). CAUSES  There are two broad categories of preterm birth.  Spontaneous preterm birth. This is a birth resulting from preterm labor (not medically induced) or preterm premature rupture of membranes (PPROM).  Indicated preterm birth. This is a birth resulting from labor being medically induced due to health, personal, or social reasons. RISK FACTORS Preterm birth may be related to certain medical conditions, lifestyle factors, or demographic factors encountered by the mother or fetus.  Medical conditions include:  Multiple gestations (twins, triplets, and so on).  Infection.  Diabetes.  Heart disease.  Kidney disease.  Cervical or uterine abnormalities.  Being underweight.  High blood pressure or preeclampsia.  Premature rupture of membranes (PROM).  Birth defects in the fetus.  Lifestyle factors include:  Poor prenatal care.  Poor nutrition or anemia.  Cigarette  smoking.  Consuming alcohol.  High levels of stress and lack of social or emotional support.  Exposure to chemical or environmental toxins.  Substance abuse.  Demographic factors include:  African-American ethnicity.  Age (younger than 18 or older than 27 years of age).  Low socioeconomic status. Women with a history of preterm labor or who become pregnant within 18 months of giving birth are also at increased risk for preterm birth. DIAGNOSIS  Your health care provider may request additional tests to diagnose underlying complications resulting from preterm birth. Tests on the infant may include:  Physical exam.  Blood tests.  Chest X-rays.  Heart-lung monitoring. TREATMENT  After birth, special care will be taken to assess any problems or complications for the infant. Supportive care will be provided for the infant. Treatment depends on what problems are present and any complications that develop. Some preterm infants are cared for in a neonatal intensive care unit. In general, care may include:  Maintaining temperature and oxygen in a clear heated box (baby isolette).  Monitoring the infant's heart rate, breathing, and level of oxygen in the blood.  Monitoring for signs of infection and, if needed, giving IV antibiotic medicine.  Inserting a feeding tube (nose, mouth) or giving IV nutrition if unable to feed.  Inserting a breathing tube (ventilation).  Respiration support (continuous positive airway pressure [CPAP] or oxygen). Treatment will change as the infant builds up strength and is able to breathe and eat on his or her own. For some infants, no special treatment is necessary. Parents may be educated on the potential health risks of prematurity to the infant. HOME CARE INSTRUCTIONS  Understand your infant's special conditions and needs. It may be reassuring to learn about infant CPR.  Monitor your infant in the car   seat until he or she grows and matures. Infant  car seats can cause breathing difficulties for preterm infants.  Keep your infant warm. Dress your infant in layers and keep him or her away from drafts, especially in cold months of the year.  Wash your hands thoroughly after going to the bathroom or changing a diaper. Late preterm infants may be more prone to infection.  Follow all your health care provider's instructions for providing support and care to your preterm infant.  Get support from organizations and groups that understand your challenges.  Follow up with your infant's health care provider as directed. Prevention There are some things you can do to help lower your risk of having a preterm infant in the future. These include:  Good prenatal care throughout the entire pregnancy. See a health care provider regularly for advice and tests.  Management of underlying medical conditions.  Proper self-care and lifestyle changes.  Proper diet and weight control.  Watching for signs of various infections. SEEK MEDICAL CARE IF:  Your infant has feeding difficulties.  Your infant has sleeping difficulties.  Your infant has breathing difficulties.  Your infant's skin starts to look yellow.  Your infant shows signs of infection, such as a stuffy nose, fever, crying, or bluish color of the skin. FOR MORE INFORMATION [employer]: www.marchofdimes.com Prematurity.org: www.prematurity.org Document Released: 10/10/2003 Document Revised: 05/10/2013 Document Reviewed: 02/16/2013 [employer] Patient Information 2014 [employer].  

## 2013-11-07 NOTE — Progress Notes (Signed)
P = 94 

## 2013-11-08 ENCOUNTER — Ambulatory Visit (INDEPENDENT_AMBULATORY_CARE_PROVIDER_SITE_OTHER): Payer: Managed Care, Other (non HMO) | Admitting: *Deleted

## 2013-11-08 DIAGNOSIS — O09219 Supervision of pregnancy with history of pre-term labor, unspecified trimester: Secondary | ICD-10-CM

## 2013-11-08 DIAGNOSIS — Z3480 Encounter for supervision of other normal pregnancy, unspecified trimester: Secondary | ICD-10-CM

## 2013-11-08 MED ORDER — BETAMETHASONE SOD PHOS & ACET 6 (3-3) MG/ML IJ SUSP
12.0000 mg | Freq: Once | INTRAMUSCULAR | Status: AC
  Administered 2013-11-08: 12 mg via INTRAMUSCULAR

## 2013-11-21 ENCOUNTER — Encounter: Payer: Self-pay | Admitting: Family Medicine

## 2013-11-21 ENCOUNTER — Ambulatory Visit (INDEPENDENT_AMBULATORY_CARE_PROVIDER_SITE_OTHER): Payer: Managed Care, Other (non HMO) | Admitting: Family Medicine

## 2013-11-21 VITALS — BP 112/76 | HR 102 | Wt 188.0 lb

## 2013-11-21 DIAGNOSIS — O09219 Supervision of pregnancy with history of pre-term labor, unspecified trimester: Secondary | ICD-10-CM

## 2013-11-21 DIAGNOSIS — Z3480 Encounter for supervision of other normal pregnancy, unspecified trimester: Secondary | ICD-10-CM

## 2013-11-21 NOTE — Patient Instructions (Signed)
Breastfeeding Deciding to breastfeed is one of the best choices you can make for you and your baby. A change in hormones during pregnancy causes your breast tissue to grow and increases the number and size of your milk ducts. These hormones also allow proteins, sugars, and fats from your blood supply to make breast milk in your milk-producing glands. Hormones prevent breast milk from being released before your baby is born as well as prompt milk flow after birth. Once breastfeeding has begun, thoughts of your baby, as well as his or her sucking or crying, can stimulate the release of milk from your milk-producing glands.  BENEFITS OF BREASTFEEDING For Your Baby  Your first milk (colostrum) helps your baby's digestive system function better.   There are antibodies in your milk that help your baby fight off infections.   Your baby has a lower incidence of asthma, allergies, and sudden infant death syndrome.   The nutrients in breast milk are better for your baby than infant formulas and are designed uniquely for your baby's needs.   Breast milk improves your baby's brain development.   Your baby is less likely to develop other conditions, such as childhood obesity, asthma, or type 2 diabetes mellitus.  For You   Breastfeeding helps to create a very special bond between you and your baby.   Breastfeeding is convenient. Breast milk is always available at the correct temperature and costs nothing.   Breastfeeding helps to burn calories and helps you lose the weight gained during pregnancy.   Breastfeeding makes your uterus contract to its prepregnancy size faster and slows bleeding (lochia) after you give birth.   Breastfeeding helps to lower your risk of developing type 2 diabetes mellitus, osteoporosis, and breast or ovarian cancer later in life. SIGNS THAT YOUR BABY IS HUNGRY Early Signs of Hunger  Increased alertness or activity.  Stretching.  Movement of the head from  side to side.  Movement of the head and opening of the mouth when the corner of the mouth or cheek is stroked (rooting).  Increased sucking sounds, smacking lips, cooing, sighing, or squeaking.  Hand-to-mouth movements.  Increased sucking of fingers or hands. Late Signs of Hunger  Fussing.  Intermittent crying. Extreme Signs of Hunger Signs of extreme hunger will require calming and consoling before your baby will be able to breastfeed successfully. Do not wait for the following signs of extreme hunger to occur before you initiate breastfeeding:   Restlessness.  A loud, strong cry.   Screaming. BREASTFEEDING BASICS Breastfeeding Initiation  Find a comfortable place to sit or lie down, with your neck and back well supported.  Place a pillow or rolled up blanket under your baby to bring him or her to the level of your breast (if you are seated). Nursing pillows are specially designed to help support your arms and your baby while you breastfeed.  Make sure that your baby's abdomen is facing your abdomen.   Gently massage your breast. With your fingertips, massage from your chest wall toward your nipple in a circular motion. This encourages milk flow. You may need to continue this action during the feeding if your milk flows slowly.  Support your breast with 4 fingers underneath and your thumb above your nipple. Make sure your fingers are well away from your nipple and your baby's mouth.   Stroke your baby's lips gently with your finger or nipple.   When your baby's mouth is open wide enough, quickly bring your baby to your   breast, placing your entire nipple and as much of the colored area around your nipple (areola) as possible into your baby's mouth.   More areola should be visible above your baby's upper lip than below the lower lip.   Your baby's tongue should be between his or her lower gum and your breast.   Ensure that your baby's mouth is correctly positioned  around your nipple (latched). Your baby's lips should create a seal on your breast and be turned out (everted).  It is common for your baby to suck about 2 3 minutes in order to start the flow of breast milk. Latching Teaching your baby how to latch on to your breast properly is very important. An improper latch can cause nipple pain and decreased milk supply for you and poor weight gain in your baby. Also, if your baby is not latched onto your nipple properly, he or she may swallow some air during feeding. This can make your baby fussy. Burping your baby when you switch breasts during the feeding can help to get rid of the air. However, teaching your baby to latch on properly is still the best way to prevent fussiness from swallowing air while breastfeeding. Signs that your baby has successfully latched on to your nipple:    Silent tugging or silent sucking, without causing you pain.   Swallowing heard between every 3 4 sucks.    Muscle movement above and in front of his or her ears while sucking.  Signs that your baby has not successfully latched on to nipple:   Sucking sounds or smacking sounds from your baby while breastfeeding.  Nipple pain. If you think your baby has not latched on correctly, slip your finger into the corner of your baby's mouth to break the suction and place it between your baby's gums. Attempt breastfeeding initiation again. Signs of Successful Breastfeeding Signs from your baby:   A gradual decrease in the number of sucks or complete cessation of sucking.   Falling asleep.   Relaxation of his or her body.   Retention of a small amount of milk in his or her mouth.   Letting go of your breast by himself or herself. Signs from you:  Breasts that have increased in firmness, weight, and size 1 3 hours after feeding.   Breasts that are softer immediately after breastfeeding.  Increased milk volume, as well as a change in milk consistency and color by  the 5th day of breastfeeding.   Nipples that are not sore, cracked, or bleeding. Signs That Your Baby is Getting Enough Milk  Wetting at least 3 diapers in a 24-hour period. The urine should be clear and pale yellow by age 5 days.  At least 3 stools in a 24-hour period by age 5 days. The stool should be soft and yellow.  At least 3 stools in a 24-hour period by age 7 days. The stool should be seedy and yellow.  No loss of weight greater than 10% of birth weight during the first 3 days of age.  Average weight gain of 4 7 ounces (120 210 mL) per week after age 4 days.  Consistent daily weight gain by age 5 days, without weight loss after the age of 2 weeks. After a feeding, your baby may spit up a small amount. This is common. BREASTFEEDING FREQUENCY AND DURATION Frequent feeding will help you make more milk and can prevent sore nipples and breast engorgement. Breastfeed when you feel the need to reduce   the fullness of your breasts or when your baby shows signs of hunger. This is called "breastfeeding on demand." Avoid introducing a pacifier to your baby while you are working to establish breastfeeding (the first 4 6 weeks after your baby is born). After this time you may choose to use a pacifier. Research has shown that pacifier use during the first year of a baby's life decreases the risk of sudden infant death syndrome (SIDS). Allow your baby to feed on each breast as long as he or she wants. Breastfeed until your baby is finished feeding. When your baby unlatches or falls asleep while feeding from the first breast, offer the second breast. Because newborns are often sleepy in the first few weeks of life, you may need to awaken your baby to get him or her to feed. Breastfeeding times will vary from baby to baby. However, the following rules can serve as a guide to help you ensure that your baby is properly fed:  Newborns (babies 4 weeks of age or younger) may breastfeed every 1 3  hours.  Newborns should not go longer than 3 hours during the day or 5 hours during the night without breastfeeding.  You should breastfeed your baby a minimum of 8 times in a 24-hour period until you begin to introduce solid foods to your baby at around 6 months of age. BREAST MILK PUMPING Pumping and storing breast milk allows you to ensure that your baby is exclusively fed your breast milk, even at times when you are unable to breastfeed. This is especially important if you are going back to work while you are still breastfeeding or when you are not able to be present during feedings. Your lactation consultant can give you guidelines on how long it is safe to store breast milk.  A breast pump is a machine that allows you to pump milk from your breast into a sterile bottle. The pumped breast milk can then be stored in a refrigerator or freezer. Some breast pumps are operated by hand, while others use electricity. Ask your lactation consultant which type will work best for you. Breast pumps can be purchased, but some hospitals and breastfeeding support groups lease breast pumps on a monthly basis. A lactation consultant can teach you how to hand express breast milk, if you prefer not to use a pump.  CARING FOR YOUR BREASTS WHILE YOU BREASTFEED Nipples can become dry, cracked, and sore while breastfeeding. The following recommendations can help keep your breasts moisturized and healthy:  Avoid using soap on your nipples.   Wear a supportive bra. Although not required, special nursing bras and tank tops are designed to allow access to your breasts for breastfeeding without taking off your entire bra or top. Avoid wearing underwire style bras or extremely tight bras.  Air dry your nipples for 3 4minutes after each feeding.   Use only cotton bra pads to absorb leaked breast milk. Leaking of breast milk between feedings is normal.   Use lanolin on your nipples after breastfeeding. Lanolin helps to  maintain your skin's normal moisture barrier. If you use pure lanolin you do not need to wash it off before feeding your baby again. Pure lanolin is not toxic to your baby. You may also hand express a few drops of breast milk and gently massage that milk into your nipples and allow the milk to air dry. In the first few weeks after giving birth, some women experience extremely full breasts (engorgement). Engorgement can make   your breasts feel heavy, warm, and tender to the touch. Engorgement peaks within 3 5 days after you give birth. The following recommendations can help ease engorgement:  Completely empty your breasts while breastfeeding or pumping. You may want to start by applying warm, moist heat (in the shower or with warm water-soaked hand towels) just before feeding or pumping. This increases circulation and helps the milk flow. If your baby does not completely empty your breasts while breastfeeding, pump any extra milk after he or she is finished.  Wear a snug bra (nursing or regular) or tank top for 1 2 days to signal your body to slightly decrease milk production.  Apply ice packs to your breasts, unless this is too uncomfortable for you.  Make sure that your baby is latched on and positioned properly while breastfeeding. If engorgement persists after 48 hours of following these recommendations, contact your health care provider or a lactation consultant. OVERALL HEALTH CARE RECOMMENDATIONS WHILE BREASTFEEDING  Eat healthy foods. Alternate between meals and snacks, eating 3 of each per day. Because what you eat affects your breast milk, some of the foods may make your baby more irritable than usual. Avoid eating these foods if you are sure that they are negatively affecting your baby.  Drink milk, fruit juice, and water to satisfy your thirst (about 10 glasses a day).   Rest often, relax, and continue to take your prenatal vitamins to prevent fatigue, stress, and anemia.  Continue  breast self-awareness checks.  Avoid chewing and smoking tobacco.  Avoid alcohol and drug use. Some medicines that may be harmful to your baby can pass through breast milk. It is important to ask your health care provider before taking any medicine, including all over-the-counter and prescription medicine as well as vitamin and herbal supplements. It is possible to become pregnant while breastfeeding. If birth control is desired, ask your health care provider about options that will be safe for your baby. SEEK MEDICAL CARE IF:   You feel like you want to stop breastfeeding or have become frustrated with breastfeeding.  You have painful breasts or nipples.  Your nipples are cracked or bleeding.  Your breasts are red, tender, or warm.  You have a swollen area on either breast.  You have a fever or chills.  You have nausea or vomiting.  You have drainage other than breast milk from your nipples.  Your breasts do not become full before feedings by the 5th day after you give birth.  You feel sad and depressed.  Your baby is too sleepy to eat well.  Your baby is having trouble sleeping.   Your baby is wetting less than 3 diapers in a 24-hour period.  Your baby has less than 3 stools in a 24-hour period.  Your baby's skin or the white part of his or her eyes becomes yellow.   Your baby is not gaining weight by 5 days of age. SEEK IMMEDIATE MEDICAL CARE IF:   Your baby is overly tired (lethargic) and does not want to wake up and feed.  Your baby develops an unexplained fever. Document Released: 07/20/2005 Document Revised: 03/22/2013 Document Reviewed: 01/11/2013 [employer] Patient Information 2014 [employer].  

## 2013-11-21 NOTE — Progress Notes (Signed)
Feels some better Still on bedrest Minimal cervical change--preterm labor precautions

## 2013-11-27 ENCOUNTER — Other Ambulatory Visit (INDEPENDENT_AMBULATORY_CARE_PROVIDER_SITE_OTHER): Payer: Managed Care, Other (non HMO) | Admitting: *Deleted

## 2013-11-27 DIAGNOSIS — B379 Candidiasis, unspecified: Secondary | ICD-10-CM

## 2013-11-27 MED ORDER — FLUCONAZOLE 150 MG PO TABS: 150.0000 mg | ORAL_TABLET | Freq: Once | ORAL | Status: DC

## 2013-11-27 NOTE — Progress Notes (Signed)
Patient is having increased discharge that started approximately one hour ago.  Upon exam the discharge is thick curdy and white.  The amniosure swab did not change color.  Patient is reassured.  We will call in Diflucan for her and she will follow up with the physician at her regular scheduled appointment on May 5.  She will call us back if any of her symptoms change.

## 2013-12-05 ENCOUNTER — Encounter: Payer: Self-pay | Admitting: Family Medicine

## 2013-12-05 ENCOUNTER — Ambulatory Visit (INDEPENDENT_AMBULATORY_CARE_PROVIDER_SITE_OTHER): Payer: Managed Care, Other (non HMO) | Admitting: Family Medicine

## 2013-12-05 VITALS — BP 112/69 | HR 89 | Wt 193.8 lb

## 2013-12-05 DIAGNOSIS — Z3480 Encounter for supervision of other normal pregnancy, unspecified trimester: Secondary | ICD-10-CM

## 2013-12-05 NOTE — Patient Instructions (Addendum)
Breastfeeding Deciding to breastfeed is one of the best choices you can make for you and your baby. A change in hormones during pregnancy causes your breast tissue to grow and increases the number and size of your milk ducts. These hormones also allow proteins, sugars, and fats from your blood supply to make breast milk in your milk-producing glands. Hormones prevent breast milk from being released before your baby is born as well as prompt milk flow after birth. Once breastfeeding has begun, thoughts of your baby, as well as his or her sucking or crying, can stimulate the release of milk from your milk-producing glands.  BENEFITS OF BREASTFEEDING For Your Baby  Your first milk (colostrum) helps your baby's digestive system function better.   There are antibodies in your milk that help your baby fight off infections.   Your baby has a lower incidence of asthma, allergies, and sudden infant death syndrome.   The nutrients in breast milk are better for your baby than infant formulas and are designed uniquely for your baby's needs.   Breast milk improves your baby's brain development.   Your baby is less likely to develop other conditions, such as childhood obesity, asthma, or type 2 diabetes mellitus.  For You   Breastfeeding helps to create a very special bond between you and your baby.   Breastfeeding is convenient. Breast milk is always available at the correct temperature and costs nothing.   Breastfeeding helps to burn calories and helps you lose the weight gained during pregnancy.   Breastfeeding makes your uterus contract to its prepregnancy size faster and slows bleeding (lochia) after you give birth.   Breastfeeding helps to lower your risk of developing type 2 diabetes mellitus, osteoporosis, and breast or ovarian cancer later in life. SIGNS THAT YOUR BABY IS HUNGRY Early Signs of Hunger  Increased alertness or activity.  Stretching.  Movement of the head from  side to side.  Movement of the head and opening of the mouth when the corner of the mouth or cheek is stroked (rooting).  Increased sucking sounds, smacking lips, cooing, sighing, or squeaking.  Hand-to-mouth movements.  Increased sucking of fingers or hands. Late Signs of Hunger  Fussing.  Intermittent crying. Extreme Signs of Hunger Signs of extreme hunger will require calming and consoling before your baby will be able to breastfeed successfully. Do not wait for the following signs of extreme hunger to occur before you initiate breastfeeding:   Restlessness.  A loud, strong cry.   Screaming. BREASTFEEDING BASICS Breastfeeding Initiation  Find a comfortable place to sit or lie down, with your neck and back well supported.  Place a pillow or rolled up blanket under your baby to bring him or her to the level of your breast (if you are seated). Nursing pillows are specially designed to help support your arms and your baby while you breastfeed.  Make sure that your baby's abdomen is facing your abdomen.   Gently massage your breast. With your fingertips, massage from your chest wall toward your nipple in a circular motion. This encourages milk flow. You may need to continue this action during the feeding if your milk flows slowly.  Support your breast with 4 fingers underneath and your thumb above your nipple. Make sure your fingers are well away from your nipple and your baby's mouth.   Stroke your baby's lips gently with your finger or nipple.   When your baby's mouth is open wide enough, quickly bring your baby to your   breast, placing your entire nipple and as much of the colored area around your nipple (areola) as possible into your baby's mouth.   More areola should be visible above your baby's upper lip than below the lower lip.   Your baby's tongue should be between his or her lower gum and your breast.   Ensure that your baby's mouth is correctly positioned  around your nipple (latched). Your baby's lips should create a seal on your breast and be turned out (everted).  It is common for your baby to suck about 2 3 minutes in order to start the flow of breast milk. Latching Teaching your baby how to latch on to your breast properly is very important. An improper latch can cause nipple pain and decreased milk supply for you and poor weight gain in your baby. Also, if your baby is not latched onto your nipple properly, he or she may swallow some air during feeding. This can make your baby fussy. Burping your baby when you switch breasts during the feeding can help to get rid of the air. However, teaching your baby to latch on properly is still the best way to prevent fussiness from swallowing air while breastfeeding. Signs that your baby has successfully latched on to your nipple:    Silent tugging or silent sucking, without causing you pain.   Swallowing heard between every 3 4 sucks.    Muscle movement above and in front of his or her ears while sucking.  Signs that your baby has not successfully latched on to nipple:   Sucking sounds or smacking sounds from your baby while breastfeeding.  Nipple pain. If you think your baby has not latched on correctly, slip your finger into the corner of your baby's mouth to break the suction and place it between your baby's gums. Attempt breastfeeding initiation again. Signs of Successful Breastfeeding Signs from your baby:   A gradual decrease in the number of sucks or complete cessation of sucking.   Falling asleep.   Relaxation of his or her body.   Retention of a small amount of milk in his or her mouth.   Letting go of your breast by himself or herself. Signs from you:  Breasts that have increased in firmness, weight, and size 1 3 hours after feeding.   Breasts that are softer immediately after breastfeeding.  Increased milk volume, as well as a change in milk consistency and color by  the 5th day of breastfeeding.   Nipples that are not sore, cracked, or bleeding. Signs That Your Randel Books is Getting Enough Milk  Wetting at least 3 diapers in a 24-hour period. The urine should be clear and pale yellow by age 64411 days.  At least 3 stools in a 24-hour period by age 64411 days. The stool should be soft and yellow.  At least 3 stools in a 24-hour period by age 644 days. The stool should be seedy and yellow.  No loss of weight greater than 10% of birth weight during the first 22 days of age.  Average weight gain of 4 7 ounces (120 210 mL) per week after age 64 days.  Consistent daily weight gain by age 60 days, without weight loss after the age of 2 weeks. After a feeding, your baby may spit up a small amount. This is common. BREASTFEEDING FREQUENCY AND DURATION Frequent feeding will help you make more milk and can prevent sore nipples and breast engorgement. Breastfeed when you feel the need to reduce  the fullness of your breasts or when your baby shows signs of hunger. This is called "breastfeeding on demand." Avoid introducing a pacifier to your baby while you are working to establish breastfeeding (the first 4 6 weeks after your baby is born). After this time you may choose to use a pacifier. Research has shown that pacifier use during the first year of a baby's life decreases the risk of sudden infant death syndrome (SIDS). Allow your baby to feed on each breast as long as he or she wants. Breastfeed until your baby is finished feeding. When your baby unlatches or falls asleep while feeding from the first breast, offer the second breast. Because newborns are often sleepy in the first few weeks of life, you may need to awaken your baby to get him or her to feed. Breastfeeding times will vary from baby to baby. However, the following rules can serve as a guide to help you ensure that your baby is properly fed:  Newborns (babies 4 weeks of age or younger) may breastfeed every 1 3  hours.  Newborns should not go longer than 3 hours during the day or 5 hours during the night without breastfeeding.  You should breastfeed your baby a minimum of 8 times in a 24-hour period until you begin to introduce solid foods to your baby at around 6 months of age. BREAST MILK PUMPING Pumping and storing breast milk allows you to ensure that your baby is exclusively fed your breast milk, even at times when you are unable to breastfeed. This is especially important if you are going back to work while you are still breastfeeding or when you are not able to be present during feedings. Your lactation consultant can give you guidelines on how long it is safe to store breast milk.  A breast pump is a machine that allows you to pump milk from your breast into a sterile bottle. The pumped breast milk can then be stored in a refrigerator or freezer. Some breast pumps are operated by hand, while others use electricity. Ask your lactation consultant which type will work best for you. Breast pumps can be purchased, but some hospitals and breastfeeding support groups lease breast pumps on a monthly basis. A lactation consultant can teach you how to hand express breast milk, if you prefer not to use a pump.  CARING FOR YOUR BREASTS WHILE YOU BREASTFEED Nipples can become dry, cracked, and sore while breastfeeding. The following recommendations can help keep your breasts moisturized and healthy:  Avoid using soap on your nipples.   Wear a supportive bra. Although not required, special nursing bras and tank tops are designed to allow access to your breasts for breastfeeding without taking off your entire bra or top. Avoid wearing underwire style bras or extremely tight bras.  Air dry your nipples for 3 4minutes after each feeding.   Use only cotton bra pads to absorb leaked breast milk. Leaking of breast milk between feedings is normal.   Use lanolin on your nipples after breastfeeding. Lanolin helps to  maintain your skin's normal moisture barrier. If you use pure lanolin you do not need to wash it off before feeding your baby again. Pure lanolin is not toxic to your baby. You may also hand express a few drops of breast milk and gently massage that milk into your nipples and allow the milk to air dry. In the first few weeks after giving birth, some women experience extremely full breasts (engorgement). Engorgement can make   your breasts feel heavy, warm, and tender to the touch. Engorgement peaks within 3 5 days after you give birth. The following recommendations can help ease engorgement:  Completely empty your breasts while breastfeeding or pumping. You may want to start by applying warm, moist heat (in the shower or with warm water-soaked hand towels) just before feeding or pumping. This increases circulation and helps the milk flow. If your baby does not completely empty your breasts while breastfeeding, pump any extra milk after he or she is finished.  Wear a snug bra (nursing or regular) or tank top for 1 2 days to signal your body to slightly decrease milk production.  Apply ice packs to your breasts, unless this is too uncomfortable for you.  Make sure that your baby is latched on and positioned properly while breastfeeding. If engorgement persists after 48 hours of following these recommendations, contact your health care provider or a Advertising copywriterlactation consultant. OVERALL HEALTH CARE RECOMMENDATIONS WHILE BREASTFEEDING  Eat healthy foods. Alternate between meals and snacks, eating 3 of each per day. Because what you eat affects your breast milk, some of the foods may make your baby more irritable than usual. Avoid eating these foods if you are sure that they are negatively affecting your baby.  Drink milk, fruit juice, and water to satisfy your thirst (about 10 glasses a day).   Rest often, relax, and continue to take your prenatal vitamins to prevent fatigue, stress, and anemia.  Continue  breast self-awareness checks.  Avoid chewing and smoking tobacco.  Avoid alcohol and drug use. Some medicines that may be harmful to your baby can pass through breast milk. It is important to ask your health care provider before taking any medicine, including all over-the-counter and prescription medicine as well as vitamin and herbal supplements. It is possible to become pregnant while breastfeeding. If birth control is desired, ask your health care provider about options that will be safe for your baby. SEEK MEDICAL CARE IF:   You feel like you want to stop breastfeeding or have become frustrated with breastfeeding.  You have painful breasts or nipples.  Your nipples are cracked or bleeding.  Your breasts are red, tender, or warm.  You have a swollen area on either breast.  You have a fever or chills.  You have nausea or vomiting.  You have drainage other than breast milk from your nipples.  Your breasts do not become full before feedings by the 5th day after you give birth.  You feel sad and depressed.  Your baby is too sleepy to eat well.  Your baby is having trouble sleeping.   Your baby is wetting less than 3 diapers in a 24-hour period.  Your baby has less than 3 stools in a 24-hour period.  Your baby's skin or the white part of his or her eyes becomes yellow.   Your baby is not gaining weight by 115 days of age. SEEK IMMEDIATE MEDICAL CARE IF:   Your baby is overly tired (lethargic) and does not want to wake up and feed.  Your baby develops an unexplained fever. Document Released: 07/20/2005 Document Revised: 03/22/2013 Document Reviewed: 01/11/2013 [facility] Patient Information 2014 [place]. Preterm Labor Information Preterm labor is when labor starts at less than 37 weeks of pregnancy. The normal length of a pregnancy is 39 to 41 weeks. CAUSES Often, there is no identifiable underlying cause as to why a woman goes into preterm labor. One of the most  common known causes of  preterm labor is infection. Infections of the uterus, cervix, vagina, amniotic sac, bladder, kidney, or even the lungs (pneumonia) can cause labor to start. Other suspected causes of preterm labor include:   Urogenital infections, such as yeast infections and bacterial vaginosis.   Uterine abnormalities (uterine shape, uterine septum, fibroids, or bleeding from the placenta).   A cervix that has been operated on (it may fail to stay closed).   Malformations in the fetus.   Multiple gestations (twins, triplets, and so on).   Breakage of the amniotic sac.  RISK FACTORS  Having a previous history of preterm labor.   Having premature rupture of membranes (PROM).   Having a placenta that covers the opening of the cervix (placenta previa).   Having a placenta that separates from the uterus (placental abruption).   Having a cervix that is too weak to hold the fetus in the uterus (incompetent cervix).   Having too much fluid in the amniotic sac (polyhydramnios).   Taking illegal drugs or smoking while pregnant.   Not gaining enough weight while pregnant.   Being younger than 7 and older than 27 years old.   Having a low socioeconomic status.   Being African American. SYMPTOMS Signs and symptoms of preterm labor include:   Menstrual-like cramps, abdominal pain, or back pain.  Uterine contractions that are regular, as frequent as six in an hour, regardless of their intensity (may be mild or painful).  Contractions that start on the top of the uterus and spread down to the lower abdomen and back.   A sense of increased pelvic pressure.   A watery or bloody mucus discharge that comes from the vagina.  TREATMENT Depending on the length of the pregnancy and other circumstances, your health care provider may suggest bed rest. If necessary, there are medicines that can be given to stop contractions and to mature the fetal lungs. If labor  happens before 34 weeks of pregnancy, a prolonged hospital stay may be recommended. Treatment depends on the condition of both you and the fetus.  WHAT SHOULD YOU DO IF YOU THINK YOU ARE IN PRETERM LABOR? Call your health care provider right away. You will need to go to the hospital to get checked immediately. HOW CAN YOU PREVENT PRETERM LABOR IN FUTURE PREGNANCIES? You should:   Stop smoking if you smoke.  Maintain healthy weight gain and avoid chemicals and drugs that are not necessary.  Be watchful for any type of infection.  Inform your health care provider if you have a known history of preterm labor. Document Released: 10/10/2003 Document Revised: 03/22/2013 Document Reviewed: 08/22/2012 [facility] Patient Information 2014 [place], [place].

## 2013-12-05 NOTE — Progress Notes (Signed)
Slow progressing preterm labor Labor precautions.

## 2013-12-05 NOTE — Progress Notes (Signed)
Feeling some increase in BH contractions, not painful or regular in pattern but more noticeable.

## 2013-12-08 ENCOUNTER — Inpatient Hospital Stay (HOSPITAL_COMMUNITY)
Admission: AD | Admit: 2013-12-08 | Discharge: 2013-12-08 | Disposition: A | Discharge status: Home / Self Care | Payer: Managed Care, Other (non HMO) | Source: Ambulatory Visit | Attending: Obstetrics and Gynecology | Admitting: Obstetrics and Gynecology

## 2013-12-08 ENCOUNTER — Encounter (HOSPITAL_COMMUNITY): Payer: Self-pay | Admitting: *Deleted

## 2013-12-08 DIAGNOSIS — O09899 Supervision of other high risk pregnancies, unspecified trimester: Secondary | ICD-10-CM

## 2013-12-08 DIAGNOSIS — O4700 False labor before 37 completed weeks of gestation, unspecified trimester: Secondary | ICD-10-CM | POA: Insufficient documentation

## 2013-12-08 DIAGNOSIS — O368190 Decreased fetal movements, unspecified trimester, not applicable or unspecified: Secondary | ICD-10-CM | POA: Insufficient documentation

## 2013-12-08 DIAGNOSIS — Z8249 Family history of ischemic heart disease and other diseases of the circulatory system: Secondary | ICD-10-CM | POA: Insufficient documentation

## 2013-12-08 DIAGNOSIS — R109 Unspecified abdominal pain: Secondary | ICD-10-CM | POA: Insufficient documentation

## 2013-12-08 DIAGNOSIS — Z3480 Encounter for supervision of other normal pregnancy, unspecified trimester: Secondary | ICD-10-CM

## 2013-12-08 DIAGNOSIS — O09219 Supervision of pregnancy with history of pre-term labor, unspecified trimester: Secondary | ICD-10-CM

## 2013-12-08 DIAGNOSIS — O26899 Other specified pregnancy related conditions, unspecified trimester: Secondary | ICD-10-CM

## 2013-12-08 DIAGNOSIS — O9989 Other specified diseases and conditions complicating pregnancy, childbirth and the puerperium: Secondary | ICD-10-CM

## 2013-12-08 DIAGNOSIS — Z283 Underimmunization status: Secondary | ICD-10-CM

## 2013-12-08 LAB — URINALYSIS, ROUTINE W REFLEX MICROSCOPIC
Bilirubin Urine: NEGATIVE
Glucose, UA: NEGATIVE mg/dL
Hgb urine dipstick: NEGATIVE
Ketones, ur: NEGATIVE mg/dL
LEUKOCYTES UA: NEGATIVE
Nitrite: NEGATIVE
PROTEIN: NEGATIVE mg/dL
Specific Gravity, Urine: 1.015 (ref 1.005–1.030)
Urobilinogen, UA: 0.2 mg/dL (ref 0.0–1.0)
pH: 6.5 (ref 5.0–8.0)

## 2013-12-08 NOTE — Discharge Instructions (Signed)
Abdominal Pain During Pregnancy Belly (abdominal) pain is common during pregnancy. Most of the time, it is not a serious problem. Other times, it can be a sign that something is wrong with the pregnancy. Always tell your doctor if you have belly pain. HOME CARE Monitor your belly pain for any changes. The following actions may help you feel better:  Do not have sex (intercourse) or put anything in your vagina until you feel better.  Rest until your pain stops.  Drink clear fluids if you feel sick to your stomach (nauseous). Do not eat solid food until you feel better.  Only take medicine as told by your doctor.  Keep all doctor visits as told. GET HELP RIGHT AWAY IF:   You are bleeding, leaking fluid, or pieces of tissue come out of your vagina.  You have more pain or cramping.  You keep throwing up (vomiting).  You have pain when you pee (urinate) or have blood in your pee.  You have a fever.  You do not feel your baby moving as much.  You feel very weak or feel like passing out.  You have trouble breathing, with or without belly pain.  You have a very bad headache and belly pain.  You have fluid leaking from your vagina and belly pain.  You keep having watery poop (diarrhea).  Your belly pain does not go away after resting, or the pain gets worse. MAKE SURE YOU:   Understand these instructions.  Will watch your condition.  Will get help right away if you are not doing well or get worse. Document Released: 07/08/2009 Document Revised: 03/22/2013 Document Reviewed: 02/16/2013 [facility] Patient Information 2014 [place], [place]. Braxton Hicks Contractions Pregnancy is commonly associated with contractions of the uterus throughout the pregnancy. Towards the end of pregnancy (32 to 34 weeks), these contractions [facility](Braxton Willa RoughHicks) can develop more often and may become more forceful. This is not true labor because these contractions do not result in opening (dilatation) and  thinning of the cervix. They are sometimes difficult to tell apart from true labor because these contractions can be forceful and people have different pain tolerances. You should not feel embarrassed if you go to the hospital with false labor. Sometimes, the only way to tell if you are in true labor is for your caregiver to follow the changes in the cervix. How to tell the difference between true and false labor: False labor. The contractions of false labor are usually shorter, irregular and not as hard as those of true labor. They are often felt in the front of the lower abdomen and in the groin. They may leave with walking around or changing positions while lying down. They get weaker and are shorter lasting as time goes on. These contractions are usually irregular. They do not usually become progressively stronger, regular and closer together as with true labor. True labor. Contractions in true labor last 30 to 70 seconds, become very regular, usually become more intense, and increase in frequency. They do not go away with walking. The discomfort is usually felt in the top of the uterus and spreads to the lower abdomen and low back. True labor can be determined by your caregiver with an exam. This will show that the cervix is dilating and getting thinner. If there are no prenatal problems or other health problems associated with the pregnancy, it is completely safe to be sent home with false labor and await the onset of true labor. HOME CARE INSTRUCTIONS  Keep up with your usual exercises and instructions. Take medications as directed. Keep your regular prenatal appointment. Eat and drink lightly if you think you are going into labor. If BH contractions are making you uncomfortable: Change your activity position from lying down or resting to walking/walking to resting. Sit and rest in a tub of warm water. Drink 2 to 3 glasses of water. Dehydration may cause B-H contractions. Do slow and  deep breathing several times an hour. SEEK IMMEDIATE MEDICAL CARE IF:  Your contractions continue to become stronger, more regular, and closer together. You have a gushing, burst or leaking of fluid from the vagina. An oral temperature above 102 F (38.9 C) develops. You have passage of blood-tinged mucus. You develop vaginal bleeding. You develop continuous belly (abdominal) pain. You have low back pain that you never had before. You feel the baby's head pushing down causing pelvic pressure. The baby is not moving as much as it used to. Document Released: 07/20/2005 Document Revised: 10/12/2011 Document Reviewed: 05/01/2013 [facility] Patient Information 2014 [place], [place].

## 2013-12-08 NOTE — MAU Note (Signed)
Pt reports sharp pain off/on on rt side.pt reports she has not felt the baby move in last 2 hours.

## 2013-12-08 NOTE — MAU Provider Note (Signed)
First Provider Initiated Contact with Patient 12/08/13 0225      Chief Complaint:  Decreased Fetal Movement and Abdominal Pain   Julia Levine is  28 y.o. [phone] at 6577w5d presents complaining of Decreased Fetal Movement and Abdominal Pain  Pt reports no fetal movement for 2 hours.  She also c/o 2 days of right sided abdominal pain from mid to RLQ.  Onset is usually associated with contractions.  At its worst 8/10. Currently she is not having pain.  She denies vaginal bleeding, no LOF, no change in vaginal discharge.   H/o yeast infection treated 11/27/13  Obstetrical/Gynecological History: OB History   Grav Para Term Preterm Abortions TAB SAB Ect Mult Living   4 2 1 1 1  0 1 0 1 1     G1 38 wks VAD G2 twin pregnancy, 24weeks G3 SAB G4: vaginal progesterone, most recent cervical length 2.5cm with funneling on 3/23  Past Medical History: Past Medical History  Diagnosis Date  . 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 Colposcopy  10/2011    abnormal pap HPV    Family History: Family History  Problem Relation Age of Onset  . Hypertension Mother     Social History: History  Substance Use Topics  . Smoking status: Never Smoker   . Smokeless tobacco: Never Used  . Alcohol Use: No    Allergies: No Known Allergies  Meds:  Prescriptions prior to admission  Medication Sig Dispense Refill  . Prenatal Vit-Fe Fumarate-FA (PRENATAL MULTIVITAMIN) TABS tablet Take 1 tablet by mouth daily at 12 noon.      . progesterone (PROMETRIUM) 200 MG capsule Place one capsule vaginally at bedtime  30 capsule  10    Review of Systems -   Review of Systems  Constitutional: No fever or chills.  HEENT: No headache or vision changes CV: No chest pain Resp: no dyspnea Abdomen: pain as described above. 2 soft stools today Ext: intermittent pedal edema    Physical Exam  Blood pressure 119/58, pulse 83,  temperature 98.2 F (36.8 C), temperature source Oral, resp. rate 18, height 5\' 7"  (1.702 m), weight 89.359 kg (197 lb), last menstrual period 04/09/2013, SpO2 98.00%. GENERAL: Well-developed, well-nourished female in no acute distress.  LUNGS: Clear to auscultation bilaterally.  HEART: Regular rate and rhythm. ABDOMEN: Soft, nontender, nondistended, gravid.  EXTREMITIES: Nontender, no edema, 2+ distal pulses. CERVICAL EXAM: 1cm/60%/post Presentation: cephalic FHT:  Baseline 130s, + accels, moderate variability, no decels. Reactive NST Contractions: none   Labs: No results found for this or any previous visit (from the past 24 hour(s)). Imaging Studies:  No results found.  Assessment: Julia Levine is  28 y.o. [phone] at 5277w5d presents with decreased fetal movement and Right sided abdominal pain. Positive fetal movement after admitted to MAU. Reactive NST, no ctx. Cervix 1cm. Abdominal pain resolved. 1. Ruled out preterm labor 2. Braxton Hicks contractions  Plan: 1. Discharge home 2. Keep already scheduled follow-up apointment  Myriam JacobsonRobyn H Restrepo 5/8/20153:04 AM  I participated in the care of this patient and I agree with the above. Arabella MerlesKimberly D Browning [place] CNM 1:43 AM 12/14/2013

## 2013-12-11 ENCOUNTER — Encounter: Payer: Self-pay | Admitting: Obstetrics & Gynecology

## 2013-12-11 ENCOUNTER — Ambulatory Visit (INDEPENDENT_AMBULATORY_CARE_PROVIDER_SITE_OTHER): Payer: Managed Care, Other (non HMO) | Admitting: Obstetrics & Gynecology

## 2013-12-11 VITALS — BP 120/69 | HR 106 | Wt 197.0 lb

## 2013-12-11 DIAGNOSIS — Z3480 Encounter for supervision of other normal pregnancy, unspecified trimester: Secondary | ICD-10-CM

## 2013-12-11 DIAGNOSIS — IMO0002 Reserved for concepts with insufficient information to code with codable children: Secondary | ICD-10-CM

## 2013-12-11 LAB — OB RESULTS CONSOLE GBS: GBS: POSITIVE

## 2013-12-11 NOTE — Progress Notes (Signed)
Negative Homan's sign. Minimal pedal edema. No s/sx of preeclampsia. Reassurance given.

## 2013-12-11 NOTE — Progress Notes (Signed)
Unscheduled visit. BL LE edema, but left foot is more "plump" than the other. This started over the weekend. She has no h/o DVTs. Some cramping this morning. No ROM or VB. Labor precautions reviewed. Cervical cultures obtained.

## 2013-12-11 NOTE — Progress Notes (Signed)
Patient complains of increased swelling worse in left leg.  Not as noticeable today as it was yesterday.

## 2013-12-12 ENCOUNTER — Encounter (HOSPITAL_COMMUNITY): Payer: Self-pay | Admitting: *Deleted

## 2013-12-12 ENCOUNTER — Inpatient Hospital Stay (HOSPITAL_COMMUNITY)
Admission: AD | Admit: 2013-12-12 | Discharge: 2013-12-12 | Disposition: A | Discharge status: Home / Self Care | Payer: Managed Care, Other (non HMO) | Source: Ambulatory Visit | Attending: Obstetrics & Gynecology | Admitting: Obstetrics & Gynecology

## 2013-12-12 DIAGNOSIS — O4700 False labor before 37 completed weeks of gestation, unspecified trimester: Secondary | ICD-10-CM | POA: Insufficient documentation

## 2013-12-12 DIAGNOSIS — O4703 False labor before 37 completed weeks of gestation, third trimester: Secondary | ICD-10-CM

## 2013-12-12 LAB — URINALYSIS, ROUTINE W REFLEX MICROSCOPIC
Bilirubin Urine: NEGATIVE
GLUCOSE, UA: NEGATIVE mg/dL
Ketones, ur: NEGATIVE mg/dL
Leukocytes, UA: NEGATIVE
Nitrite: NEGATIVE
PH: 6.5 (ref 5.0–8.0)
PROTEIN: NEGATIVE mg/dL
Specific Gravity, Urine: 1.01 (ref 1.005–1.030)
Urobilinogen, UA: 0.2 mg/dL (ref 0.0–1.0)

## 2013-12-12 LAB — URINE MICROSCOPIC-ADD ON

## 2013-12-12 LAB — GC/CHLAMYDIA PROBE AMP
CT PROBE, AMP APTIMA: NEGATIVE
GC Probe RNA: NEGATIVE

## 2013-12-12 NOTE — MAU Note (Addendum)
PT SAYS SHE WENT TO APPOINTMENT  YESTERDAY-    IN [place]-  YESTERDAY   DR DOVE   SAID VE 4 CM    DENIES  HSV AND MRSA.    HAS BACK PAIN   COMES/ GOES.

## 2013-12-12 NOTE — MAU Provider Note (Signed)
Faculty Practice OB/GYN Attending MAU Note  Subjective:  28 y.o. [id number] at 2768w2d presents with occasional contractions; exam in office [facility] was 4 cm dilated on 12/11/13.  No LOF or vaginal bleeding. Good FM.    Objective:  Blood pressure 118/60, pulse 97, last menstrual period 04/09/2013. FHT  Baseline 140 bpm, moderate variability, +accelerations, no decelerations Toco: irregular Abdomen: NT gravid fundus, soft Cervix: 4/60/-2/vertex  Assessment & Plan:  28 y.o. [id number] at 9368w2d with false labor.  Reactive NST.  Discharge to home; fetal movement and preterm labor precautions reviewed. Follow up in clinic as scheduled next week.   Jaynie CollinsUGONNA  ANYANWU, MD, FACOG Attending Obstetrician & Gynecologist Faculty Practice, [facility]

## 2013-12-12 NOTE — Discharge Instructions (Signed)

## 2013-12-16 LAB — CULTURE, BETA STREP (GROUP B ONLY)

## 2013-12-19 ENCOUNTER — Ambulatory Visit (INDEPENDENT_AMBULATORY_CARE_PROVIDER_SITE_OTHER): Payer: Managed Care, Other (non HMO) | Admitting: Obstetrics & Gynecology

## 2013-12-19 VITALS — BP 109/78 | HR 110 | Wt 198.0 lb

## 2013-12-19 DIAGNOSIS — Z3480 Encounter for supervision of other normal pregnancy, unspecified trimester: Secondary | ICD-10-CM

## 2013-12-19 DIAGNOSIS — O09899 Supervision of other high risk pregnancies, unspecified trimester: Secondary | ICD-10-CM

## 2013-12-19 DIAGNOSIS — O99820 Streptococcus B carrier state complicating pregnancy: Secondary | ICD-10-CM

## 2013-12-19 DIAGNOSIS — Z22330 Carrier of Group B streptococcus: Secondary | ICD-10-CM

## 2013-12-19 NOTE — Patient Instructions (Signed)
Return to clinic for any obstetric concerns or go to [facility] for evaluation  

## 2013-12-19 NOTE — Progress Notes (Signed)
Still 4/70/-1/vertex.  GBS positive, discussed with patient.  No other complaints or concerns.  Fetal movement and labor precautions reviewed.

## 2013-12-26 ENCOUNTER — Ambulatory Visit (INDEPENDENT_AMBULATORY_CARE_PROVIDER_SITE_OTHER): Payer: Managed Care, Other (non HMO) | Admitting: Family Medicine

## 2013-12-26 VITALS — BP 116/68 | HR 97 | Wt 201.0 lb

## 2013-12-26 DIAGNOSIS — Z3480 Encounter for supervision of other normal pregnancy, unspecified trimester: Secondary | ICD-10-CM

## 2013-12-26 NOTE — Progress Notes (Signed)
Doing well No complaints Labor precuations

## 2013-12-26 NOTE — Patient Instructions (Signed)
Third Trimester of Pregnancy The third trimester is from week 29 through week 42, months 7 through 9. The third trimester is a time when the fetus is growing rapidly. At the end of the ninth month, the fetus is about 20 inches in length and weighs 6 10 pounds.  BODY CHANGES Your body goes through many changes during pregnancy. The changes vary from woman to woman.   Your weight will continue to increase. You can expect to gain 25 35 pounds (11 16 kg) by the end of the pregnancy.  You may begin to get stretch marks on your hips, abdomen, and breasts.  You may urinate more often because the fetus is moving lower into your pelvis and pressing on your bladder.  You may develop or continue to have heartburn as a result of your pregnancy.  You may develop constipation because certain hormones are causing the muscles that push waste through your intestines to slow down.  You may develop hemorrhoids or swollen, bulging veins (varicose veins).  You may have pelvic pain because of the weight gain and pregnancy hormones relaxing your joints between the bones in your pelvis. Back aches may result from over exertion of the muscles supporting your posture.  Your breasts will continue to grow and be tender. A yellow discharge may leak from your breasts called colostrum.  Your belly button may stick out.  You may feel short of breath because of your expanding uterus.  You may notice the fetus "dropping," or moving lower in your abdomen.  You may have a bloody mucus discharge. This usually occurs a few days to a week before labor begins.  Your cervix becomes thin and soft (effaced) near your due date. WHAT TO EXPECT AT YOUR PRENATAL EXAMS  You will have prenatal exams every 2 weeks until week 36. Then, you will have weekly prenatal exams. During a routine prenatal visit:  You will be weighed to make sure you and the fetus are growing normally.  Your blood pressure is taken.  Your abdomen will  be measured to track your baby's growth.  The fetal heartbeat will be listened to.  Any test results from the previous visit will be discussed.  You may have a cervical check near your due date to see if you have effaced. At around 36 weeks, your caregiver will check your cervix. At the same time, your caregiver will also perform a test on the secretions of the vaginal tissue. This test is to determine if a type of bacteria, Group B streptococcus, is present. Your caregiver will explain this further. Your caregiver may ask you:  What your birth plan is.  How you are feeling.  If you are feeling the baby move.  If you have had any abnormal symptoms, such as leaking fluid, bleeding, severe headaches, or abdominal cramping.  If you have any questions. Other tests or screenings that may be performed during your third trimester include:  Blood tests that check for low iron levels (anemia).  Fetal testing to check the health, activity level, and growth of the fetus. Testing is done if you have certain medical conditions or if there are problems during the pregnancy. FALSE LABOR You may feel small, irregular contractions that eventually go away. These are called Braxton Hicks contractions, or false labor. Contractions may last for hours, days, or even weeks before true labor sets in. If contractions come at regular intervals, intensify, or become painful, it is best to be seen by your caregiver.    SIGNS OF LABOR   Menstrual-like cramps.  Contractions that are 5 minutes apart or less.  Contractions that start on the top of the uterus and spread down to the lower abdomen and back.  A sense of increased pelvic pressure or back pain.  A watery or bloody mucus discharge that comes from the vagina. If you have any of these signs before the 37th week of pregnancy, call your caregiver right away. You need to go to the hospital to get checked immediately. HOME CARE INSTRUCTIONS   Avoid all  smoking, herbs, alcohol, and unprescribed drugs. These chemicals affect the formation and growth of the baby.  Follow your caregiver's instructions regarding medicine use. There are medicines that are either safe or unsafe to take during pregnancy.  Exercise only as directed by your caregiver. Experiencing uterine cramps is a good sign to stop exercising.  Continue to eat regular, healthy meals.  Wear a good support bra for breast tenderness.  Do not use hot tubs, steam rooms, or saunas.  Wear your seat belt at all times when driving.  Avoid raw meat, uncooked cheese, cat litter boxes, and soil used by cats. These carry germs that can cause birth defects in the baby.  Take your prenatal vitamins.  Try taking a stool softener (if your caregiver approves) if you develop constipation. Eat more high-fiber foods, such as fresh vegetables or fruit and whole grains. Drink plenty of fluids to keep your urine clear or pale yellow.  Take warm sitz baths to soothe any pain or discomfort caused by hemorrhoids. Use hemorrhoid cream if your caregiver approves.  If you develop varicose veins, wear support hose. Elevate your feet for 15 minutes, 3 4 times a day. Limit salt in your diet.  Avoid heavy lifting, wear low heal shoes, and practice good posture.  Rest a lot with your legs elevated if you have leg cramps or low back pain.  Visit your dentist if you have not gone during your pregnancy. Use a soft toothbrush to brush your teeth and be gentle when you floss.  A sexual relationship may be continued unless your caregiver directs you otherwise.  Do not travel far distances unless it is absolutely necessary and only with the approval of your caregiver.  Take prenatal classes to understand, practice, and ask questions about the labor and delivery.  Make a trial run to the hospital.  Pack your hospital bag.  Prepare the baby's nursery.  Continue to go to all your prenatal visits as directed  by your caregiver. SEEK MEDICAL CARE IF:  You are unsure if you are in labor or if your water has broken.  You have dizziness.  You have mild pelvic cramps, pelvic pressure, or nagging pain in your abdominal area.  You have persistent nausea, vomiting, or diarrhea.  You have a bad smelling vaginal discharge.  You have pain with urination. SEEK IMMEDIATE MEDICAL CARE IF:   You have a fever.  You are leaking fluid from your vagina.  You have spotting or bleeding from your vagina.  You have severe abdominal cramping or pain.  You have rapid weight loss or gain.  You have shortness of breath with chest pain.  You notice sudden or extreme swelling of your face, hands, ankles, feet, or legs.  You have not felt your baby move in over an hour.  You have severe headaches that do not go away with medicine.  You have vision changes. Document Released: 07/14/2001 Document Revised: 03/22/2013 Document Reviewed:   09/20/2012 [employer] Patient Information 2014 [employer].  Breastfeeding Deciding to breastfeed is one of the best choices you can make for you and your baby. A change in hormones during pregnancy causes your breast tissue to grow and increases the number and size of your milk ducts. These hormones also allow proteins, sugars, and fats from your blood supply to make breast milk in your milk-producing glands. Hormones prevent breast milk from being released before your baby is born as well as prompt milk flow after birth. Once breastfeeding has begun, thoughts of your baby, as well as his or her sucking or crying, can stimulate the release of milk from your milk-producing glands.  BENEFITS OF BREASTFEEDING For Your Baby  Your first milk (colostrum) helps your baby's digestive system function better.   There are antibodies in your milk that help your baby fight off infections.   Your baby has a lower incidence of asthma, allergies, and sudden infant death syndrome.    The nutrients in breast milk are better for your baby than infant formulas and are designed uniquely for your baby's needs.   Breast milk improves your baby's brain development.   Your baby is less likely to develop other conditions, such as childhood obesity, asthma, or type 2 diabetes mellitus.  For You   Breastfeeding helps to create a very special bond between you and your baby.   Breastfeeding is convenient. Breast milk is always available at the correct temperature and costs nothing.   Breastfeeding helps to burn calories and helps you lose the weight gained during pregnancy.   Breastfeeding makes your uterus contract to its prepregnancy size faster and slows bleeding (lochia) after you give birth.   Breastfeeding helps to lower your risk of developing type 2 diabetes mellitus, osteoporosis, and breast or ovarian cancer later in life. SIGNS THAT YOUR BABY IS HUNGRY Early Signs of Hunger  Increased alertness or activity.  Stretching.  Movement of the head from side to side.  Movement of the head and opening of the mouth when the corner of the mouth or cheek is stroked (rooting).  Increased sucking sounds, smacking lips, cooing, sighing, or squeaking.  Hand-to-mouth movements.  Increased sucking of fingers or hands. Late Signs of Hunger  Fussing.  Intermittent crying. Extreme Signs of Hunger Signs of extreme hunger will require calming and consoling before your baby will be able to breastfeed successfully. Do not wait for the following signs of extreme hunger to occur before you initiate breastfeeding:   Restlessness.  A loud, strong cry.   Screaming. BREASTFEEDING BASICS Breastfeeding Initiation  Find a comfortable place to sit or lie down, with your neck and back well supported.  Place a pillow or rolled up blanket under your baby to bring him or her to the level of your breast (if you are seated). Nursing pillows are specially designed to help  support your arms and your baby while you breastfeed.  Make sure that your baby's abdomen is facing your abdomen.   Gently massage your breast. With your fingertips, massage from your chest wall toward your nipple in a circular motion. This encourages milk flow. You may need to continue this action during the feeding if your milk flows slowly.  Support your breast with 4 fingers underneath and your thumb above your nipple. Make sure your fingers are well away from your nipple and your baby's mouth.   Stroke your baby's lips gently with your finger or nipple.   When your baby's mouth is   open wide enough, quickly bring your baby to your breast, placing your entire nipple and as much of the colored area around your nipple (areola) as possible into your baby's mouth.   More areola should be visible above your baby's upper lip than below the lower lip.   Your baby's tongue should be between his or her lower gum and your breast.   Ensure that your baby's mouth is correctly positioned around your nipple (latched). Your baby's lips should create a seal on your breast and be turned out (everted).  It is common for your baby to suck about 2 3 minutes in order to start the flow of breast milk. Latching Teaching your baby how to latch on to your breast properly is very important. An improper latch can cause nipple pain and decreased milk supply for you and poor weight gain in your baby. Also, if your baby is not latched onto your nipple properly, he or she may swallow some air during feeding. This can make your baby fussy. Burping your baby when you switch breasts during the feeding can help to get rid of the air. However, teaching your baby to latch on properly is still the best way to prevent fussiness from swallowing air while breastfeeding. Signs that your baby has successfully latched on to your nipple:    Silent tugging or silent sucking, without causing you pain.   Swallowing heard  between every 3 4 sucks.    Muscle movement above and in front of his or her ears while sucking.  Signs that your baby has not successfully latched on to nipple:   Sucking sounds or smacking sounds from your baby while breastfeeding.  Nipple pain. If you think your baby has not latched on correctly, slip your finger into the corner of your baby's mouth to break the suction and place it between your baby's gums. Attempt breastfeeding initiation again. Signs of Successful Breastfeeding Signs from your baby:   A gradual decrease in the number of sucks or complete cessation of sucking.   Falling asleep.   Relaxation of his or her body.   Retention of a small amount of milk in his or her mouth.   Letting go of your breast by himself or herself. Signs from you:  Breasts that have increased in firmness, weight, and size 1 3 hours after feeding.   Breasts that are softer immediately after breastfeeding.  Increased milk volume, as well as a change in milk consistency and color by the 5th day of breastfeeding.   Nipples that are not sore, cracked, or bleeding. Signs That Your Baby is Getting Enough Milk  Wetting at least 3 diapers in a 24-hour period. The urine should be clear and pale yellow by age 5 days.  At least 3 stools in a 24-hour period by age 5 days. The stool should be soft and yellow.  At least 3 stools in a 24-hour period by age 7 days. The stool should be seedy and yellow.  No loss of weight greater than 10% of birth weight during the first 3 days of age.  Average weight gain of 4 7 ounces (120 210 mL) per week after age 4 days.  Consistent daily weight gain by age 5 days, without weight loss after the age of 2 weeks. After a feeding, your baby may spit up a small amount. This is common. BREASTFEEDING FREQUENCY AND DURATION Frequent feeding will help you make more milk and can prevent sore nipples and breast engorgement.   Breastfeed when you feel the need to  reduce the fullness of your breasts or when your baby shows signs of hunger. This is called "breastfeeding on demand." Avoid introducing a pacifier to your baby while you are working to establish breastfeeding (the first 4 6 weeks after your baby is born). After this time you may choose to use a pacifier. Research has shown that pacifier use during the first year of a baby's life decreases the risk of sudden infant death syndrome (SIDS). Allow your baby to feed on each breast as long as he or she wants. Breastfeed until your baby is finished feeding. When your baby unlatches or falls asleep while feeding from the first breast, offer the second breast. Because newborns are often sleepy in the first few weeks of life, you may need to awaken your baby to get him or her to feed. Breastfeeding times will vary from baby to baby. However, the following rules can serve as a guide to help you ensure that your baby is properly fed:  Newborns (babies 4 weeks of age or younger) may breastfeed every 1 3 hours.  Newborns should not go longer than 3 hours during the day or 5 hours during the night without breastfeeding.  You should breastfeed your baby a minimum of 8 times in a 24-hour period until you begin to introduce solid foods to your baby at around 6 months of age. BREAST MILK PUMPING Pumping and storing breast milk allows you to ensure that your baby is exclusively fed your breast milk, even at times when you are unable to breastfeed. This is especially important if you are going back to work while you are still breastfeeding or when you are not able to be present during feedings. Your lactation consultant can give you guidelines on how long it is safe to store breast milk.  A breast pump is a machine that allows you to pump milk from your breast into a sterile bottle. The pumped breast milk can then be stored in a refrigerator or freezer. Some breast pumps are operated by hand, while others use electricity. Ask  your lactation consultant which type will work best for you. Breast pumps can be purchased, but some hospitals and breastfeeding support groups lease breast pumps on a monthly basis. A lactation consultant can teach you how to hand express breast milk, if you prefer not to use a pump.  CARING FOR YOUR BREASTS WHILE YOU BREASTFEED Nipples can become dry, cracked, and sore while breastfeeding. The following recommendations can help keep your breasts moisturized and healthy:  Avoid using soap on your nipples.   Wear a supportive bra. Although not required, special nursing bras and tank tops are designed to allow access to your breasts for breastfeeding without taking off your entire bra or top. Avoid wearing underwire style bras or extremely tight bras.  Air dry your nipples for 3 4minutes after each feeding.   Use only cotton bra pads to absorb leaked breast milk. Leaking of breast milk between feedings is normal.   Use lanolin on your nipples after breastfeeding. Lanolin helps to maintain your skin's normal moisture barrier. If you use pure lanolin you do not need to wash it off before feeding your baby again. Pure lanolin is not toxic to your baby. You may also hand express a few drops of breast milk and gently massage that milk into your nipples and allow the milk to air dry. In the first few weeks after giving birth, some women   experience extremely full breasts (engorgement). Engorgement can make your breasts feel heavy, warm, and tender to the touch. Engorgement peaks within 3 5 days after you give birth. The following recommendations can help ease engorgement:  Completely empty your breasts while breastfeeding or pumping. You may want to start by applying warm, moist heat (in the shower or with warm water-soaked hand towels) just before feeding or pumping. This increases circulation and helps the milk flow. If your baby does not completely empty your breasts while breastfeeding, pump any extra  milk after he or she is finished.  Wear a snug bra (nursing or regular) or tank top for 1 2 days to signal your body to slightly decrease milk production.  Apply ice packs to your breasts, unless this is too uncomfortable for you.  Make sure that your baby is latched on and positioned properly while breastfeeding. If engorgement persists after 48 hours of following these recommendations, contact your health care provider or a lactation consultant. OVERALL HEALTH CARE RECOMMENDATIONS WHILE BREASTFEEDING  Eat healthy foods. Alternate between meals and snacks, eating 3 of each per day. Because what you eat affects your breast milk, some of the foods may make your baby more irritable than usual. Avoid eating these foods if you are sure that they are negatively affecting your baby.  Drink milk, fruit juice, and water to satisfy your thirst (about 10 glasses a day).   Rest often, relax, and continue to take your prenatal vitamins to prevent fatigue, stress, and anemia.  Continue breast self-awareness checks.  Avoid chewing and smoking tobacco.  Avoid alcohol and drug use. Some medicines that may be harmful to your baby can pass through breast milk. It is important to ask your health care provider before taking any medicine, including all over-the-counter and prescription medicine as well as vitamin and herbal supplements. It is possible to become pregnant while breastfeeding. If birth control is desired, ask your health care provider about options that will be safe for your baby. SEEK MEDICAL CARE IF:   You feel like you want to stop breastfeeding or have become frustrated with breastfeeding.  You have painful breasts or nipples.  Your nipples are cracked or bleeding.  Your breasts are red, tender, or warm.  You have a swollen area on either breast.  You have a fever or chills.  You have nausea or vomiting.  You have drainage other than breast milk from your nipples.  Your breasts  do not become full before feedings by the 5th day after you give birth.  You feel sad and depressed.  Your baby is too sleepy to eat well.  Your baby is having trouble sleeping.   Your baby is wetting less than 3 diapers in a 24-hour period.  Your baby has less than 3 stools in a 24-hour period.  Your baby's skin or the white part of his or her eyes becomes yellow.   Your baby is not gaining weight by 5 days of age. SEEK IMMEDIATE MEDICAL CARE IF:   Your baby is overly tired (lethargic) and does not want to wake up and feed.  Your baby develops an unexplained fever. Document Released: 07/20/2005 Document Revised: 03/22/2013 Document Reviewed: 01/11/2013 [employer] Patient Information 2014 [employer].  

## 2014-01-02 ENCOUNTER — Encounter: Payer: Self-pay | Admitting: Family Medicine

## 2014-01-02 ENCOUNTER — Ambulatory Visit (INDEPENDENT_AMBULATORY_CARE_PROVIDER_SITE_OTHER): Payer: Managed Care, Other (non HMO) | Admitting: Family Medicine

## 2014-01-02 VITALS — BP 115/79 | HR 97 | Wt 203.0 lb

## 2014-01-02 DIAGNOSIS — Z3480 Encounter for supervision of other normal pregnancy, unspecified trimester: Secondary | ICD-10-CM

## 2014-01-02 NOTE — Progress Notes (Signed)
Patient is having increased contractions today and increased swelling but the contractions are not consistent.

## 2014-01-02 NOTE — Progress Notes (Signed)
Membranes stripped Labor precautions 

## 2014-01-02 NOTE — Patient Instructions (Signed)
Third Trimester of Pregnancy The third trimester is from week 29 through week 42, months 7 through 9. The third trimester is a time when the fetus is growing rapidly. At the end of the ninth month, the fetus is about 20 inches in length and weighs 6 10 pounds.  BODY CHANGES Your body goes through many changes during pregnancy. The changes vary from woman to woman.   Your weight will continue to increase. You can expect to gain 25 35 pounds (11 16 kg) by the end of the pregnancy.  You may begin to get stretch marks on your hips, abdomen, and breasts.  You may urinate more often because the fetus is moving lower into your pelvis and pressing on your bladder.  You may develop or continue to have heartburn as a result of your pregnancy.  You may develop constipation because certain hormones are causing the muscles that push waste through your intestines to slow down.  You may develop hemorrhoids or swollen, bulging veins (varicose veins).  You may have pelvic pain because of the weight gain and pregnancy hormones relaxing your joints between the bones in your pelvis. Back aches may result from over exertion of the muscles supporting your posture.  Your breasts will continue to grow and be tender. A yellow discharge may leak from your breasts called colostrum.  Your belly button may stick out.  You may feel short of breath because of your expanding uterus.  You may notice the fetus "dropping," or moving lower in your abdomen.  You may have a bloody mucus discharge. This usually occurs a few days to a week before labor begins.  Your cervix becomes thin and soft (effaced) near your due date. WHAT TO EXPECT AT YOUR PRENATAL EXAMS  You will have prenatal exams every 2 weeks until week 36. Then, you will have weekly prenatal exams. During a routine prenatal visit:  You will be weighed to make sure you and the fetus are growing normally.  Your blood pressure is taken.  Your abdomen will  be measured to track your baby's growth.  The fetal heartbeat will be listened to.  Any test results from the previous visit will be discussed.  You may have a cervical check near your due date to see if you have effaced. At around 36 weeks, your caregiver will check your cervix. At the same time, your caregiver will also perform a test on the secretions of the vaginal tissue. This test is to determine if a type of bacteria, Group B streptococcus, is present. Your caregiver will explain this further. Your caregiver may ask you:  What your birth plan is.  How you are feeling.  If you are feeling the baby move.  If you have had any abnormal symptoms, such as leaking fluid, bleeding, severe headaches, or abdominal cramping.  If you have any questions. Other tests or screenings that may be performed during your third trimester include:  Blood tests that check for low iron levels (anemia).  Fetal testing to check the health, activity level, and growth of the fetus. Testing is done if you have certain medical conditions or if there are problems during the pregnancy. FALSE LABOR You may feel small, irregular contractions that eventually go away. These are called Braxton Hicks contractions, or false labor. Contractions may last for hours, days, or even weeks before true labor sets in. If contractions come at regular intervals, intensify, or become painful, it is best to be seen by your caregiver.    SIGNS OF LABOR   Menstrual-like cramps.  Contractions that are 5 minutes apart or less.  Contractions that start on the top of the uterus and spread down to the lower abdomen and back.  A sense of increased pelvic pressure or back pain.  A watery or bloody mucus discharge that comes from the vagina. If you have any of these signs before the 37th week of pregnancy, call your caregiver right away. You need to go to the hospital to get checked immediately. HOME CARE INSTRUCTIONS   Avoid all  smoking, herbs, alcohol, and unprescribed drugs. These chemicals affect the formation and growth of the baby.  Follow your caregiver's instructions regarding medicine use. There are medicines that are either safe or unsafe to take during pregnancy.  Exercise only as directed by your caregiver. Experiencing uterine cramps is a good sign to stop exercising.  Continue to eat regular, healthy meals.  Wear a good support bra for breast tenderness.  Do not use hot tubs, steam rooms, or saunas.  Wear your seat belt at all times when driving.  Avoid raw meat, uncooked cheese, cat litter boxes, and soil used by cats. These carry germs that can cause birth defects in the baby.  Take your prenatal vitamins.  Try taking a stool softener (if your caregiver approves) if you develop constipation. Eat more high-fiber foods, such as fresh vegetables or fruit and whole grains. Drink plenty of fluids to keep your urine clear or pale yellow.  Take warm sitz baths to soothe any pain or discomfort caused by hemorrhoids. Use hemorrhoid cream if your caregiver approves.  If you develop varicose veins, wear support hose. Elevate your feet for 15 minutes, 3 4 times a day. Limit salt in your diet.  Avoid heavy lifting, wear low heal shoes, and practice good posture.  Rest a lot with your legs elevated if you have leg cramps or low back pain.  Visit your dentist if you have not gone during your pregnancy. Use a soft toothbrush to brush your teeth and be gentle when you floss.  A sexual relationship may be continued unless your caregiver directs you otherwise.  Do not travel far distances unless it is absolutely necessary and only with the approval of your caregiver.  Take prenatal classes to understand, practice, and ask questions about the labor and delivery.  Make a trial run to the hospital.  Pack your hospital bag.  Prepare the baby's nursery.  Continue to go to all your prenatal visits as directed  by your caregiver. SEEK MEDICAL CARE IF:  You are unsure if you are in labor or if your water has broken.  You have dizziness.  You have mild pelvic cramps, pelvic pressure, or nagging pain in your abdominal area.  You have persistent nausea, vomiting, or diarrhea.  You have a bad smelling vaginal discharge.  You have pain with urination. SEEK IMMEDIATE MEDICAL CARE IF:   You have a fever.  You are leaking fluid from your vagina.  You have spotting or bleeding from your vagina.  You have severe abdominal cramping or pain.  You have rapid weight loss or gain.  You have shortness of breath with chest pain.  You notice sudden or extreme swelling of your face, hands, ankles, feet, or legs.  You have not felt your baby move in over an hour.  You have severe headaches that do not go away with medicine.  You have vision changes. Document Released: 07/14/2001 Document Revised: 03/22/2013 Document Reviewed:   09/20/2012 [employer] Patient Information 2014 [employer].  Breastfeeding Deciding to breastfeed is one of the best choices you can make for you and your baby. A change in hormones during pregnancy causes your breast tissue to grow and increases the number and size of your milk ducts. These hormones also allow proteins, sugars, and fats from your blood supply to make breast milk in your milk-producing glands. Hormones prevent breast milk from being released before your baby is born as well as prompt milk flow after birth. Once breastfeeding has begun, thoughts of your baby, as well as his or her sucking or crying, can stimulate the release of milk from your milk-producing glands.  BENEFITS OF BREASTFEEDING For Your Baby  Your first milk (colostrum) helps your baby's digestive system function better.   There are antibodies in your milk that help your baby fight off infections.   Your baby has a lower incidence of asthma, allergies, and sudden infant death syndrome.    The nutrients in breast milk are better for your baby than infant formulas and are designed uniquely for your baby's needs.   Breast milk improves your baby's brain development.   Your baby is less likely to develop other conditions, such as childhood obesity, asthma, or type 2 diabetes mellitus.  For You   Breastfeeding helps to create a very special bond between you and your baby.   Breastfeeding is convenient. Breast milk is always available at the correct temperature and costs nothing.   Breastfeeding helps to burn calories and helps you lose the weight gained during pregnancy.   Breastfeeding makes your uterus contract to its prepregnancy size faster and slows bleeding (lochia) after you give birth.   Breastfeeding helps to lower your risk of developing type 2 diabetes mellitus, osteoporosis, and breast or ovarian cancer later in life. SIGNS THAT YOUR BABY IS HUNGRY Early Signs of Hunger  Increased alertness or activity.  Stretching.  Movement of the head from side to side.  Movement of the head and opening of the mouth when the corner of the mouth or cheek is stroked (rooting).  Increased sucking sounds, smacking lips, cooing, sighing, or squeaking.  Hand-to-mouth movements.  Increased sucking of fingers or hands. Late Signs of Hunger  Fussing.  Intermittent crying. Extreme Signs of Hunger Signs of extreme hunger will require calming and consoling before your baby will be able to breastfeed successfully. Do not wait for the following signs of extreme hunger to occur before you initiate breastfeeding:   Restlessness.  A loud, strong cry.   Screaming. BREASTFEEDING BASICS Breastfeeding Initiation  Find a comfortable place to sit or lie down, with your neck and back well supported.  Place a pillow or rolled up blanket under your baby to bring him or her to the level of your breast (if you are seated). Nursing pillows are specially designed to help  support your arms and your baby while you breastfeed.  Make sure that your baby's abdomen is facing your abdomen.   Gently massage your breast. With your fingertips, massage from your chest wall toward your nipple in a circular motion. This encourages milk flow. You may need to continue this action during the feeding if your milk flows slowly.  Support your breast with 4 fingers underneath and your thumb above your nipple. Make sure your fingers are well away from your nipple and your baby's mouth.   Stroke your baby's lips gently with your finger or nipple.   When your baby's mouth is   open wide enough, quickly bring your baby to your breast, placing your entire nipple and as much of the colored area around your nipple (areola) as possible into your baby's mouth.   More areola should be visible above your baby's upper lip than below the lower lip.   Your baby's tongue should be between his or her lower gum and your breast.   Ensure that your baby's mouth is correctly positioned around your nipple (latched). Your baby's lips should create a seal on your breast and be turned out (everted).  It is common for your baby to suck about 2 3 minutes in order to start the flow of breast milk. Latching Teaching your baby how to latch on to your breast properly is very important. An improper latch can cause nipple pain and decreased milk supply for you and poor weight gain in your baby. Also, if your baby is not latched onto your nipple properly, he or she may swallow some air during feeding. This can make your baby fussy. Burping your baby when you switch breasts during the feeding can help to get rid of the air. However, teaching your baby to latch on properly is still the best way to prevent fussiness from swallowing air while breastfeeding. Signs that your baby has successfully latched on to your nipple:    Silent tugging or silent sucking, without causing you pain.   Swallowing heard  between every 3 4 sucks.    Muscle movement above and in front of his or her ears while sucking.  Signs that your baby has not successfully latched on to nipple:   Sucking sounds or smacking sounds from your baby while breastfeeding.  Nipple pain. If you think your baby has not latched on correctly, slip your finger into the corner of your baby's mouth to break the suction and place it between your baby's gums. Attempt breastfeeding initiation again. Signs of Successful Breastfeeding Signs from your baby:   A gradual decrease in the number of sucks or complete cessation of sucking.   Falling asleep.   Relaxation of his or her body.   Retention of a small amount of milk in his or her mouth.   Letting go of your breast by himself or herself. Signs from you:  Breasts that have increased in firmness, weight, and size 1 3 hours after feeding.   Breasts that are softer immediately after breastfeeding.  Increased milk volume, as well as a change in milk consistency and color by the 5th day of breastfeeding.   Nipples that are not sore, cracked, or bleeding. Signs That Your Baby is Getting Enough Milk  Wetting at least 3 diapers in a 24-hour period. The urine should be clear and pale yellow by age 5 days.  At least 3 stools in a 24-hour period by age 5 days. The stool should be soft and yellow.  At least 3 stools in a 24-hour period by age 7 days. The stool should be seedy and yellow.  No loss of weight greater than 10% of birth weight during the first 3 days of age.  Average weight gain of 4 7 ounces (120 210 mL) per week after age 4 days.  Consistent daily weight gain by age 5 days, without weight loss after the age of 2 weeks. After a feeding, your baby may spit up a small amount. This is common. BREASTFEEDING FREQUENCY AND DURATION Frequent feeding will help you make more milk and can prevent sore nipples and breast engorgement.   Breastfeed when you feel the need to  reduce the fullness of your breasts or when your baby shows signs of hunger. This is called "breastfeeding on demand." Avoid introducing a pacifier to your baby while you are working to establish breastfeeding (the first 4 6 weeks after your baby is born). After this time you may choose to use a pacifier. Research has shown that pacifier use during the first year of a baby's life decreases the risk of sudden infant death syndrome (SIDS). Allow your baby to feed on each breast as long as he or she wants. Breastfeed until your baby is finished feeding. When your baby unlatches or falls asleep while feeding from the first breast, offer the second breast. Because newborns are often sleepy in the first few weeks of life, you may need to awaken your baby to get him or her to feed. Breastfeeding times will vary from baby to baby. However, the following rules can serve as a guide to help you ensure that your baby is properly fed:  Newborns (babies 4 weeks of age or younger) may breastfeed every 1 3 hours.  Newborns should not go longer than 3 hours during the day or 5 hours during the night without breastfeeding.  You should breastfeed your baby a minimum of 8 times in a 24-hour period until you begin to introduce solid foods to your baby at around 6 months of age. BREAST MILK PUMPING Pumping and storing breast milk allows you to ensure that your baby is exclusively fed your breast milk, even at times when you are unable to breastfeed. This is especially important if you are going back to work while you are still breastfeeding or when you are not able to be present during feedings. Your lactation consultant can give you guidelines on how long it is safe to store breast milk.  A breast pump is a machine that allows you to pump milk from your breast into a sterile bottle. The pumped breast milk can then be stored in a refrigerator or freezer. Some breast pumps are operated by hand, while others use electricity. Ask  your lactation consultant which type will work best for you. Breast pumps can be purchased, but some hospitals and breastfeeding support groups lease breast pumps on a monthly basis. A lactation consultant can teach you how to hand express breast milk, if you prefer not to use a pump.  CARING FOR YOUR BREASTS WHILE YOU BREASTFEED Nipples can become dry, cracked, and sore while breastfeeding. The following recommendations can help keep your breasts moisturized and healthy:  Avoid using soap on your nipples.   Wear a supportive bra. Although not required, special nursing bras and tank tops are designed to allow access to your breasts for breastfeeding without taking off your entire bra or top. Avoid wearing underwire style bras or extremely tight bras.  Air dry your nipples for 3 4minutes after each feeding.   Use only cotton bra pads to absorb leaked breast milk. Leaking of breast milk between feedings is normal.   Use lanolin on your nipples after breastfeeding. Lanolin helps to maintain your skin's normal moisture barrier. If you use pure lanolin you do not need to wash it off before feeding your baby again. Pure lanolin is not toxic to your baby. You may also hand express a few drops of breast milk and gently massage that milk into your nipples and allow the milk to air dry. In the first few weeks after giving birth, some women   experience extremely full breasts (engorgement). Engorgement can make your breasts feel heavy, warm, and tender to the touch. Engorgement peaks within 3 5 days after you give birth. The following recommendations can help ease engorgement:  Completely empty your breasts while breastfeeding or pumping. You may want to start by applying warm, moist heat (in the shower or with warm water-soaked hand towels) just before feeding or pumping. This increases circulation and helps the milk flow. If your baby does not completely empty your breasts while breastfeeding, pump any extra  milk after he or she is finished.  Wear a snug bra (nursing or regular) or tank top for 1 2 days to signal your body to slightly decrease milk production.  Apply ice packs to your breasts, unless this is too uncomfortable for you.  Make sure that your baby is latched on and positioned properly while breastfeeding. If engorgement persists after 48 hours of following these recommendations, contact your health care provider or a lactation consultant. OVERALL HEALTH CARE RECOMMENDATIONS WHILE BREASTFEEDING  Eat healthy foods. Alternate between meals and snacks, eating 3 of each per day. Because what you eat affects your breast milk, some of the foods may make your baby more irritable than usual. Avoid eating these foods if you are sure that they are negatively affecting your baby.  Drink milk, fruit juice, and water to satisfy your thirst (about 10 glasses a day).   Rest often, relax, and continue to take your prenatal vitamins to prevent fatigue, stress, and anemia.  Continue breast self-awareness checks.  Avoid chewing and smoking tobacco.  Avoid alcohol and drug use. Some medicines that may be harmful to your baby can pass through breast milk. It is important to ask your health care provider before taking any medicine, including all over-the-counter and prescription medicine as well as vitamin and herbal supplements. It is possible to become pregnant while breastfeeding. If birth control is desired, ask your health care provider about options that will be safe for your baby. SEEK MEDICAL CARE IF:   You feel like you want to stop breastfeeding or have become frustrated with breastfeeding.  You have painful breasts or nipples.  Your nipples are cracked or bleeding.  Your breasts are red, tender, or warm.  You have a swollen area on either breast.  You have a fever or chills.  You have nausea or vomiting.  You have drainage other than breast milk from your nipples.  Your breasts  do not become full before feedings by the 5th day after you give birth.  You feel sad and depressed.  Your baby is too sleepy to eat well.  Your baby is having trouble sleeping.   Your baby is wetting less than 3 diapers in a 24-hour period.  Your baby has less than 3 stools in a 24-hour period.  Your baby's skin or the white part of his or her eyes becomes yellow.   Your baby is not gaining weight by 5 days of age. SEEK IMMEDIATE MEDICAL CARE IF:   Your baby is overly tired (lethargic) and does not want to wake up and feed.  Your baby develops an unexplained fever. Document Released: 07/20/2005 Document Revised: 03/22/2013 Document Reviewed: 01/11/2013 [employer] Patient Information 2014 [employer].  

## 2014-01-03 ENCOUNTER — Encounter (HOSPITAL_COMMUNITY): Payer: Managed Care, Other (non HMO) | Admitting: Anesthesiology

## 2014-01-03 ENCOUNTER — Inpatient Hospital Stay (HOSPITAL_COMMUNITY): Payer: Managed Care, Other (non HMO) | Admitting: Anesthesiology

## 2014-01-03 ENCOUNTER — Inpatient Hospital Stay (HOSPITAL_COMMUNITY)
Admission: AD | Admit: 2014-01-03 | Discharge: 2014-01-05 | DRG: 774 | Disposition: A | Discharge status: Home / Self Care | Payer: Managed Care, Other (non HMO) | Source: Ambulatory Visit | Attending: Obstetrics & Gynecology | Admitting: Obstetrics & Gynecology

## 2014-01-03 ENCOUNTER — Encounter (HOSPITAL_COMMUNITY): Payer: Self-pay | Admitting: *Deleted

## 2014-01-03 DIAGNOSIS — O99892 Other specified diseases and conditions complicating childbirth: Secondary | ICD-10-CM

## 2014-01-03 DIAGNOSIS — Z3480 Encounter for supervision of other normal pregnancy, unspecified trimester: Secondary | ICD-10-CM

## 2014-01-03 DIAGNOSIS — O9989 Other specified diseases and conditions complicating pregnancy, childbirth and the puerperium: Secondary | ICD-10-CM

## 2014-01-03 DIAGNOSIS — B069 Rubella without complication: Secondary | ICD-10-CM | POA: Diagnosis present

## 2014-01-03 DIAGNOSIS — Z22330 Carrier of Group B streptococcus: Secondary | ICD-10-CM

## 2014-01-03 DIAGNOSIS — O09899 Supervision of other high risk pregnancies, unspecified trimester: Secondary | ICD-10-CM

## 2014-01-03 DIAGNOSIS — O9852 Other viral diseases complicating childbirth: Secondary | ICD-10-CM

## 2014-01-03 DIAGNOSIS — O09219 Supervision of pregnancy with history of pre-term labor, unspecified trimester: Secondary | ICD-10-CM

## 2014-01-03 DIAGNOSIS — IMO0001 Reserved for inherently not codable concepts without codable children: Secondary | ICD-10-CM

## 2014-01-03 DIAGNOSIS — O99820 Streptococcus B carrier state complicating pregnancy: Secondary | ICD-10-CM

## 2014-01-03 DIAGNOSIS — Z8249 Family history of ischemic heart disease and other diseases of the circulatory system: Secondary | ICD-10-CM

## 2014-01-03 DIAGNOSIS — Z283 Underimmunization status: Secondary | ICD-10-CM

## 2014-01-03 LAB — TYPE AND SCREEN
ABO/RH(D): A POS
Antibody Screen: NEGATIVE

## 2014-01-03 LAB — CBC
HCT: 34.9 % — ABNORMAL LOW (ref 36.0–46.0)
Hemoglobin: 11.4 g/dL — ABNORMAL LOW (ref 12.0–15.0)
MCH: 30 pg (ref 26.0–34.0)
MCHC: 32.7 g/dL (ref 30.0–36.0)
MCV: 91.8 fL (ref 78.0–100.0)
PLATELETS: 141 10*3/uL — AB (ref 150–400)
RBC: 3.8 MIL/uL — AB (ref 3.87–5.11)
RDW: 15.6 % — AB (ref 11.5–15.5)
WBC: 12.6 10*3/uL — AB (ref 4.0–10.5)

## 2014-01-03 LAB — RPR

## 2014-01-03 LAB — HEPATITIS B SURFACE ANTIGEN: HEP B S AG: NEGATIVE

## 2014-01-03 MED ORDER — DIPHENHYDRAMINE HCL 50 MG/ML IJ SOLN: 12.5000 mg | INTRAMUSCULAR | Status: DC | PRN

## 2014-01-03 MED ORDER — LIDOCAINE HCL (PF) 1 % IJ SOLN
INTRAMUSCULAR | Status: DC | PRN
  Administered 2014-01-03 (×4): 4 mL

## 2014-01-03 MED ORDER — OXYTOCIN BOLUS FROM INFUSION: 500.0000 mL | INTRAVENOUS | Status: DC

## 2014-01-03 MED ORDER — LACTATED RINGERS IV SOLN
500.0000 mL | Freq: Once | INTRAVENOUS | Status: AC
Start: 2014-01-03 — End: 2014-01-03
  Administered 2014-01-03: 04:00:00 via INTRAVENOUS

## 2014-01-03 MED ORDER — DIBUCAINE 1 % RE OINT: 1.0000 "application " | TOPICAL_OINTMENT | RECTAL | Status: DC | PRN

## 2014-01-03 MED ORDER — LACTATED RINGERS IV SOLN
INTRAVENOUS | Status: DC
Start: 2014-01-03 — End: 2014-01-03
  Administered 2014-01-03 (×2): via INTRAVENOUS

## 2014-01-03 MED ORDER — LANOLIN HYDROUS EX OINT: 1.0000 "application " | TOPICAL_OINTMENT | CUTANEOUS | Status: DC | PRN

## 2014-01-03 MED ORDER — ACETAMINOPHEN 325 MG PO TABS: 650.0000 mg | ORAL_TABLET | ORAL | Status: DC | PRN

## 2014-01-03 MED ORDER — PHENYLEPHRINE 40 MCG/ML (10ML) SYRINGE FOR IV PUSH (FOR BLOOD PRESSURE SUPPORT)
80.0000 ug | PREFILLED_SYRINGE | INTRAVENOUS | Status: DC | PRN
  Filled 2014-01-03: qty 2

## 2014-01-03 MED ORDER — ZOLPIDEM TARTRATE 5 MG PO TABS: 5.0000 mg | ORAL_TABLET | Freq: Every evening | ORAL | Status: DC | PRN

## 2014-01-03 MED ORDER — SENNOSIDES-DOCUSATE SODIUM 8.6-50 MG PO TABS
2.0000 | ORAL_TABLET | ORAL | Status: DC
  Administered 2014-01-04 (×2): 2 via ORAL
  Filled 2014-01-03 (×2): qty 2

## 2014-01-03 MED ORDER — OXYCODONE-ACETAMINOPHEN 5-325 MG PO TABS: 1.0000 | ORAL_TABLET | ORAL | Status: DC | PRN

## 2014-01-03 MED ORDER — PENICILLIN G POTASSIUM 5000000 UNITS IJ SOLR
2.5000 10*6.[IU] | INTRAMUSCULAR | Status: DC
  Filled 2014-01-03 (×3): qty 2.5

## 2014-01-03 MED ORDER — MEASLES, MUMPS & RUBELLA VAC ~~LOC~~ INJ
0.5000 mL | INJECTION | Freq: Once | SUBCUTANEOUS | Status: AC
  Administered 2014-01-05: 0.5 mL via SUBCUTANEOUS
  Filled 2014-01-03 (×2): qty 0.5

## 2014-01-03 MED ORDER — ONDANSETRON HCL 4 MG/2ML IJ SOLN: 4.0000 mg | INTRAMUSCULAR | Status: DC | PRN

## 2014-01-03 MED ORDER — DIPHENHYDRAMINE HCL 25 MG PO CAPS: 25.0000 mg | ORAL_CAPSULE | Freq: Four times a day (QID) | ORAL | Status: DC | PRN

## 2014-01-03 MED ORDER — ONDANSETRON HCL 4 MG/2ML IJ SOLN: 4.0000 mg | Freq: Four times a day (QID) | INTRAMUSCULAR | Status: DC | PRN

## 2014-01-03 MED ORDER — PHENYLEPHRINE 40 MCG/ML (10ML) SYRINGE FOR IV PUSH (FOR BLOOD PRESSURE SUPPORT)
80.0000 ug | PREFILLED_SYRINGE | INTRAVENOUS | Status: DC | PRN
  Filled 2014-01-03: qty 10
  Filled 2014-01-03: qty 2

## 2014-01-03 MED ORDER — EPHEDRINE 5 MG/ML INJ
10.0000 mg | INTRAVENOUS | Status: DC | PRN
  Filled 2014-01-03: qty 2

## 2014-01-03 MED ORDER — SIMETHICONE 80 MG PO CHEW: 80.0000 mg | CHEWABLE_TABLET | ORAL | Status: DC | PRN

## 2014-01-03 MED ORDER — METHYLERGONOVINE MALEATE 0.2 MG/ML IJ SOLN: 0.2000 mg | INTRAMUSCULAR | Status: DC | PRN

## 2014-01-03 MED ORDER — TETANUS-DIPHTH-ACELL PERTUSSIS 5-2.5-18.5 LF-MCG/0.5 IM SUSP: 0.5000 mL | Freq: Once | INTRAMUSCULAR | Status: DC

## 2014-01-03 MED ORDER — LACTATED RINGERS IV SOLN: 500.0000 mL | INTRAVENOUS | Status: DC | PRN

## 2014-01-03 MED ORDER — LIDOCAINE HCL (PF) 1 % IJ SOLN
30.0000 mL | INTRAMUSCULAR | Status: DC | PRN
  Filled 2014-01-03: qty 30

## 2014-01-03 MED ORDER — FENTANYL CITRATE 0.05 MG/ML IJ SOLN: 100.0000 ug | INTRAMUSCULAR | Status: DC | PRN

## 2014-01-03 MED ORDER — FENTANYL 2.5 MCG/ML BUPIVACAINE 1/10 % EPIDURAL INFUSION (WH - ANES)
14.0000 mL/h | INTRAMUSCULAR | Status: DC | PRN
  Administered 2014-01-03: 14 mL/h via EPIDURAL
  Filled 2014-01-03: qty 125

## 2014-01-03 MED ORDER — BENZOCAINE-MENTHOL 20-0.5 % EX AERO
1.0000 "application " | INHALATION_SPRAY | CUTANEOUS | Status: DC | PRN
  Administered 2014-01-03: 1 via TOPICAL
  Filled 2014-01-03: qty 56

## 2014-01-03 MED ORDER — PRENATAL MULTIVITAMIN CH
1.0000 | ORAL_TABLET | Freq: Every day | ORAL | Status: DC
  Administered 2014-01-03 – 2014-01-04 (×2): 1 via ORAL
  Filled 2014-01-03 (×2): qty 1

## 2014-01-03 MED ORDER — OXYCODONE-ACETAMINOPHEN 5-325 MG PO TABS
1.0000 | ORAL_TABLET | ORAL | Status: DC | PRN
  Administered 2014-01-03 – 2014-01-05 (×2): 1 via ORAL
  Filled 2014-01-03 (×2): qty 1

## 2014-01-03 MED ORDER — WITCH HAZEL-GLYCERIN EX PADS: 1.0000 "application " | MEDICATED_PAD | CUTANEOUS | Status: DC | PRN

## 2014-01-03 MED ORDER — CITRIC ACID-SODIUM CITRATE 334-500 MG/5ML PO SOLN: 30.0000 mL | ORAL | Status: DC | PRN

## 2014-01-03 MED ORDER — ONDANSETRON HCL 4 MG PO TABS: 4.0000 mg | ORAL_TABLET | ORAL | Status: DC | PRN

## 2014-01-03 MED ORDER — IBUPROFEN 600 MG PO TABS
600.0000 mg | ORAL_TABLET | Freq: Four times a day (QID) | ORAL | Status: DC
  Administered 2014-01-03 – 2014-01-05 (×8): 600 mg via ORAL
  Filled 2014-01-03 (×8): qty 1

## 2014-01-03 MED ORDER — EPHEDRINE 5 MG/ML INJ
10.0000 mg | INTRAVENOUS | Status: DC | PRN
  Filled 2014-01-03: qty 2
  Filled 2014-01-03: qty 4

## 2014-01-03 MED ORDER — METHYLERGONOVINE MALEATE 0.2 MG PO TABS: 0.2000 mg | ORAL_TABLET | ORAL | Status: DC | PRN

## 2014-01-03 MED ORDER — OXYTOCIN 40 UNITS IN LACTATED RINGERS INFUSION - SIMPLE MED
62.5000 mL/h | INTRAVENOUS | Status: DC
  Filled 2014-01-03: qty 1000

## 2014-01-03 MED ORDER — DEXTROSE 5 % IV SOLN
5.0000 10*6.[IU] | Freq: Once | INTRAVENOUS | Status: AC
  Administered 2014-01-03: 5 10*6.[IU] via INTRAVENOUS
  Filled 2014-01-03: qty 5

## 2014-01-03 MED ORDER — FERROUS SULFATE 325 (65 FE) MG PO TABS
325.0000 mg | ORAL_TABLET | Freq: Two times a day (BID) | ORAL | Status: DC
  Administered 2014-01-03 – 2014-01-05 (×4): 325 mg via ORAL
  Filled 2014-01-03 (×4): qty 1

## 2014-01-03 MED ORDER — MAGNESIUM HYDROXIDE 400 MG/5ML PO SUSP: 30.0000 mL | ORAL | Status: DC | PRN

## 2014-01-03 MED ORDER — IBUPROFEN 600 MG PO TABS: 600.0000 mg | ORAL_TABLET | Freq: Four times a day (QID) | ORAL | Status: DC | PRN

## 2014-01-03 NOTE — H&P (Signed)
HPI: Julia Levine is a 28 y.o. year old 604P1111 female at 6180w3d weeks gestation by 7 week US who presents to [facility] reporting Labor.  Patient Active Problem List   Diagnosis Date Noted  . Active labor at term 01/03/2014  . Group B Streptococcus carrier, +RV culture, currently pregnant 12/19/2013  . Supervision of other normal pregnancy 05/30/2013  . Pregnancy with history of pre-term labor-PPROM at 24 wks/Twins 05/30/2013  . Rubella non-immune status, antepartum 05/12/2013  . Hypermenorrhea 02/10/2013  . LGSIL (low grade squamous intraepithelial dysplasia) 01/27/2012   [facility]  Dating LMP/Ultrasound: 7 weeks        Ultrasound consistent with LMP: Yes  Genetic Screen 1 Screen:      Normal 07/07/13           AFP:  Normal                   Anatomic US  Normal- needs f/U CL -> 3.8 cm   GTT Early:               Third trimester: 133  TDaP vaccine 10/23/2013  Flu vaccine   GBS Pos  Baby Food   Contraception   Circumcision   Pediatrician     Maternal Medical History:  Reason for admission: Nausea.    OB History   Grav Para Term Preterm Abortions TAB SAB Ect Mult Living   4 2 1 1 1  0 1 0 1 1     Past Medical History  Diagnosis Date  . 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 Colposcopy  10/2011    abnormal pap HPV   Family History: family history includes Hypertension in her mother. Social History:  reports that she has never smoked. She has never used smokeless tobacco. She reports that she does not drink alcohol or use illicit drugs.   Prenatal Transfer Tool  Maternal Diabetes: No Genetic Screening: Normal Maternal Ultrasounds/Referrals: Normal Fetal Ultrasounds or other Referrals:  None Maternal Substance Abuse:  No Significant Maternal Medications:  Meds include: Progesterone Significant Maternal Lab Results:  Lab values include: Group B Strep positive Other Comments:  None  Review of Systems   Constitutional: Negative for fever and chills.  Eyes: Negative for blurred vision and double vision.  Gastrointestinal: Positive for abdominal pain (contractions). Negative for nausea and vomiting.  Neurological: Negative for headaches.    Dilation: 7 Effacement (%): 90 Station: -1 Exam by:: Belinda  Height 5\' 7"  (1.702 m), weight 92.08 kg (203 lb), last menstrual period 04/09/2013. Maternal Exam:  Uterine Assessment: Contraction strength is moderate.  Contraction frequency is regular.   Abdomen: Estimated fetal weight is 7 lb 8oz.   Fetal presentation: vertex  Introitus: Normal vulva. Pelvis: adequate for delivery.   Cervix: Cervix evaluated by digital exam.     Fetal Exam Fetal Monitor Review: Mode: ultrasound.   Baseline rate: 120.  Variability: moderate (6-25 bpm).   Pattern: accelerations present and no decelerations.    Fetal State Assessment: Category I - tracings are normal.     Physical Exam  Nursing note and vitals reviewed. Constitutional: She is oriented to person, place, and time. She appears well-developed and well-nourished. She appears distressed.  HENT:  Head: Normocephalic.  Eyes: Conjunctivae are normal.  Cardiovascular: Normal rate, regular rhythm and normal heart sounds.   Respiratory: Effort normal and breath sounds normal.  GI: Soft. There is no tenderness.  Genitourinary: Vagina normal.  Musculoskeletal: Normal range of motion. She exhibits edema. She exhibits no tenderness.  Neurological: She is alert and oriented to person, place, and time. She has normal reflexes.  Skin: Skin is warm.  Psychiatric: She has a normal mood and affect.    Prenatal labs: ABO, Rh:  A pos Antibody:  Pending Rubella: Nonimmune (10/09 0000) RPR: NON REAC (03/23 1357)  HBsAg:   Pending HIV: NON REACTIVE (03/23 1357)  GBS:   Neg 1 hour GTT 133  Assessment: 1. Labor: active 2. Fetal Wellbeing: Category I  3. Pain Control: None 4. GBS: Pos 5. 38.3 week  IUP  Plan:  1. Admit to BS per consult with MD 2. Routine L&D orders 3. Analgesia/anesthesia PRN   Dorathy Kinsman 01/03/2014, 3:40 AM

## 2014-01-03 NOTE — Anesthesia Preprocedure Evaluation (Signed)
Anesthesia Evaluation  Patient identified by MRN, date of birth, ID band Patient awake    Reviewed: Allergy & Precautions, H&P , NPO status , Patient's Chart, lab work & pertinent test results, reviewed documented beta blocker date and time   History of Anesthesia Complications Negative for: history of anesthetic complications  Airway Mallampati: I TM Distance: >3 FB Neck ROM: full    Dental  (+) Teeth Intact   Pulmonary neg pulmonary ROS,  breath sounds clear to auscultation        Cardiovascular negative cardio ROS  Rhythm:regular Rate:Normal     Neuro/Psych negative neurological ROS  negative psych ROS   GI/Hepatic negative GI ROS, Neg liver ROS,   Endo/Other  BMI 31.9  Renal/GU negative Renal ROS     Musculoskeletal   Abdominal   Peds  Hematology negative hematology ROS (+) plt 141   Anesthesia Other Findings Last delivery was 24 week twins who did not survive.  This pregnancy is normal term delivery.  Reproductive/Obstetrics (+) Pregnancy                           Anesthesia Physical Anesthesia Plan  ASA: II  Anesthesia Plan: Epidural   Post-op Pain Management:    Induction:   Airway Management Planned:   Additional Equipment:   Intra-op Plan:   Post-operative Plan:   Informed Consent: I have reviewed the patients History and Physical, chart, labs and discussed the procedure including the risks, benefits and alternatives for the proposed anesthesia with the patient or authorized representative who has indicated his/her understanding and acceptance.     Plan Discussed with:   Anesthesia Plan Comments:         Anesthesia Quick Evaluation

## 2014-01-03 NOTE — MAU Note (Signed)
Pt states she has been having contraction every 5-65min for about 1hour

## 2014-01-03 NOTE — Anesthesia Procedure Notes (Signed)
Epidural Patient location during procedure: OB Start time: 01/03/2014 4:37 AM  Staffing Performed by: anesthesiologist   Preanesthetic Checklist Completed: patient identified, site marked, surgical consent, pre-op evaluation, timeout performed, IV checked, risks and benefits discussed and monitors and equipment checked  Epidural Patient position: sitting Prep: site prepped and draped and DuraPrep Patient monitoring: continuous pulse ox and blood pressure Approach: midline Injection technique: LOR air  Needle:  Needle type: Tuohy  Needle gauge: 17 G Needle length: 9 cm and 9 Needle insertion depth: 5 cm cm Catheter type: closed end flexible Catheter size: 19 Gauge Catheter at skin depth: 10 cm Test dose: negative  Assessment Events: blood not aspirated, injection not painful, no injection resistance, negative IV test and no paresthesia  Additional Notes Discussed risk of headache, infection, bleeding, nerve injury and failed or incomplete block.  Patient voices understanding and wishes to proceed.\  Epidural placed easily on first attempt.  No paresthesia.  Patient tolerated procedure well with no apparent complications.  Jasmine December, MDReason for block:procedure for pain

## 2014-01-04 NOTE — Anesthesia Postprocedure Evaluation (Signed)
  Anesthesia Post-op Note  Patient: Julia Levine  Procedure(s) Performed: * No procedures listed *  Patient Location: Mother/Baby  Anesthesia Type:Epidural  Level of Consciousness: awake, alert , oriented and patient cooperative  Airway and Oxygen Therapy: Patient Spontanous Breathing  Post-op Pain: mild  Post-op Assessment: Patient's Cardiovascular Status Stable, Respiratory Function Stable, No headache, No backache, No residual numbness and No residual motor weakness  Post-op Vital Signs: stable  Last Vitals:  Filed Vitals:   01/04/14 0601  BP: 106/61  Pulse: 85  Temp: 36.9 C  Resp: 19    Complications: No apparent anesthesia complications

## 2014-01-04 NOTE — Progress Notes (Signed)
Post Partum Day 1 Subjective: no complaints, up ad lib, voiding, tolerating PO and + flatus  Objective: Blood pressure 106/61, pulse 85, temperature 98.5 F (36.9 C), temperature source Oral, resp. rate 19, height 5\' 7"  (1.702 m), weight 92.08 kg (203 lb), last menstrual period 04/09/2013, SpO2 98.00%, unknown if currently breastfeeding.  Physical Exam:  General: alert, cooperative, appears stated age and no distress Lochia: appropriate Uterine Fundus: Firm U-1 DVT Evaluation: No evidence of DVT seen on physical exam. Negative Homan's sign. No cords or calf tenderness. No significant calf/ankle edema.   Recent Labs  01/03/14 0311  HGB 11.4*  HCT 34.9*    Assessment/Plan: Plan for discharge tomorrow and Contraception Mirena   LOS: 1 day   Julia Levine 01/04/2014, 9:48 AM

## 2014-01-05 MED ORDER — IBUPROFEN 600 MG PO TABS: 600.0000 mg | ORAL_TABLET | Freq: Four times a day (QID) | ORAL | Status: DC

## 2014-01-05 NOTE — Discharge Instructions (Signed)

## 2014-01-05 NOTE — Discharge Summary (Signed)
Obstetric Discharge Summary Reason for Admission: onset of labor Prenatal Procedures: ultrasound Intrapartum Procedures: spontaneous vaginal delivery Postpartum Procedures: none Complications-Operative and Postpartum: none Hemoglobin  Date Value Ref Range Status  01/03/2014 11.4* 12.0 - 15.0 g/dL Final     HCT  Date Value Ref Range Status  01/03/2014 34.9* 36.0 - 46.0 % Final   Julia Levine is a 28 y.o. year old G34P1111 female at [redacted]w[redacted]d weeks gestation by 7 week [place] who presented for onset of Labor.  At 6:11 AM (6/03) a viable and healthy female was delivered via Vaginal, Spontaneous Delivery (Presentation: Right Occiput Transverse). APGAR: 9, 9. Placenta status: Intact, Spontaneous. Vaginal laceration was repaired.  Baby did not receive appropriate GBS prophylaxis treatment. Bottle feeding and plans on mirena for contraception. Will have circumcision as outpatient.   Physical Exam:  General: alert, cooperative and no distress Lochia: appropriate Uterine Fundus: firm Incision: n/a DVT Evaluation: No evidence of DVT seen on physical exam.  Discharge Diagnoses: Term Pregnancy-delivered  Discharge Information: Date: 01/05/2014 Activity: unrestricted Diet: routine Medications: Ibuprofen Condition: stable Instructions: refer to practice specific booklet Discharge to: home Follow-up Information   [facility] with [facility]. (postpartum follow up)    Specialty:  Obstetrics and Gynecology   Contact information:   [address] [place] [address] [phone]      Newborn Data: Live born female  Birth Weight: 8 lb 8.4 oz (3867 g) APGAR: 9, 9  Home with mother.  Myra Rude 01/05/2014, 7:23 AM  I have seen and examined this patient and agree with above documentation in the resident's note.   Rulon Abide, M.D. [facility] Fellow 01/05/2014 7:56 AM

## 2014-01-08 NOTE — Discharge Summary (Signed)
Attestation of Attending Supervision of Advanced Practitioner (CNM/NP): Evaluation and management procedures were performed by the Advanced Practitioner under my supervision and collaboration.  I have reviewed the Advanced Practitioner's note and chart, and I agree with the management and plan.  Tiauna Whisnant 01/08/2014 8:10 AM   

## 2014-01-09 ENCOUNTER — Encounter: Payer: Managed Care, Other (non HMO) | Admitting: Obstetrics & Gynecology

## 2014-02-12 ENCOUNTER — Ambulatory Visit (INDEPENDENT_AMBULATORY_CARE_PROVIDER_SITE_OTHER): Payer: Managed Care, Other (non HMO) | Admitting: Obstetrics and Gynecology

## 2014-02-12 ENCOUNTER — Encounter: Payer: Self-pay | Admitting: Obstetrics and Gynecology

## 2014-02-12 NOTE — Progress Notes (Signed)
  Subjective:     Julia Levine is a 28 y.o. female who presents for a postpartum visit. She is 5 weeks postpartum following a spontaneous vaginal delivery. I have fully reviewed the prenatal and intrapartum course. The delivery was at 38 gestational weeks. Outcome: spontaneous vaginal delivery. Anesthesia: epidural. Postpartum course has been uncomplicated. Baby's course has been uncomplicated. Baby is feeding by bottle Rush Barer- Gerber Good start. Bleeding no bleeding. Bowel function is normal. Bladder function is normal. Patient is not sexually active. Contraception method is none. Postpartum depression screening: negative.     Review of Systems A comprehensive review of systems was negative.   Objective:    BP 118/78  Pulse 117  Ht 5\' 7"  (1.702 m)  Wt 177 lb (80.287 kg)  BMI 27.72 kg/m2  Breastfeeding? No  General:  alert, cooperative and no distress   Breasts:  inspection negative, no nipple discharge or bleeding, no masses or nodularity palpable  Lungs: clear to auscultation bilaterally  Heart:  regular rate and rhythm  Abdomen: soft, non-tender; bowel sounds normal; no masses,  no organomegaly   Vulva:  normal  Vagina: normal vagina, no discharge, exudate, lesion, or erythema  Cervix:  multiparous appearance  Corpus: normal size, contour, position, consistency, mobility, non-tender  Adnexa:  normal adnexa and no mass, fullness, tenderness  Rectal Exam: Not performed.        Assessment:     Normal postpartum exam. Pap smear not done at today's visit.   Plan:    1. Contraception: IUD 2. Patient is medically cleared to resume all activities of daily living 3. Follow up in: a few days for IUD insertion or as needed.

## 2014-02-20 ENCOUNTER — Ambulatory Visit (INDEPENDENT_AMBULATORY_CARE_PROVIDER_SITE_OTHER): Payer: Managed Care, Other (non HMO) | Admitting: Obstetrics & Gynecology

## 2014-02-20 ENCOUNTER — Encounter: Payer: Self-pay | Admitting: Obstetrics & Gynecology

## 2014-02-20 VITALS — BP 120/82 | HR 111 | Ht 67.0 in | Wt 179.0 lb

## 2014-02-20 DIAGNOSIS — Z01812 Encounter for preprocedural laboratory examination: Secondary | ICD-10-CM

## 2014-02-20 DIAGNOSIS — Z975 Presence of (intrauterine) contraceptive device: Secondary | ICD-10-CM

## 2014-02-20 DIAGNOSIS — Z30430 Encounter for insertion of intrauterine contraceptive device: Secondary | ICD-10-CM

## 2014-02-20 LAB — POCT URINE PREGNANCY: Preg Test, Ur: NEGATIVE

## 2014-02-20 NOTE — Progress Notes (Signed)
   Subjective:    Patient ID: Julia CarasBrittany N Levine, female    DOB: 1985-09-08, 28 y.o.   MRN: [id number]  HPI  This 28 yo MW P2 is here for Mirena insertion. This will be her second Mirena. She had the first removed so that she could conceive. She would like another for contraception now.   Review of Systems She has a h/o LGSIL.    Objective:   Physical Exam  UPT negative, consent signed, Time out procedure done. Cervix prepped with betadine and grasped with a single tooth tenaculum. Mirena was easily placed and the strings were cut to 3-4 cm. Uterus sounded to 9 cm. She tolerated the procedure well.        Assessment & Plan:  Contraception- Mirena Preventative care- RTC 6 months for a pap

## 2014-05-16 IMAGING — US US OB COMP +14 WK
1 series · 12 of 28 positions shown · non-contrast
Comparison: none

[Series 1: us ob comp +14 wk · 12 of 97 slices shown]
[im 4/97]
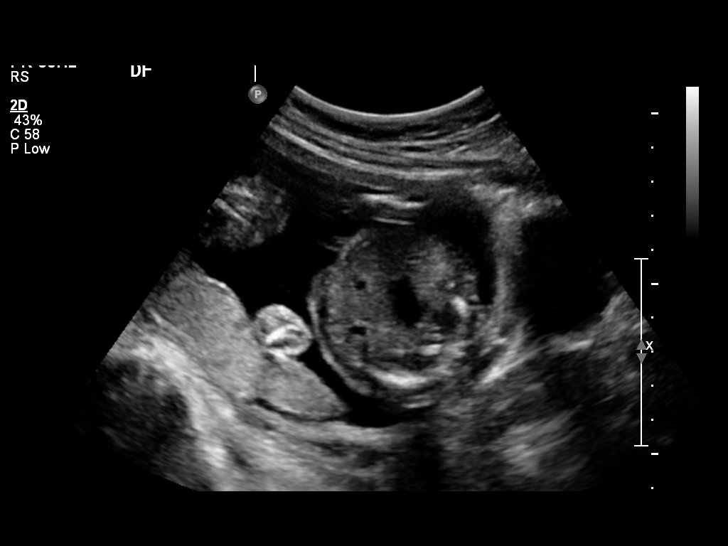
[im 11/97]
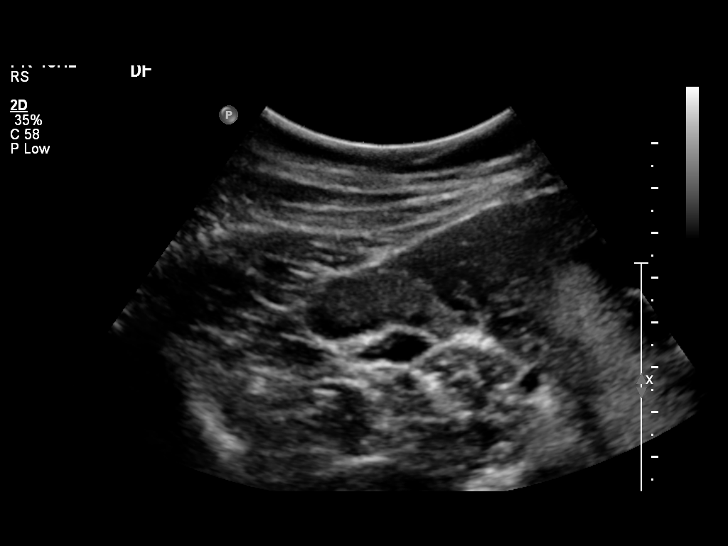
[im 18/97]
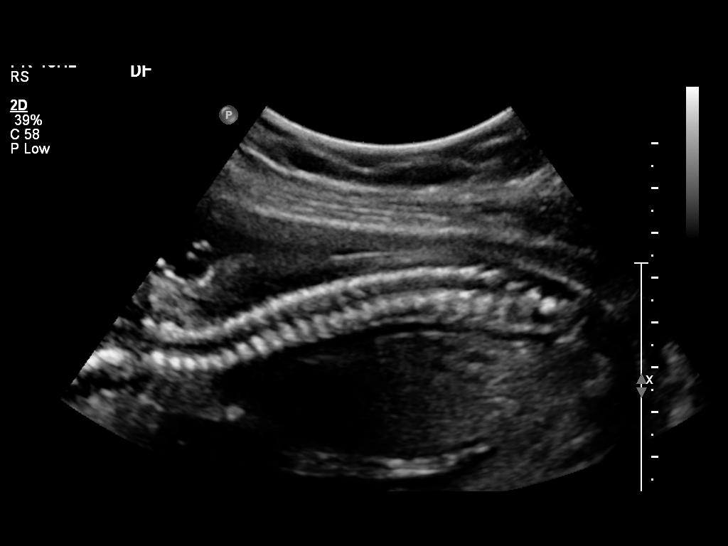
[im 29/97]
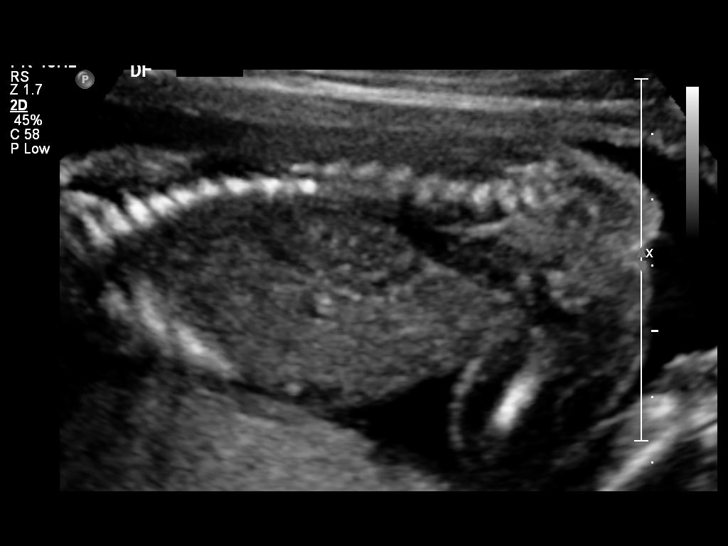
[im 36/97]
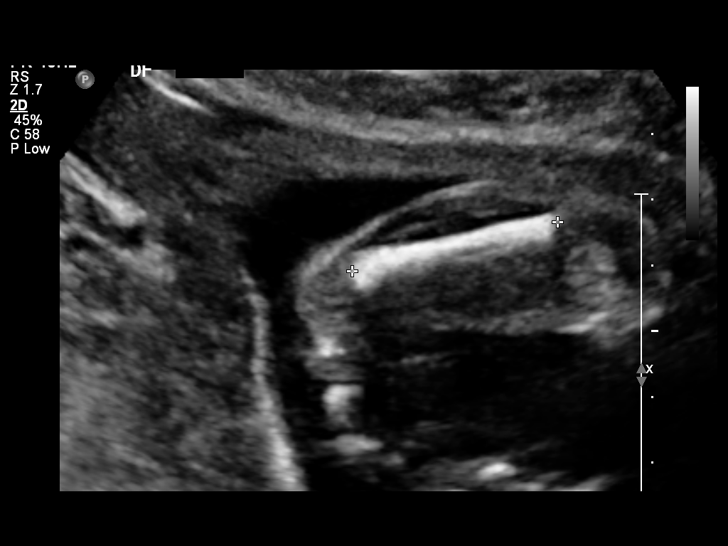
[im 43/97]
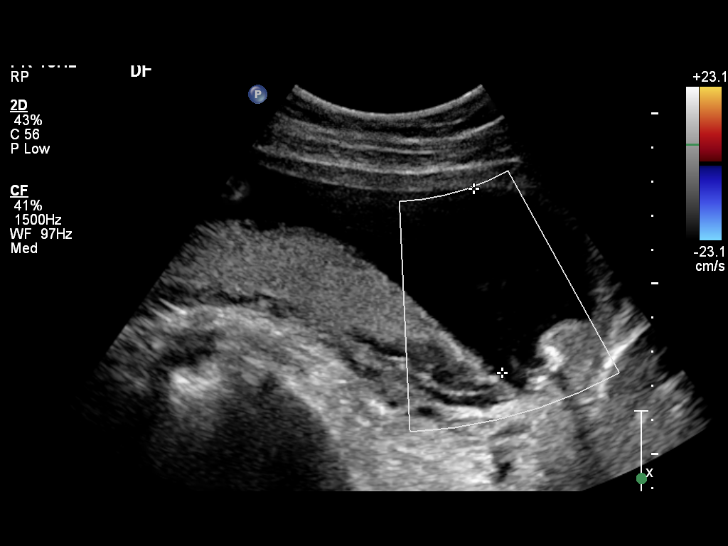
[im 54/97]
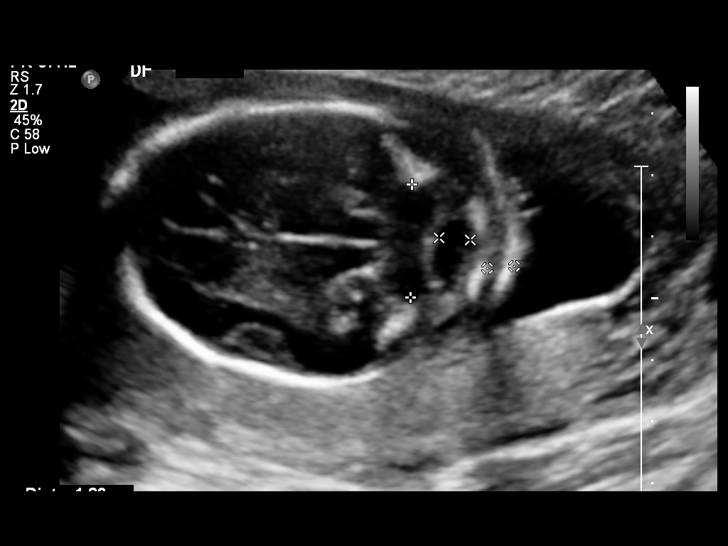
[im 61/97]
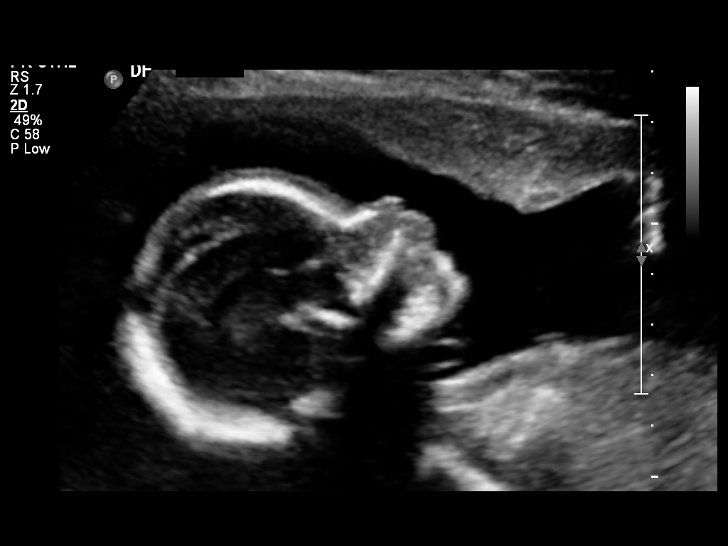
[im 68/97]
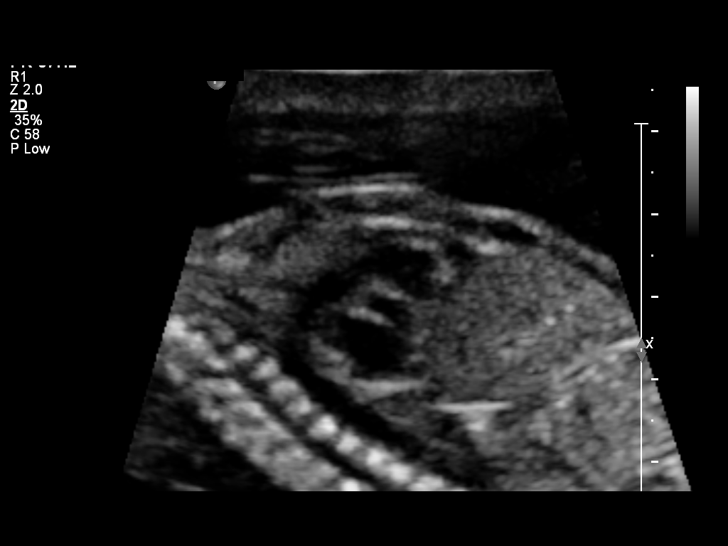
[im 79/97]
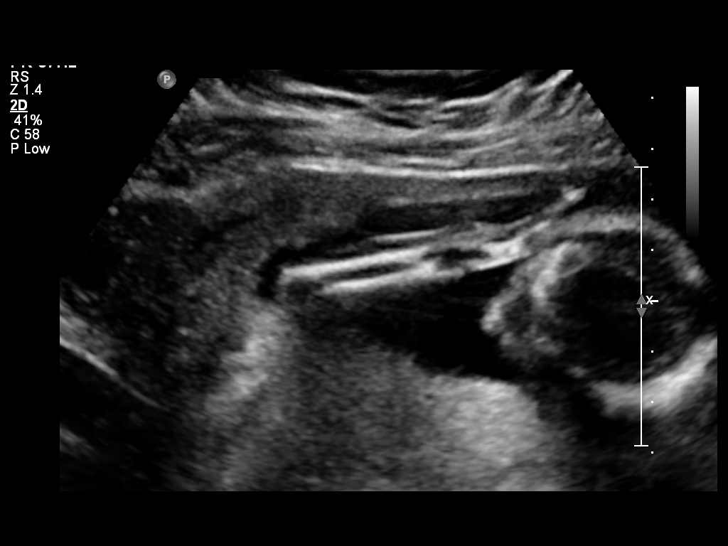
[im 86/97]
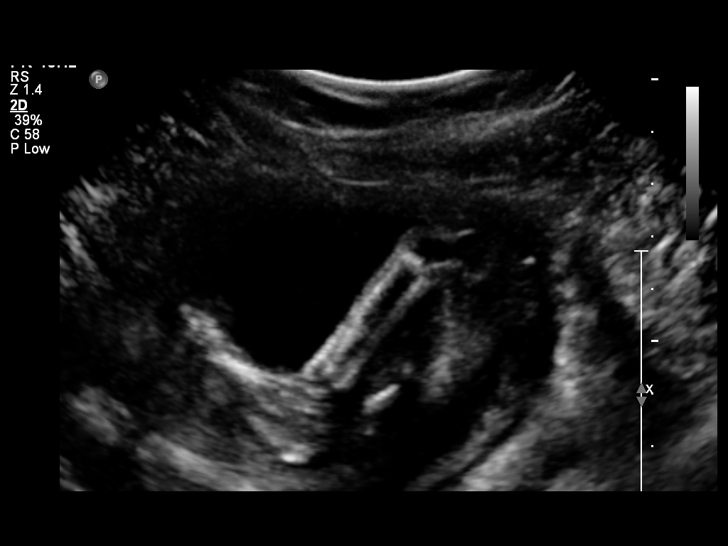
[im 93/97]
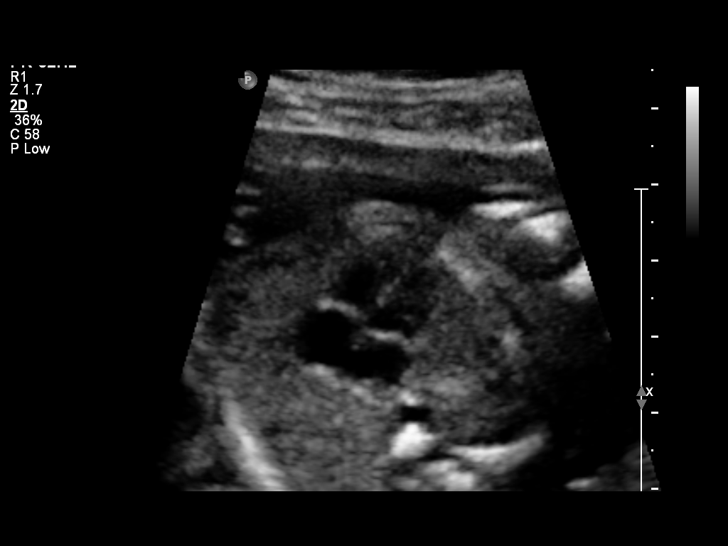

[12 of 28 positions shown; findings below may reference images not displayed]

OBSTETRICS REPORT
                      (Signed Final 08/28/2013 [DATE])

Service(s) Provided

 US OB COMP + 14 WK                                    76805.1
Indications

 Basic anatomic survey
 Poor obstetric history: Previous preterm delivery:
 twins at 61wks; neonatal demises
Fetal Evaluation

 Num Of Fetuses:    1
 Fetal Heart Rate:  143                          bpm
 Cardiac Activity:  Observed
 Presentation:      Breech
 Placenta:          Posterior, above cervical
                    os
 P. Cord            Visualized
 Insertion:

 Amniotic Fluid
 AFI FV:      Subjectively within normal limits
                                             Larg Pckt:     5.4  cm
Biometry

 BPD:     44.5  mm     G. Age:  19w 3d                CI:        64.67   70 - 86
                                                      FL/HC:      17.9   16.8 -

 HC:     177.9  mm     G. Age:  20w 2d       48  %    HC/AC:      1.10   1.09 -

 AC:     161.1  mm     G. Age:  21w 1d       77  %    FL/BPD:
 FL:      31.9  mm     G. Age:  20w 0d       35  %    FL/AC:      19.8   20 - 24

 Est. FW:     361  gm    0 lb 13 oz      54  %
Gestational Age

 LMP:           20w 1d        Date:  04/09/13                 EDD:   01/14/14
 U/S Today:     20w 2d                                        EDD:   01/13/14
 Best:          20w 1d     Det. By:  LMP  (04/09/13)          EDD:   01/14/14
Anatomy

 Cranium:          Appears normal         Aortic Arch:      Appears normal
 Fetal Cavum:      Appears normal         Ductal Arch:      Appears normal
 Ventricles:       Appears normal         Diaphragm:        Not well visualized
 Choroid Plexus:   Appears normal         Stomach:          Appears normal, left
                                                            sided
 Cerebellum:       Appears normal         Abdomen:          Appears normal
 Posterior Fossa:  Appears normal         Abdominal Wall:   Not well visualized
 Nuchal Fold:      Not applicable (>20    Cord Vessels:     Appears normal (3
                   wks GA)                                  vessel cord)
 Face:             Appears normal         Kidneys:          Appear normal
                   (orbits and profile)
 Lips:             Appears normal         Bladder:          Appears normal
 Heart:            Appears normal         Spine:            Appears normal
                   (4CH, axis, and
                   situs)
 RVOT:             Appears normal         Lower             Appears normal
                                          Extremities:
 LVOT:             Appears normal         Upper             Appears normal
                                          Extremities:

 Other:  Heels and 5th digit visualized. Male gender. Technically difficult due
         to fetal position.
Cervix Uterus Adnexa

 Cervical Length:    3.1      cm

 Cervix:       Normal appearance by transabdominal scan.
 Left Ovary:    Within normal limits.
 Right Ovary:   Within normal limits.

 Adnexa:     No abnormality visualized.
Impression

 IUP at 20+1 weeks
 Normal detailed fetal anatomy
 Markers of aneuploidy: none
 Normal amniotic fluid volume
 Measurements consistent with LMP dating
Recommendations

 Consider q 2 week cervical lengths until 28 weeks
 Continue vaginal progesterone

## 2014-06-04 ENCOUNTER — Encounter: Payer: Self-pay | Admitting: Obstetrics & Gynecology

## 2014-06-13 IMAGING — US US OB TRANSVAGINAL
1 series · 8 of 8 positions shown · non-contrast
Comparison: none

[Series 1: us ob transvaginal · 8 acquisitions, 8 frames shown]
[im 1/8]
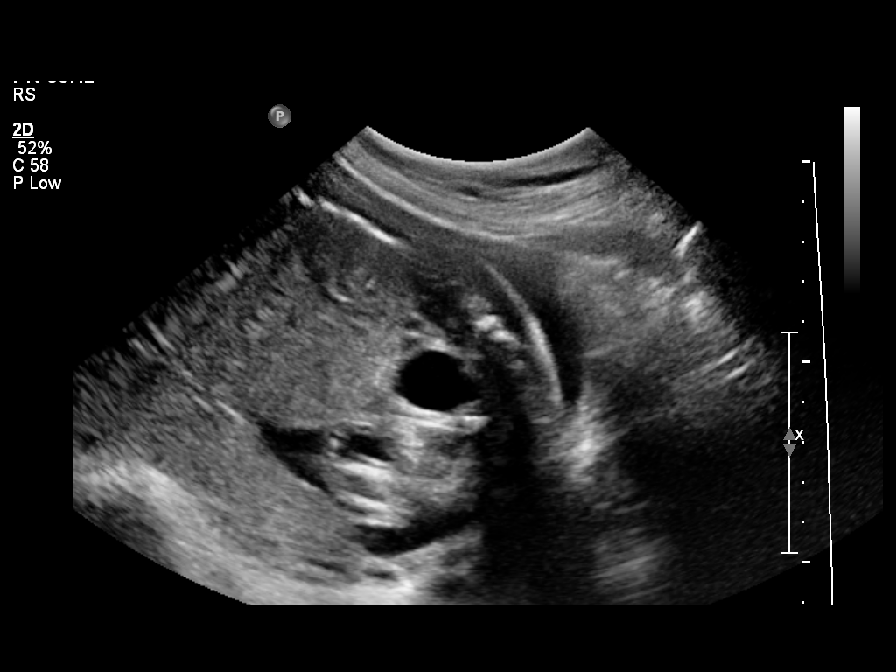
[im 2/8]
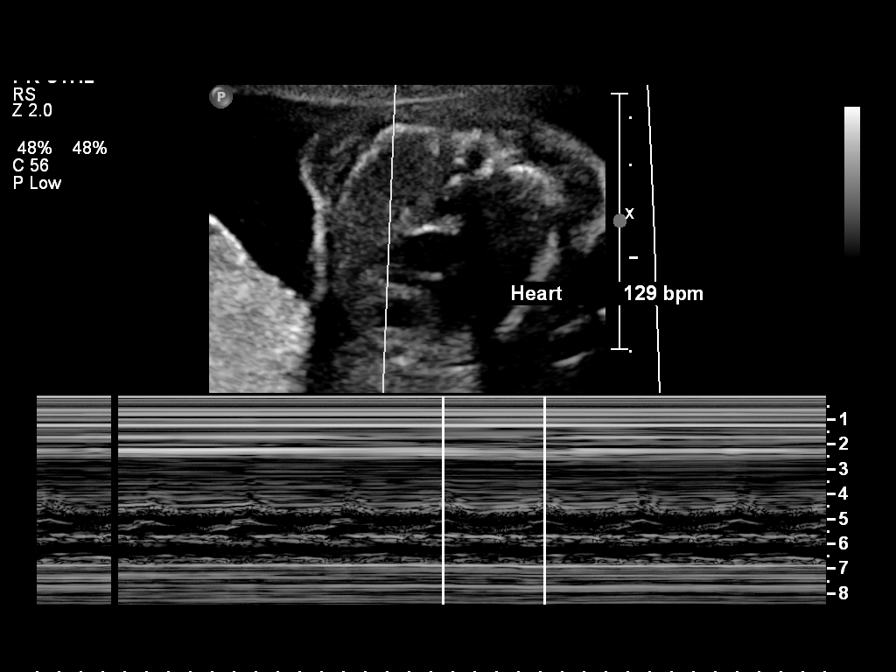
[im 3/8]
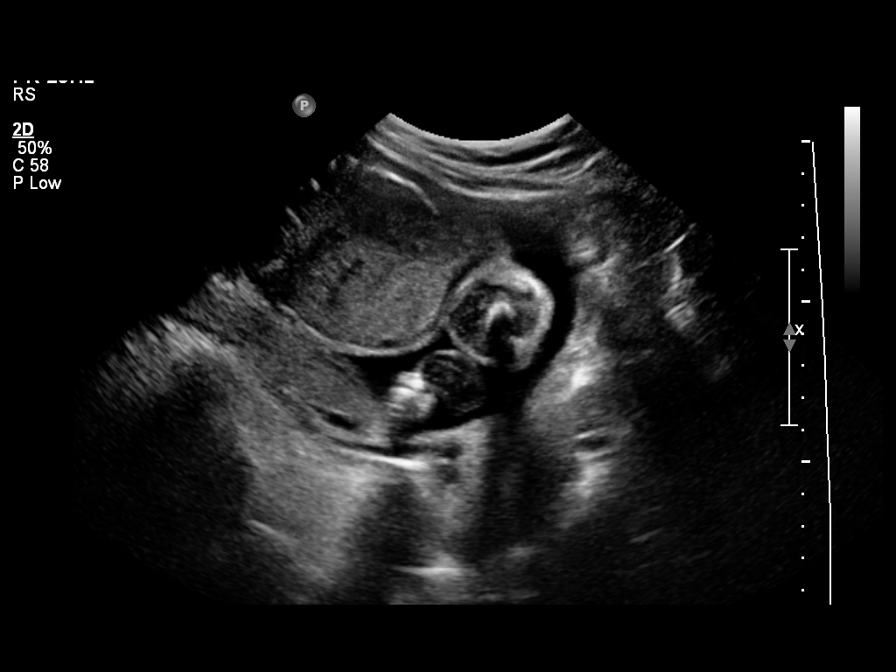
[im 4/8]
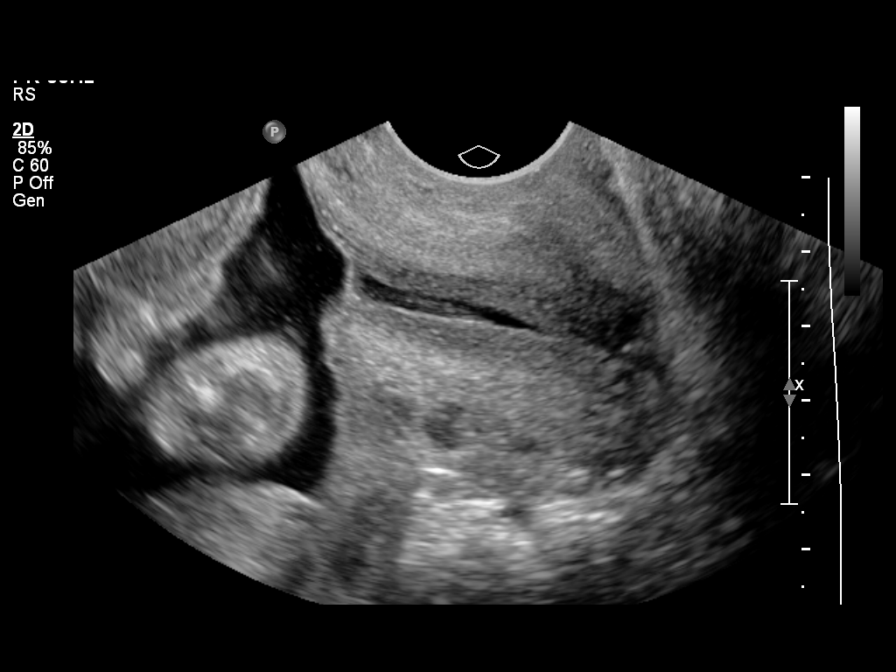
[im 5/8]
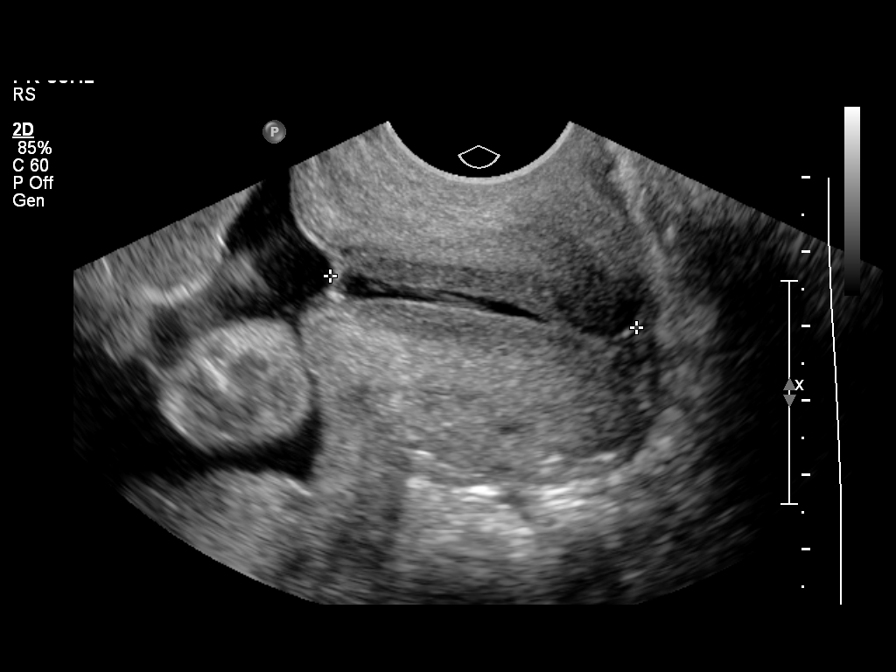
[im 6/8]
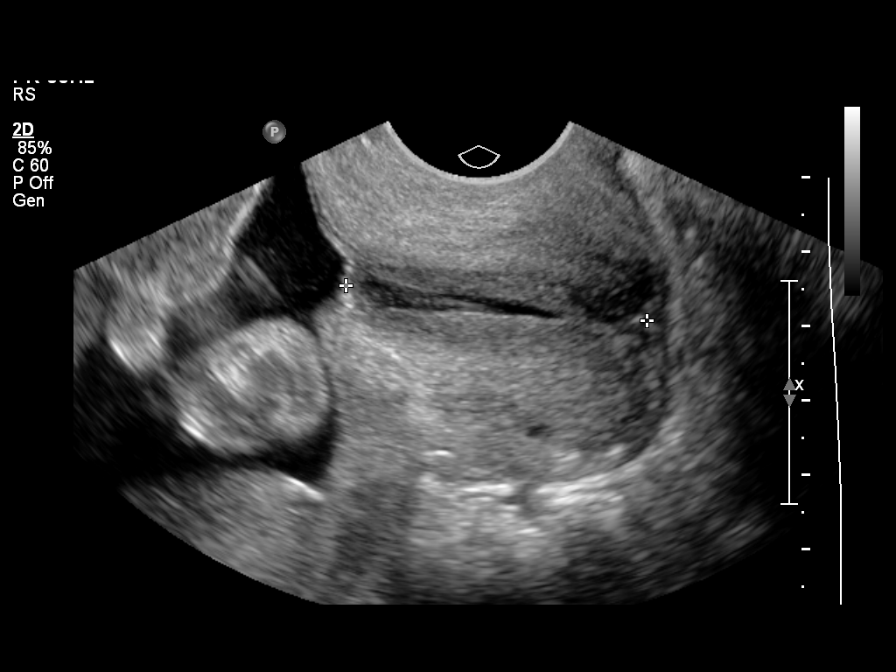
[im 7/8]
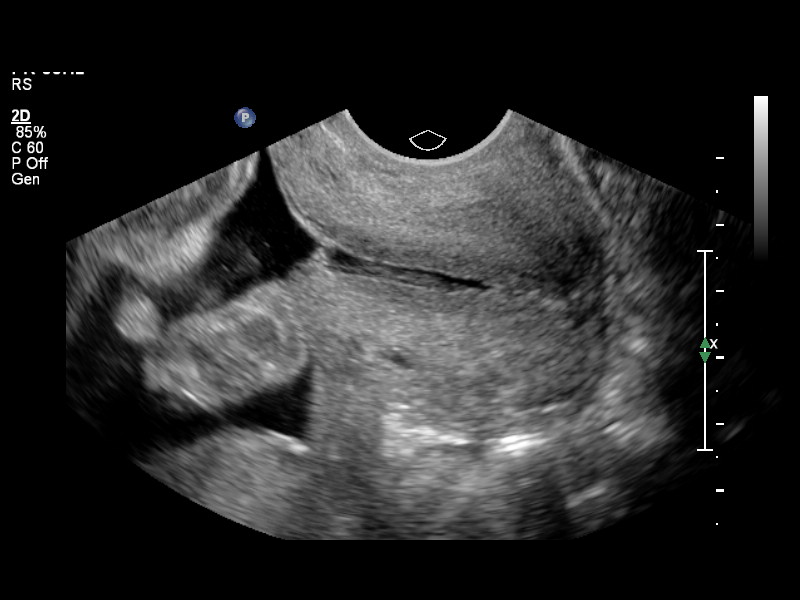
[im 8/8]
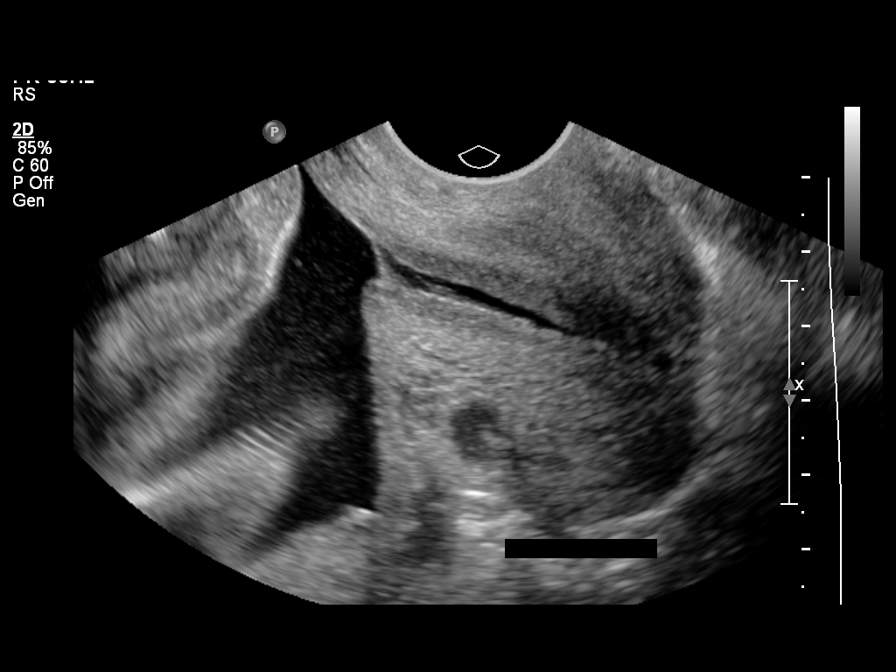

[8 of 8 positions shown; findings below may reference images not displayed]

OBSTETRICS REPORT
                      (Signed Final 09/25/2013 [DATE])

Service(s) Provided

 US OB TRANSVAGINAL                                    76817.0
Indications

 Poor obstetric history: Previous midtrimester loss
 (add cerv insuff if applicable)
 Cervical insufficiency
Fetal Evaluation

 Num Of Fetuses:    1
 Fetal Heart Rate:  129                          bpm
 Cardiac Activity:  Observed
 Presentation:      Breech
Gestational Age

 LMP:           24w 1d        Date:  04/09/13                 EDD:   01/14/14
 Best:          24w 1d     Det. By:  LMP  (04/09/13)          EDD:   01/14/14
Cervix Uterus Adnexa

 Cervical Length:    4.12      cm

 Cervix:       Normal appearance by transvaginal scan
Impression

 IUP at  24+1 weeks
 EV views of cervix: normal length without funneling
Recommendations

 Continue q 2 week CLs at 26 and 28 weeks

## 2014-07-11 IMAGING — US US OB FOLLOW-UP
1 series · 12 of 28 positions shown · non-contrast
Comparison: none

[Series 1: us ob follow up · 48 acquisitions, 12 frames shown]
[im 2/48]
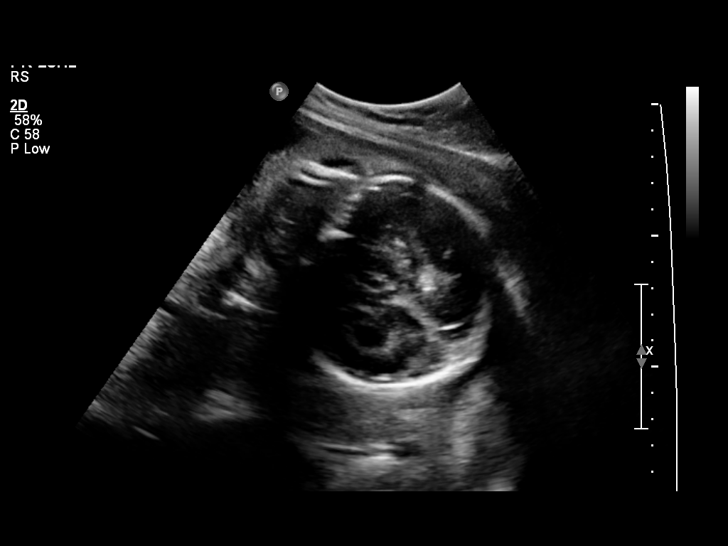
[im 6/48]
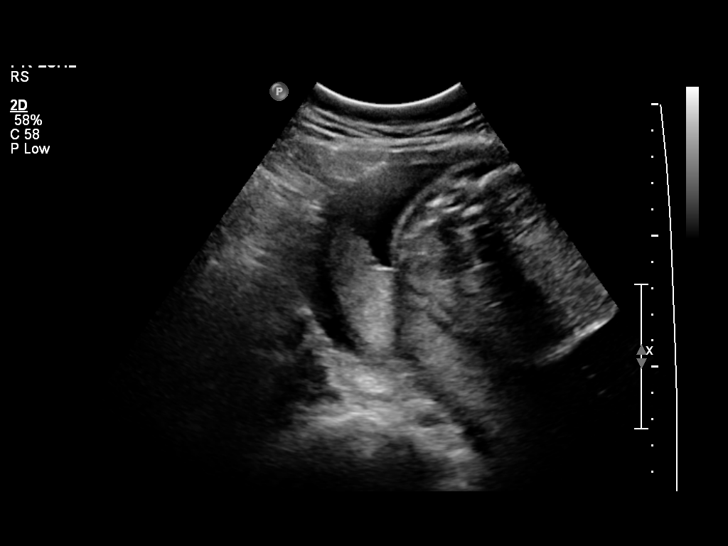
[im 9/48]
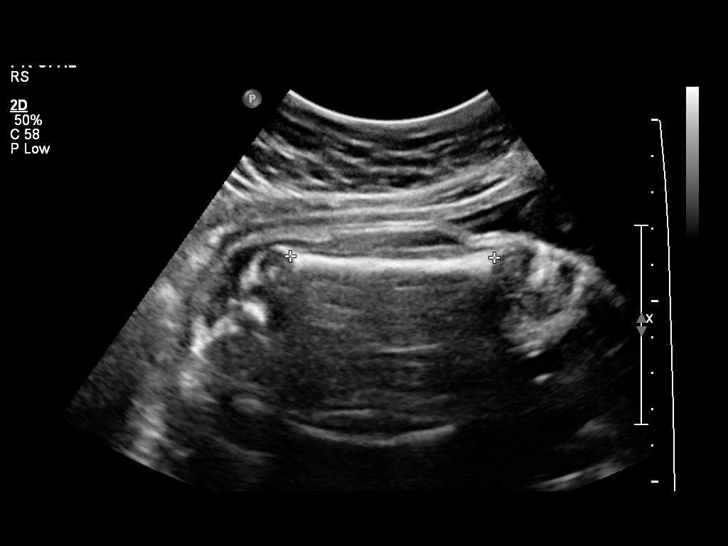
[im 14/48]
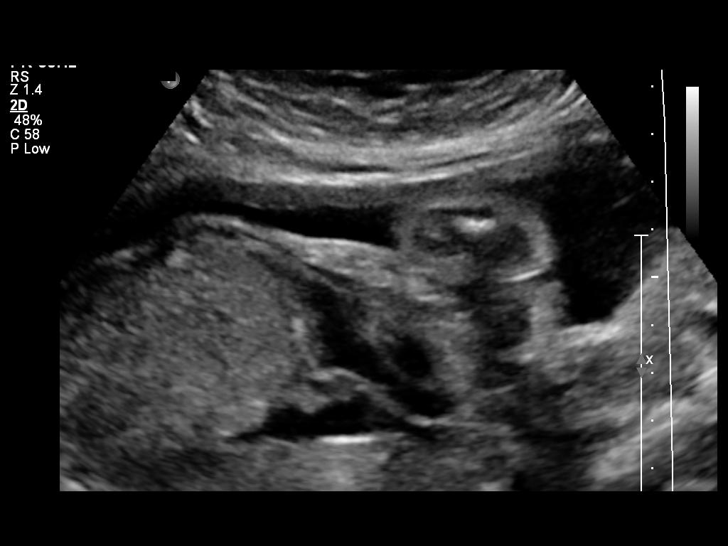
[im 18/48]
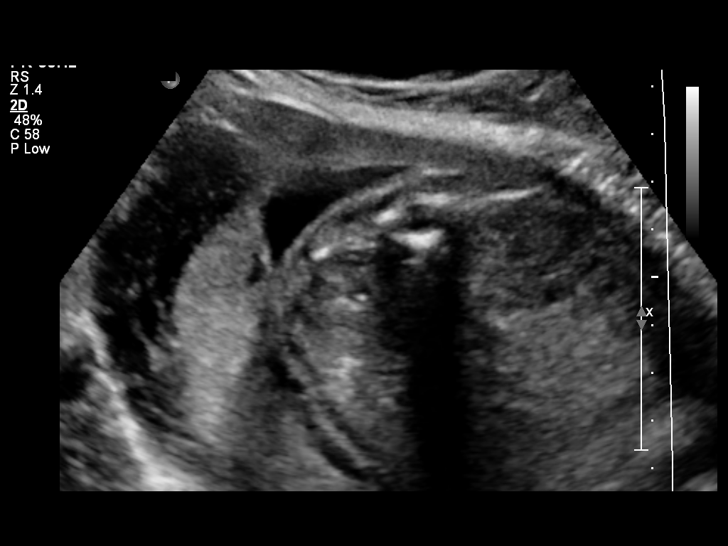
[im 21/48]
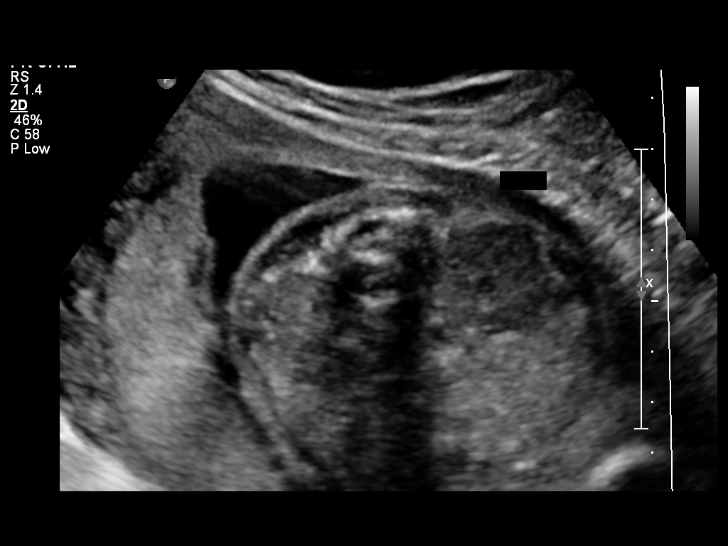
[im 27/48]
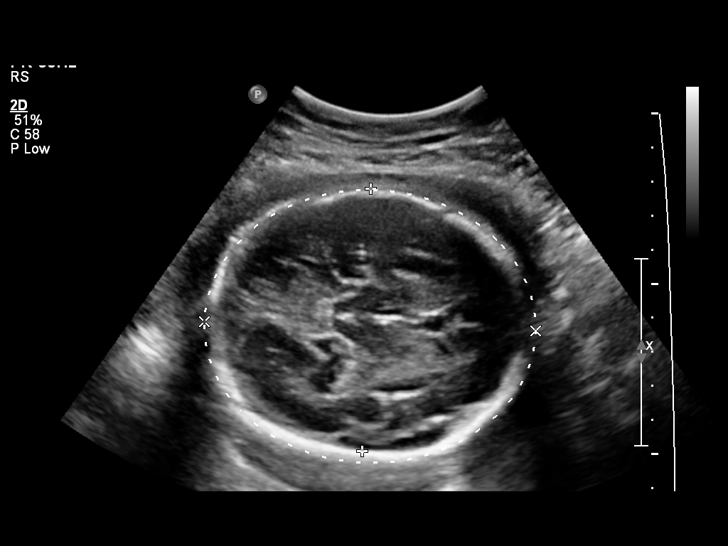
[im 30/48]
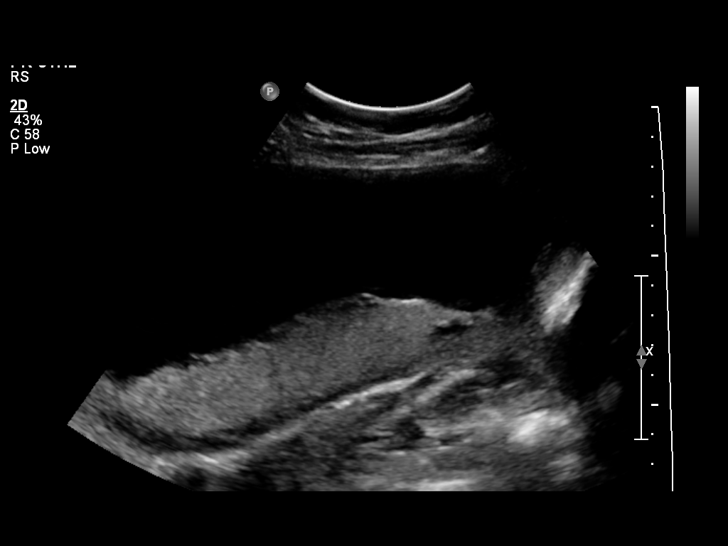
[im 34/48]
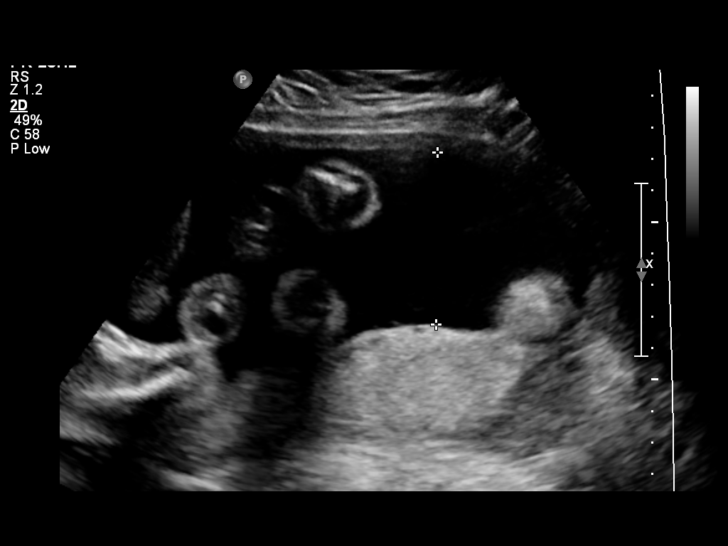
[im 39/48]
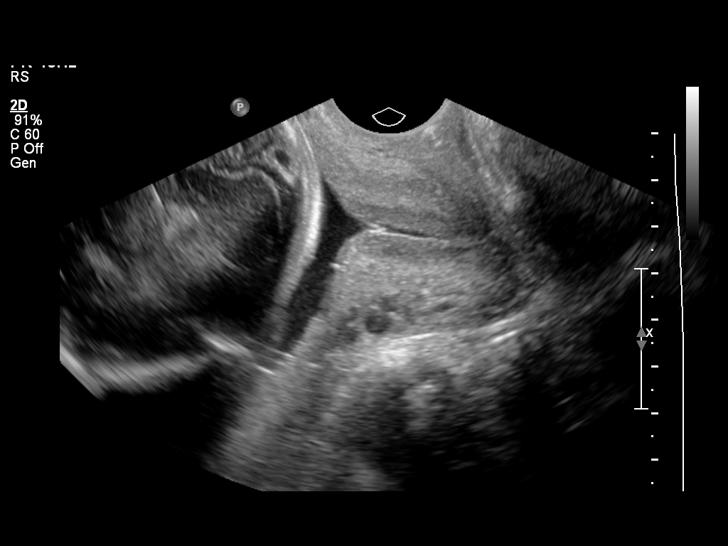
[im 42/48]
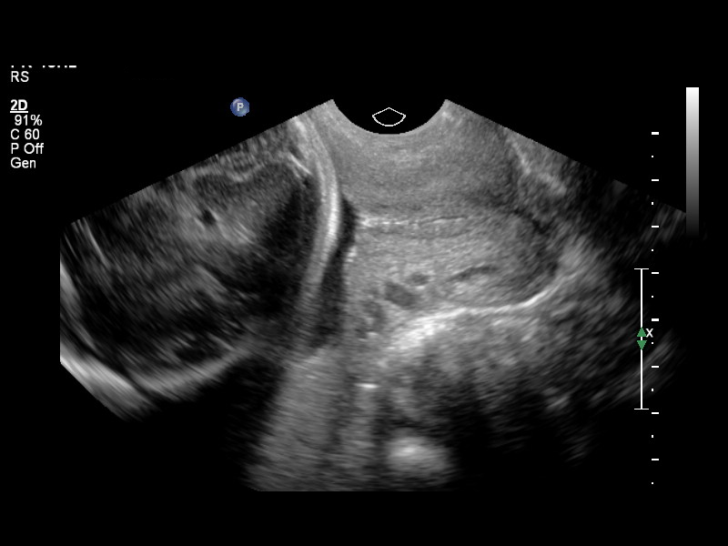
[im 46/48]
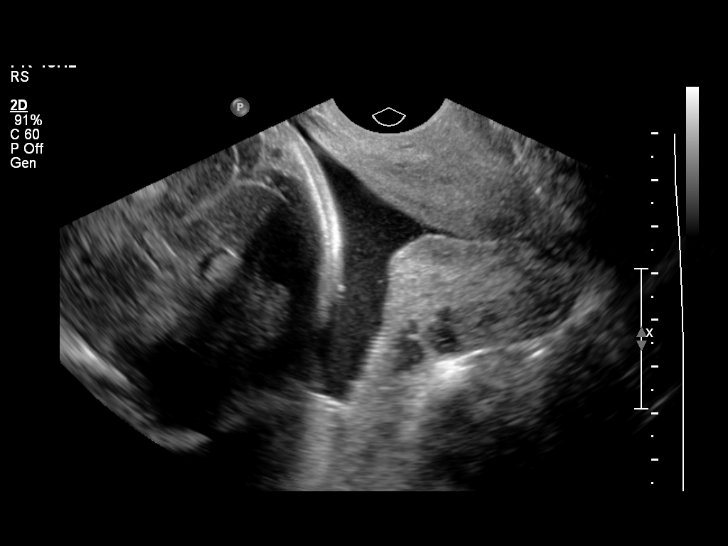

[12 of 28 positions shown; findings below may reference images not displayed]

OBSTETRICS REPORT
                    (Corrected Final 10/24/2013 [DATE])

Service(s) Provided

 US OB FOLLOW UP                                       76816.1
 US MFM OB TRANSVAGINAL                                76817.2
Indications

 Cervical insufficiency
Fetal Evaluation

 Num Of Fetuses:    1
 Fetal Heart Rate:  139                          bpm
 Cardiac Activity:  Observed
 Presentation:      Cephalic
 Placenta:          Posterior, above cervical
                    os
 P. Cord            Previously Visualized
 Insertion:

 Amniotic Fluid
 AFI FV:      Subjectively within normal limits
 AFI Sum:     18.98   cm       74  %Tile     Larg Pckt:    5.44  cm
 RUQ:   4.16    cm   RLQ:    4.41   cm    LUQ:   4.97    cm   LLQ:    5.44   cm
Biometry

 BPD:     76.1  mm     G. Age:  30w 4d                CI:        75.61   70 - 86
                                                      FL/HC:      20.2   18.8 -

 HC:     277.5  mm     G. Age:  30w 2d       83  %    HC/AC:      1.13   1.05 -

 AC:     246.5  mm     G. Age:  28w 6d       65  %    FL/BPD:     73.6   71 - 87
 FL:        56  mm     G. Age:  29w 4d       73  %    FL/AC:      22.7   20 - 24

 Est. FW:    1514  gm      3 lb 1 oz     75  %
Gestational Age

 LMP:           28w 1d        Date:  04/09/13                 EDD:   01/14/14
 U/S Today:     29w 6d                                        EDD:   01/02/14
 Best:          28w 1d     Det. By:  LMP  (04/09/13)          EDD:   01/14/14
Anatomy

 Cranium:          Appears normal         Aortic Arch:      Previously seen
 Fetal Cavum:      Previously seen        Ductal Arch:      Previously seen
 Ventricles:       Appears normal         Diaphragm:        Appears normal
 Choroid Plexus:   Previously seen        Stomach:          Appears normal, left
                                                            sided
 Cerebellum:       Previously seen        Abdomen:          Appears normal
 Posterior Fossa:  Previously seen        Abdominal Wall:   Appears nml (cord
                                                            insert, abd wall)
 Nuchal Fold:      Not applicable (>20    Cord Vessels:     Previously seen
                   wks GA)
 Face:             Orbits and profile     Kidneys:          Appear normal
                   previously seen
 Lips:             Previously seen        Bladder:          Appears normal
 Heart:            Appears normal         Spine:            Previously seen
                   (4CH, axis, and
                   situs)
 RVOT:             Appears normal         Lower             Previously seen
                                          Extremities:
 LVOT:             Appears normal         Upper             Previously seen
                                          Extremities:

 Other:  Heels and 5th digit previously visualized. Male gender. Technically
         difficult due to fetal position.
Cervix Uterus Adnexa

 Cervical Length:    2.6      cm

 Cervix:       Appears funnelled

 Adnexa:     No abnormality visualized.
Impression

 Active SIUP at 75w8d
 EFW 75th%
 No dysmorphic features
 Endovaginal imaging demonstrates a closed cervix with a
 functional length of 2.6cm
Recommendations

 Given gestational age, today's images are consistent with a
 cervical length appropriate for gestational age.  Follow up
 ultrasounds may be arranged if clinically indicated.

                 Attending Physician, HARIC

## 2014-11-08 ENCOUNTER — Encounter: Payer: Self-pay | Admitting: Family Medicine

## 2014-11-08 ENCOUNTER — Ambulatory Visit (INDEPENDENT_AMBULATORY_CARE_PROVIDER_SITE_OTHER): Payer: 59 | Admitting: Family Medicine

## 2014-11-08 VITALS — BP 111/69 | HR 108 | Ht 67.0 in | Wt 161.0 lb

## 2014-11-08 DIAGNOSIS — Z01419 Encounter for gynecological examination (general) (routine) without abnormal findings: Secondary | ICD-10-CM

## 2014-11-08 DIAGNOSIS — Z113 Encounter for screening for infections with a predominantly sexual mode of transmission: Secondary | ICD-10-CM | POA: Diagnosis not present

## 2014-11-08 DIAGNOSIS — Z124 Encounter for screening for malignant neoplasm of cervix: Secondary | ICD-10-CM

## 2014-11-08 NOTE — Patient Instructions (Signed)
Preventive Care for Adults A healthy lifestyle and preventive care can promote health and wellness. Preventive health guidelines for women include the following key practices.  A routine yearly physical is a good way to check with your health care provider about your health and preventive screening. It is a chance to share any concerns and updates on your health and to receive a thorough exam.  Visit your dentist for a routine exam and preventive care every 6 months. Brush your teeth twice a day and floss once a day. Good oral hygiene prevents tooth decay and gum disease.  The frequency of eye exams is based on your age, health, family medical history, use of contact lenses, and other factors. Follow your health care provider's recommendations for frequency of eye exams.  Eat a healthy diet. Foods like vegetables, fruits, whole grains, low-fat dairy products, and lean protein foods contain the nutrients you need without too many calories. Decrease your intake of foods high in solid fats, added sugars, and salt. Eat the right amount of calories for you.Get information about a proper diet from your health care provider, if necessary.  Regular physical exercise is one of the most important things you can do for your health. Most adults should get at least 150 minutes of moderate-intensity exercise (any activity that increases your heart rate and causes you to sweat) each week. In addition, most adults need muscle-strengthening exercises on 2 or more days a week.  Maintain a healthy weight. The body mass index (BMI) is a screening tool to identify possible weight problems. It provides an estimate of body fat based on height and weight. Your health care provider can find your BMI and can help you achieve or maintain a healthy weight.For adults 20 years and older:  A BMI below 18.5 is considered underweight.  A BMI of 18.5 to 24.9 is normal.  A BMI of 25 to 29.9 is considered overweight.  A BMI of  30 and above is considered obese.  Maintain normal blood lipids and cholesterol levels by exercising and minimizing your intake of saturated fat. Eat a balanced diet with plenty of fruit and vegetables. Blood tests for lipids and cholesterol should begin at age 76 and be repeated every 5 years. If your lipid or cholesterol levels are high, you are over 50, or you are at high risk for heart disease, you may need your cholesterol levels checked more frequently.Ongoing high lipid and cholesterol levels should be treated with medicines if diet and exercise are not working.  If you smoke, find out from your health care provider how to quit. If you do not use tobacco, do not start.  Lung cancer screening is recommended for adults aged 22-80 years who are at high risk for developing lung cancer because of a history of smoking. A yearly low-dose CT scan of the lungs is recommended for people who have at least a 30-pack-year history of smoking and are a current smoker or have quit within the past 15 years. A pack year of smoking is smoking an average of 1 pack of cigarettes a day for 1 year (for example: 1 pack a day for 30 years or 2 packs a day for 15 years). Yearly screening should continue until the smoker has stopped smoking for at least 15 years. Yearly screening should be stopped for people who develop a health problem that would prevent them from having lung cancer treatment.  If you are pregnant, do not drink alcohol. If you are breastfeeding,  be very cautious about drinking alcohol. If you are not pregnant and choose to drink alcohol, do not have more than 1 drink per day. One drink is considered to be 12 ounces (355 mL) of beer, 5 ounces (148 mL) of wine, or 1.5 ounces (44 mL) of liquor.  Avoid use of street drugs. Do not share needles with anyone. Ask for help if you need support or instructions about stopping the use of drugs.  High blood pressure causes heart disease and increases the risk of  stroke. Your blood pressure should be checked at least every 1 to 2 years. Ongoing high blood pressure should be treated with medicines if weight loss and exercise do not work.  If you are 75-52 years old, ask your health care provider if you should take aspirin to prevent strokes.  Diabetes screening involves taking a blood sample to check your fasting blood sugar level. This should be done once every 3 years, after age 15, if you are within normal weight and without risk factors for diabetes. Testing should be considered at a younger age or be carried out more frequently if you are overweight and have at least 1 risk factor for diabetes.  Breast cancer screening is essential preventive care for women. You should practice "breast self-awareness." This means understanding the normal appearance and feel of your breasts and may include breast self-examination. Any changes detected, no matter how small, should be reported to a health care provider. Women in their 58s and 30s should have a clinical breast exam (CBE) by a health care provider as part of a regular health exam every 1 to 3 years. After age 16, women should have a CBE every year. Starting at age 53, women should consider having a mammogram (breast X-ray test) every year. Women who have a family history of breast cancer should talk to their health care provider about genetic screening. Women at a high risk of breast cancer should talk to their health care providers about having an MRI and a mammogram every year.  Breast cancer gene (BRCA)-related cancer risk assessment is recommended for women who have family members with BRCA-related cancers. BRCA-related cancers include breast, ovarian, tubal, and peritoneal cancers. Having family members with these cancers may be associated with an increased risk for harmful changes (mutations) in the breast cancer genes BRCA1 and BRCA2. Results of the assessment will determine the need for genetic counseling and  BRCA1 and BRCA2 testing.  Routine pelvic exams to screen for cancer are no longer recommended for nonpregnant women who are considered low risk for cancer of the pelvic organs (ovaries, uterus, and vagina) and who do not have symptoms. Ask your health care provider if a screening pelvic exam is right for you.  If you have had past treatment for cervical cancer or a condition that could lead to cancer, you need Pap tests and screening for cancer for at least 20 years after your treatment. If Pap tests have been discontinued, your risk factors (such as having a new sexual partner) need to be reassessed to determine if screening should be resumed. Some women have medical problems that increase the chance of getting cervical cancer. In these cases, your health care provider may recommend more frequent screening and Pap tests.  The HPV test is an additional test that may be used for cervical cancer screening. The HPV test looks for the virus that can cause the cell changes on the cervix. The cells collected during the Pap test can be  tested for HPV. The HPV test could be used to screen women aged 30 years and older, and should be used in women of any age who have unclear Pap test results. After the age of 30, women should have HPV testing at the same frequency as a Pap test.  Colorectal cancer can be detected and often prevented. Most routine colorectal cancer screening begins at the age of 50 years and continues through age 75 years. However, your health care provider may recommend screening at an earlier age if you have risk factors for colon cancer. On a yearly basis, your health care provider may provide home test kits to check for hidden blood in the stool. Use of a small camera at the end of a tube, to directly examine the colon (sigmoidoscopy or colonoscopy), can detect the earliest forms of colorectal cancer. Talk to your health care provider about this at age 50, when routine screening begins. Direct  exam of the colon should be repeated every 5-10 years through age 75 years, unless early forms of pre-cancerous polyps or small growths are found.  People who are at an increased risk for hepatitis B should be screened for this virus. You are considered at high risk for hepatitis B if:  You were born in a country where hepatitis B occurs often. Talk with your health care provider about which countries are considered high risk.  Your parents were born in a high-risk country and you have not received a shot to protect against hepatitis B (hepatitis B vaccine).  You have HIV or AIDS.  You use needles to inject street drugs.  You live with, or have sex with, someone who has hepatitis B.  You get hemodialysis treatment.  You take certain medicines for conditions like cancer, organ transplantation, and autoimmune conditions.  Hepatitis C blood testing is recommended for all people born from 1945 through 1965 and any individual with known risks for hepatitis C.  Practice safe sex. Use condoms and avoid high-risk sexual practices to reduce the spread of sexually transmitted infections (STIs). STIs include gonorrhea, chlamydia, syphilis, trichomonas, herpes, HPV, and human immunodeficiency virus (HIV). Herpes, HIV, and HPV are viral illnesses that have no cure. They can result in disability, cancer, and death.  You should be screened for sexually transmitted illnesses (STIs) including gonorrhea and chlamydia if:  You are sexually active and are younger than 24 years.  You are older than 24 years and your health care provider tells you that you are at risk for this type of infection.  Your sexual activity has changed since you were last screened and you are at an increased risk for chlamydia or gonorrhea. Ask your health care provider if you are at risk.  If you are at risk of being infected with HIV, it is recommended that you take a prescription medicine daily to prevent HIV infection. This is  called preexposure prophylaxis (PrEP). You are considered at risk if:  You are a heterosexual woman, are sexually active, and are at increased risk for HIV infection.  You take drugs by injection.  You are sexually active with a partner who has HIV.  Talk with your health care provider about whether you are at high risk of being infected with HIV. If you choose to begin PrEP, you should first be tested for HIV. You should then be tested every 3 months for as long as you are taking PrEP.  Osteoporosis is a disease in which the bones lose minerals and strength   with aging. This can result in serious bone fractures or breaks. The risk of osteoporosis can be identified using a bone density scan. Women ages 65 years and over and women at risk for fractures or osteoporosis should discuss screening with their health care providers. Ask your health care provider whether you should take a calcium supplement or vitamin D to reduce the rate of osteoporosis.  Menopause can be associated with physical symptoms and risks. Hormone replacement therapy is available to decrease symptoms and risks. You should talk to your health care provider about whether hormone replacement therapy is right for you.  Use sunscreen. Apply sunscreen liberally and repeatedly throughout the day. You should seek shade when your shadow is shorter than you. Protect yourself by wearing long sleeves, pants, a wide-brimmed hat, and sunglasses year round, whenever you are outdoors.  Once a month, do a whole body skin exam, using a mirror to look at the skin on your back. Tell your health care provider of new moles, moles that have irregular borders, moles that are larger than a pencil eraser, or moles that have changed in shape or color.  Stay current with required vaccines (immunizations).  Influenza vaccine. All adults should be immunized every year.  Tetanus, diphtheria, and acellular pertussis (Td, Tdap) vaccine. Pregnant women should  receive 1 dose of Tdap vaccine during each pregnancy. The dose should be obtained regardless of the length of time since the last dose. Immunization is preferred during the 27th-36th week of gestation. An adult who has not previously received Tdap or who does not know her vaccine status should receive 1 dose of Tdap. This initial dose should be followed by tetanus and diphtheria toxoids (Td) booster doses every 10 years. Adults with an unknown or incomplete history of completing a 3-dose immunization series with Td-containing vaccines should begin or complete a primary immunization series including a Tdap dose. Adults should receive a Td booster every 10 years.  Varicella vaccine. An adult without evidence of immunity to varicella should receive 2 doses or a second dose if she has previously received 1 dose. Pregnant females who do not have evidence of immunity should receive the first dose after pregnancy. This first dose should be obtained before leaving the health care facility. The second dose should be obtained 4-8 weeks after the first dose.  Human papillomavirus (HPV) vaccine. Females aged 13-26 years who have not received the vaccine previously should obtain the 3-dose series. The vaccine is not recommended for use in pregnant females. However, pregnancy testing is not needed before receiving a dose. If a female is found to be pregnant after receiving a dose, no treatment is needed. In that case, the remaining doses should be delayed until after the pregnancy. Immunization is recommended for any person with an immunocompromised condition through the age of 26 years if she did not get any or all doses earlier. During the 3-dose series, the second dose should be obtained 4-8 weeks after the first dose. The third dose should be obtained 24 weeks after the first dose and 16 weeks after the second dose.  Zoster vaccine. One dose is recommended for adults aged 60 years or older unless certain conditions are  present.  Measles, mumps, and rubella (MMR) vaccine. Adults born before 1957 generally are considered immune to measles and mumps. Adults born in 1957 or later should have 1 or more doses of MMR vaccine unless there is a contraindication to the vaccine or there is laboratory evidence of immunity to   each of the three diseases. A routine second dose of MMR vaccine should be obtained at least 28 days after the first dose for students attending postsecondary schools, health care workers, or international travelers. People who received inactivated measles vaccine or an unknown type of measles vaccine during 1963-1967 should receive 2 doses of MMR vaccine. People who received inactivated mumps vaccine or an unknown type of mumps vaccine before 1979 and are at high risk for mumps infection should consider immunization with 2 doses of MMR vaccine. For females of childbearing age, rubella immunity should be determined. If there is no evidence of immunity, females who are not pregnant should be vaccinated. If there is no evidence of immunity, females who are pregnant should delay immunization until after pregnancy. Unvaccinated health care workers born before 1957 who lack laboratory evidence of measles, mumps, or rubella immunity or laboratory confirmation of disease should consider measles and mumps immunization with 2 doses of MMR vaccine or rubella immunization with 1 dose of MMR vaccine.  Pneumococcal 13-valent conjugate (PCV13) vaccine. When indicated, a person who is uncertain of her immunization history and has no record of immunization should receive the PCV13 vaccine. An adult aged 19 years or older who has certain medical conditions and has not been previously immunized should receive 1 dose of PCV13 vaccine. This PCV13 should be followed with a dose of pneumococcal polysaccharide (PPSV23) vaccine. The PPSV23 vaccine dose should be obtained at least 8 weeks after the dose of PCV13 vaccine. An adult aged 19  years or older who has certain medical conditions and previously received 1 or more doses of PPSV23 vaccine should receive 1 dose of PCV13. The PCV13 vaccine dose should be obtained 1 or more years after the last PPSV23 vaccine dose.  Pneumococcal polysaccharide (PPSV23) vaccine. When PCV13 is also indicated, PCV13 should be obtained first. All adults aged 65 years and older should be immunized. An adult younger than age 65 years who has certain medical conditions should be immunized. Any person who resides in a nursing home or long-term care facility should be immunized. An adult smoker should be immunized. People with an immunocompromised condition and certain other conditions should receive both PCV13 and PPSV23 vaccines. People with human immunodeficiency virus (HIV) infection should be immunized as soon as possible after diagnosis. Immunization during chemotherapy or radiation therapy should be avoided. Routine use of PPSV23 vaccine is not recommended for American Indians, [place] Natives, or people younger than 65 years unless there are medical conditions that require PPSV23 vaccine. When indicated, people who have unknown immunization and have no record of immunization should receive PPSV23 vaccine. One-time revaccination 5 years after the first dose of PPSV23 is recommended for people aged 19-64 years who have chronic kidney failure, nephrotic syndrome, asplenia, or immunocompromised conditions. People who received 1-2 doses of PPSV23 before age 65 years should receive another dose of PPSV23 vaccine at age 65 years or later if at least 5 years have passed since the previous dose. Doses of PPSV23 are not needed for people immunized with PPSV23 at or after age 65 years.  Meningococcal vaccine. Adults with asplenia or persistent complement component deficiencies should receive 2 doses of quadrivalent meningococcal conjugate (MenACWY-D) vaccine. The doses should be obtained at least 2 months apart.  Microbiologists working with certain meningococcal bacteria, military recruits, people at risk during an outbreak, and people who travel to or live in countries with a high rate of meningitis should be immunized. A first-year college student up through age   21 years who is living in a residence hall should receive a dose if she did not receive a dose on or after her 16th birthday. Adults who have certain high-risk conditions should receive one or more doses of vaccine.  Hepatitis A vaccine. Adults who wish to be protected from this disease, have certain high-risk conditions, work with hepatitis A-infected animals, work in hepatitis A research labs, or travel to or work in countries with a high rate of hepatitis A should be immunized. Adults who were previously unvaccinated and who anticipate close contact with an international adoptee during the first 60 days after arrival in the [place] from a country with a high rate of hepatitis A should be immunized.  Hepatitis B vaccine. Adults who wish to be protected from this disease, have certain high-risk conditions, may be exposed to blood or other infectious body fluids, are household contacts or sex partners of hepatitis B positive people, are clients or workers in certain care facilities, or travel to or work in countries with a high rate of hepatitis B should be immunized.  Haemophilus influenzae type b (Hib) vaccine. A previously unvaccinated person with asplenia or sickle cell disease or having a scheduled splenectomy should receive 1 dose of Hib vaccine. Regardless of previous immunization, a recipient of a hematopoietic stem cell transplant should receive a 3-dose series 6-12 months after her successful transplant. Hib vaccine is not recommended for adults with HIV infection. Preventive Services / Frequency Ages 64 to 68 years  Blood pressure check.** / Every 1 to 2 years.  Lipid and cholesterol check.** / Every 5 years beginning at age  22.  Clinical breast exam.** / Every 3 years for women in their 88s and 53s.  BRCA-related cancer risk assessment.** / For women who have family members with a BRCA-related cancer (breast, ovarian, tubal, or peritoneal cancers).  Pap test.** / Every 2 years from ages 90 through 51. Every 3 years starting at age 21 through age 56 or 3 with a history of 3 consecutive normal Pap tests.  HPV screening.** / Every 3 years from ages 24 through ages 1 to 46 with a history of 3 consecutive normal Pap tests.  Hepatitis C blood test.** / For any individual with known risks for hepatitis C.  Skin self-exam. / Monthly.  Influenza vaccine. / Every year.  Tetanus, diphtheria, and acellular pertussis (Tdap, Td) vaccine.** / Consult your health care provider. Pregnant women should receive 1 dose of Tdap vaccine during each pregnancy. 1 dose of Td every 10 years.  Varicella vaccine.** / Consult your health care provider. Pregnant females who do not have evidence of immunity should receive the first dose after pregnancy.  HPV vaccine. / 3 doses over 6 months, if 72 and younger. The vaccine is not recommended for use in pregnant females. However, pregnancy testing is not needed before receiving a dose.  Measles, mumps, rubella (MMR) vaccine.** / You need at least 1 dose of MMR if you were born in 1957 or later. You may also need a 2nd dose. For females of childbearing age, rubella immunity should be determined. If there is no evidence of immunity, females who are not pregnant should be vaccinated. If there is no evidence of immunity, females who are pregnant should delay immunization until after pregnancy.  Pneumococcal 13-valent conjugate (PCV13) vaccine.** / Consult your health care provider.  Pneumococcal polysaccharide (PPSV23) vaccine.** / 1 to 2 doses if you smoke cigarettes or if you have certain conditions.  Meningococcal vaccine.** /  1 dose if you are age 19 to 21 years and a first-year college  student living in a residence hall, or have one of several medical conditions, you need to get vaccinated against meningococcal disease. You may also need additional booster doses.  Hepatitis A vaccine.** / Consult your health care provider.  Hepatitis B vaccine.** / Consult your health care provider.  Haemophilus influenzae type b (Hib) vaccine.** / Consult your health care provider. Ages 40 to 64 years  Blood pressure check.** / Every 1 to 2 years.  Lipid and cholesterol check.** / Every 5 years beginning at age 20 years.  Lung cancer screening. / Every year if you are aged 55-80 years and have a 30-pack-year history of smoking and currently smoke or have quit within the past 15 years. Yearly screening is stopped once you have quit smoking for at least 15 years or develop a health problem that would prevent you from having lung cancer treatment.  Clinical breast exam.** / Every year after age 40 years.  BRCA-related cancer risk assessment.** / For women who have family members with a BRCA-related cancer (breast, ovarian, tubal, or peritoneal cancers).  Mammogram.** / Every year beginning at age 40 years and continuing for as long as you are in good health. Consult with your health care provider.  Pap test.** / Every 3 years starting at age 30 years through age 65 or 70 years with a history of 3 consecutive normal Pap tests.  HPV screening.** / Every 3 years from ages 30 years through ages 65 to 70 years with a history of 3 consecutive normal Pap tests.  Fecal occult blood test (FOBT) of stool. / Every year beginning at age 50 years and continuing until age 75 years. You may not need to do this test if you get a colonoscopy every 10 years.  Flexible sigmoidoscopy or colonoscopy.** / Every 5 years for a flexible sigmoidoscopy or every 10 years for a colonoscopy beginning at age 50 years and continuing until age 75 years.  Hepatitis C blood test.** / For all people born from 1945 through  1965 and any individual with known risks for hepatitis C.  Skin self-exam. / Monthly.  Influenza vaccine. / Every year.  Tetanus, diphtheria, and acellular pertussis (Tdap/Td) vaccine.** / Consult your health care provider. Pregnant women should receive 1 dose of Tdap vaccine during each pregnancy. 1 dose of Td every 10 years.  Varicella vaccine.** / Consult your health care provider. Pregnant females who do not have evidence of immunity should receive the first dose after pregnancy.  Zoster vaccine.** / 1 dose for adults aged 60 years or older.  Measles, mumps, rubella (MMR) vaccine.** / You need at least 1 dose of MMR if you were born in 1957 or later. You may also need a 2nd dose. For females of childbearing age, rubella immunity should be determined. If there is no evidence of immunity, females who are not pregnant should be vaccinated. If there is no evidence of immunity, females who are pregnant should delay immunization until after pregnancy.  Pneumococcal 13-valent conjugate (PCV13) vaccine.** / Consult your health care provider.  Pneumococcal polysaccharide (PPSV23) vaccine.** / 1 to 2 doses if you smoke cigarettes or if you have certain conditions.  Meningococcal vaccine.** / Consult your health care provider.  Hepatitis A vaccine.** / Consult your health care provider.  Hepatitis B vaccine.** / Consult your health care provider.  Haemophilus influenzae type b (Hib) vaccine.** / Consult your health care provider. Ages 65   years and over  Blood pressure check.** / Every 1 to 2 years.  Lipid and cholesterol check.** / Every 5 years beginning at age 22 years.  Lung cancer screening. / Every year if you are aged 73-80 years and have a 30-pack-year history of smoking and currently smoke or have quit within the past 15 years. Yearly screening is stopped once you have quit smoking for at least 15 years or develop a health problem that would prevent you from having lung cancer  treatment.  Clinical breast exam.** / Every year after age 4 years.  BRCA-related cancer risk assessment.** / For women who have family members with a BRCA-related cancer (breast, ovarian, tubal, or peritoneal cancers).  Mammogram.** / Every year beginning at age 40 years and continuing for as long as you are in good health. Consult with your health care provider.  Pap test.** / Every 3 years starting at age 9 years through age 34 or 91 years with 3 consecutive normal Pap tests. Testing can be stopped between 65 and 70 years with 3 consecutive normal Pap tests and no abnormal Pap or HPV tests in the past 10 years.  HPV screening.** / Every 3 years from ages 57 years through ages 64 or 45 years with a history of 3 consecutive normal Pap tests. Testing can be stopped between 65 and 70 years with 3 consecutive normal Pap tests and no abnormal Pap or HPV tests in the past 10 years.  Fecal occult blood test (FOBT) of stool. / Every year beginning at age 15 years and continuing until age 17 years. You may not need to do this test if you get a colonoscopy every 10 years.  Flexible sigmoidoscopy or colonoscopy.** / Every 5 years for a flexible sigmoidoscopy or every 10 years for a colonoscopy beginning at age 86 years and continuing until age 71 years.  Hepatitis C blood test.** / For all people born from [birth year] through 1964 and any individual with known risks for hepatitis C.  Osteoporosis screening.** / A one-time screening for women ages 83 years and over and women at risk for fractures or osteoporosis.  Skin self-exam. / Monthly.  Influenza vaccine. / Every year.  Tetanus, diphtheria, and acellular pertussis (Tdap/Td) vaccine.** / 1 dose of Td every 10 years.  Varicella vaccine.** / Consult your health care provider.  Zoster vaccine.** / 1 dose for adults aged 61 years or older.  Pneumococcal 13-valent conjugate (PCV13) vaccine.** / Consult your health care provider.  Pneumococcal  polysaccharide (PPSV23) vaccine.** / 1 dose for all adults aged 28 years and older.  Meningococcal vaccine.** / Consult your health care provider.  Hepatitis A vaccine.** / Consult your health care provider.  Hepatitis B vaccine.** / Consult your health care provider.  Haemophilus influenzae type b (Hib) vaccine.** / Consult your health care provider. ** Family history and personal history of risk and conditions may change your health care provider's recommendations. Document Released: 09/15/2001 Document Revised: 12/04/2013 Document Reviewed: 12/15/2010 [facility] Patient Information 2015 [place], [place]. This information is not intended to replace advice given to you by your health care provider. Make sure you discuss any questions you have with your health care provider.

## 2014-11-08 NOTE — Addendum Note (Signed)
Addended by: Barbara CowerNOGUES, Nayla Dias L on: 11/08/2014 01:13 PM   Modules accepted: Orders

## 2014-11-08 NOTE — Progress Notes (Signed)
Here today for yearly physical exam.  Has noticed discharge with odor.  Wants full STD screening.  [employer] employee.

## 2014-11-08 NOTE — Progress Notes (Signed)
  Subjective:     Julia CarasBrittany N Clark is a 29 y.o. female and is here for a comprehensive physical exam. The patient reports problems - would like STD screen. Interested in tummy tuck. Has IUD and is having light cycles.  History   Social History  . Marital Status: Single    Spouse Name: N/A  . Number of Children: N/A  . Years of Education: N/A   Occupational History  . Not on file.   Social History Main Topics  . Smoking status: Never Smoker   . Smokeless tobacco: Never Used  . Alcohol Use: No  . Drug Use: No  . Sexual Activity:    Partners: Male    Birth Control/ Protection: IUD   Other Topics Concern  . Not on file   Social History Narrative   Health Maintenance  Topic Date Due  . INFLUENZA VACCINE  03/04/2015  . PAP SMEAR  05/30/2016  . TETANUS/TDAP  10/24/2023  . HIV Screening  Completed    The following portions of the patient's history were reviewed and updated as appropriate: allergies, current medications, past family history, past medical history, past social history, past surgical history and problem list.  Review of Systems Pertinent items are noted in HPI.   Objective:    BP 111/69 mmHg  Pulse 108  Ht 5\' 7"  (1.702 m)  Wt 161 lb (73.029 kg)  BMI 25.21 kg/m2  LMP 11/01/2014  Breastfeeding? No General appearance: alert, cooperative and appears stated age Head: Normocephalic, without obvious abnormality, atraumatic Neck: no adenopathy, supple, symmetrical, trachea midline and thyroid not enlarged, symmetric, no tenderness/mass/nodules Lungs: clear to auscultation bilaterally Breasts: normal appearance, no masses or tenderness Heart: regular rate and rhythm, S1, S2 normal, no murmur, click, rub or gallop Abdomen: soft, non-tender; bowel sounds normal; no masses,  no organomegaly Pelvic: cervix normal in appearance, external genitalia normal, no adnexal masses or tenderness, no cervical motion tenderness, uterus normal size, shape, and consistency, vagina  normal without discharge and IUD strings visualized Extremities: extremities normal, atraumatic, no cyanosis or edema Pulses: 2+ and symmetric Skin: Skin color, texture, turgor normal. No rashes or lesions Lymph nodes: Cervical, supraclavicular, and axillary nodes normal. Neurologic: Grossly normal    Assessment:    Healthy female exam.      Plan:   Problem List Items Addressed This Visit    None    Visit Diagnoses    Encounter for routine gynecological examination    -  Primary    Relevant Orders    Pap Lb, Ct-Ng, rfx HPV ASCU    Screen for STD (sexually transmitted disease)        Relevant Orders    RPR    HIV antibody    Hepatitis B surface antigen    Hepatitis C antibody    Screening for malignant neoplasm of cervix        Relevant Orders    Pap Lb, Ct-Ng, rfx HPV ASCU         See After Visit Summary for Counseling Recommendations

## 2014-11-09 LAB — HIV ANTIBODY (ROUTINE TESTING W REFLEX): HIV SCREEN 4TH GENERATION: NONREACTIVE

## 2014-11-09 LAB — RPR: RPR: NONREACTIVE

## 2014-11-09 LAB — HEPATITIS C ANTIBODY: Hep C Virus Ab: <0.1 s/co ratio (ref 0.0–0.9)

## 2014-11-09 LAB — HEPATITIS B SURFACE ANTIGEN: Hepatitis B Surface Ag: NEGATIVE

## 2014-11-13 LAB — NUSWAB VAGINITIS PLUS (VG+)
Chlamydia trachomatis, NAA: POSITIVE — AB
Neisseria gonorrhoeae, NAA: NEGATIVE

## 2014-11-14 LAB — PAP LB, CT-NG, RFX HPV ASCU: PAP SMEAR COMMENT: 0

## 2014-11-15 ENCOUNTER — Telehealth: Payer: Self-pay | Admitting: *Deleted

## 2014-11-15 MED ORDER — AZITHROMYCIN 500 MG PO TABS: 500.0000 mg | ORAL_TABLET | Freq: Every day | ORAL | Status: DC

## 2014-11-15 NOTE — Telephone Encounter (Signed)
-----   Message from Reva Boresanya S Pratt, MD sent at 11/13/2014  1:19 PM EDT ----- Chlamydia is positive--needs treatment + partner treatment

## 2014-11-15 NOTE — Telephone Encounter (Signed)
Advised patient and medication has been called in.

## 2014-11-19 ENCOUNTER — Telehealth: Payer: Self-pay | Admitting: *Deleted

## 2014-11-19 ENCOUNTER — Encounter: Payer: Self-pay | Admitting: *Deleted

## 2014-11-19 NOTE — Telephone Encounter (Signed)
Health department called to confirm treatment.

## 2014-12-06 ENCOUNTER — Encounter: Payer: Self-pay | Admitting: Obstetrics and Gynecology

## 2014-12-06 ENCOUNTER — Ambulatory Visit (INDEPENDENT_AMBULATORY_CARE_PROVIDER_SITE_OTHER): Payer: 59 | Admitting: Obstetrics and Gynecology

## 2014-12-06 VITALS — BP 114/68 | HR 85 | Wt 170.0 lb

## 2014-12-06 DIAGNOSIS — A749 Chlamydial infection, unspecified: Secondary | ICD-10-CM | POA: Diagnosis not present

## 2014-12-06 NOTE — Progress Notes (Signed)
Patient ID: Julia CarasBrittany N Clark, female   DOB: 30-Sep-1985, 29 y.o.   MRN: [id number] 29 yo with recent positive chlamydia infection s/p treatment presenting today for test of cure. Patient is without any other complaints.   GENERAL: Well-developed, well-nourished female in no acute distress.  PELVIC: Normal external female genitalia. Vagina is pink and rugated.  Normal discharge. Normal appearing cervix. Uterus is normal in size. No adnexal mass or tenderness. EXTREMITIES: No cyanosis, clubbing, or edema, 2+ distal pulses.  A/P 29 yo here for test of cure - Cultures collected - patient will be contacted with any abnormal results - patient reports partner was treated.

## 2014-12-07 LAB — GC/CHLAMYDIA PROBE AMP
CT Probe RNA: NEGATIVE
GC Probe RNA: NEGATIVE

## 2015-07-15 ENCOUNTER — Ambulatory Visit (INDEPENDENT_AMBULATORY_CARE_PROVIDER_SITE_OTHER): Payer: 59 | Admitting: Family Medicine

## 2015-07-15 ENCOUNTER — Encounter: Payer: Self-pay | Admitting: Family Medicine

## 2015-07-15 VITALS — BP 114/73 | HR 90 | Ht 67.0 in | Wt 170.0 lb

## 2015-07-15 DIAGNOSIS — N898 Other specified noninflammatory disorders of vagina: Secondary | ICD-10-CM

## 2015-07-15 NOTE — Progress Notes (Signed)
   Subjective:    Patient ID: Julia CarasBrittany N Clark is a 29 y.o. female presenting with Vaginal Discharge  on 07/15/2015  HPI: Here for vaginal discharge with odor.  Notes mostly after her cycles. Has just some spotting as cycles with IUD in.  Previously went away without treatment. This has been going on for 3 wks. No vaginal irritation, itching.  Review of Systems  Constitutional: Negative for fever and chills.  Respiratory: Negative for shortness of breath.   Cardiovascular: Negative for chest pain.  Gastrointestinal: Negative for nausea, vomiting and abdominal pain.  Genitourinary: Negative for dysuria.  Skin: Negative for rash.      Objective:    BP 114/73 mmHg  Pulse 90  Ht 5\' 7"  (1.702 m)  Wt 170 lb (77.111 kg)  BMI 26.62 kg/m2  LMP 06/24/2015 (Approximate) Physical Exam  Constitutional: She is oriented to person, place, and time. She appears well-developed and well-nourished. No distress.  HENT:  Head: Normocephalic and atraumatic.  Eyes: No scleral icterus.  Neck: Neck supple.  Cardiovascular: Normal rate.   Pulmonary/Chest: Effort normal.  Abdominal: Soft.  Genitourinary: Vaginal discharge: white.  BUS normal, vagina is pink and rugated, thin white discharge noted with odor, cervix is multiparous without lesion, uterus is small and anteverted, no adnexal mass or tenderness.   Neurological: She is alert and oriented to person, place, and time.  Skin: Skin is warm and dry.  Psychiatric: She has a normal mood and affect.        Assessment & Plan:   Problem List Items Addressed This Visit    None    Visit Diagnoses    Vaginal discharge    -  Primary    Unclear etiology--will await cultures and treat accordingly.    Relevant Orders    NuSwab Vaginitis Plus (VG+)        Return if symptoms worsen or fail to improve.  Janashia Parco S 07/15/2015 1:27 PM

## 2015-07-15 NOTE — Patient Instructions (Signed)

## 2015-07-17 ENCOUNTER — Telehealth: Payer: Self-pay | Admitting: *Deleted

## 2015-07-17 DIAGNOSIS — A749 Chlamydial infection, unspecified: Secondary | ICD-10-CM

## 2015-07-17 MED ORDER — AZITHROMYCIN 500 MG PO TABS: ORAL_TABLET | ORAL | Status: DC

## 2015-07-17 NOTE — Telephone Encounter (Signed)
-----   Message from Reva Boresanya S Pratt, MD sent at 07/17/2015  1:42 PM EST ----- Her chlamydia is positive--she needs Azithromycin 1 gm x 1 and partner should be treated.

## 2015-07-17 NOTE — Telephone Encounter (Signed)
Opened in error, Chrissie, CMA has informed pt of result.

## 2015-07-17 NOTE — Telephone Encounter (Signed)
Patient aware.  I have sent in prescription to pharmacy with medication for partner as well.  Patient said last infection we treated partner.

## 2015-07-17 NOTE — Telephone Encounter (Signed)
-----   Message from Tanya S Pratt, MD sent at 07/17/2015  1:42 PM EST ----- Her chlamydia is positive--she needs Azithromycin 1 gm x 1 and partner should be treated. 

## 2015-07-18 ENCOUNTER — Telehealth: Payer: Self-pay | Admitting: *Deleted

## 2015-07-18 DIAGNOSIS — N760 Acute vaginitis: Principal | ICD-10-CM

## 2015-07-18 DIAGNOSIS — B9689 Other specified bacterial agents as the cause of diseases classified elsewhere: Secondary | ICD-10-CM

## 2015-07-18 LAB — NUSWAB VAGINITIS PLUS (VG+)
Atopobium vaginae: HIGH Score — AB
CANDIDA GLABRATA, NAA: NEGATIVE

## 2015-07-18 MED ORDER — METRONIDAZOLE 500 MG PO TABS: 500.0000 mg | ORAL_TABLET | Freq: Two times a day (BID) | ORAL | Status: DC

## 2015-07-18 NOTE — Telephone Encounter (Signed)
-----   Message from Reva Boresanya S Pratt, MD sent at 07/18/2015  8:24 AM EST ----- Looks like BV also--consider Flagyl 500 mg po bid x 7 d

## 2015-07-18 NOTE — Telephone Encounter (Signed)
Patient aware.  I have sent in prescription.

## 2015-10-02 ENCOUNTER — Ambulatory Visit (INDEPENDENT_AMBULATORY_CARE_PROVIDER_SITE_OTHER): Payer: 59 | Admitting: Obstetrics & Gynecology

## 2015-10-02 ENCOUNTER — Encounter: Payer: Self-pay | Admitting: Obstetrics & Gynecology

## 2015-10-02 VITALS — BP 110/73 | HR 98 | Resp 18 | Ht 67.0 in | Wt 176.0 lb

## 2015-10-02 DIAGNOSIS — Z30011 Encounter for initial prescription of contraceptive pills: Secondary | ICD-10-CM

## 2015-10-02 DIAGNOSIS — Z30432 Encounter for removal of intrauterine contraceptive device: Secondary | ICD-10-CM

## 2015-10-02 DIAGNOSIS — R102 Pelvic and perineal pain: Secondary | ICD-10-CM | POA: Diagnosis not present

## 2015-10-02 MED ORDER — LEVONORGEST-ETH ESTRAD 91-DAY 0.15-0.03 &0.01 MG PO TABS: 1.0000 | ORAL_TABLET | Freq: Every day | ORAL | Status: DC

## 2015-10-02 NOTE — Progress Notes (Signed)
Patient ID: Julia Levine, female   DOB: Aug 05, 1985, 30 y.o.   MRN: [id number]  Chief Complaint  Patient presents with  . Pelvic Pain  requests IUD removal and rx for OCP  HPI Julia Levine is a 30 y.o. female.  Julia Levine Patient's last menstrual period was 09/16/2015. Mirena in place since 2015, has intermittent pelvic pain and requests removal and prescription for OCP, extended cycle.  HPI  Past Medical History  Diagnosis Date  . 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 Hypertension Mother     Social History Social History  Substance Use Topics  . Smoking status: Never Smoker   . Smokeless tobacco: Never Used  . Alcohol Use: No    No Known Allergies  Current Outpatient Prescriptions  Medication Sig Dispense Refill  . levonorgestrel (MIRENA) 20 MCG/24HR IUD 1 each by Intrauterine route once.    . Levonorgestrel-Ethinyl Estradiol (AMETHIA,CAMRESE) 0.15-0.03 &0.01 MG tablet Take 1 tablet by mouth daily. 1 Package 4   No current facility-administered medications for this visit.    Review of Systems Review of Systems  Constitutional: Negative.   Genitourinary: Positive for pelvic pain. Negative for vaginal discharge and menstrual problem.    Blood pressure 110/73, pulse 98, resp. rate 18, height  (1.702 m), weight 176 lb (79.833 kg), last menstrual period 09/16/2015, not currently breastfeeding.  Physical Exam Physical Exam  Constitutional: She appears well-developed. No distress.  Pulmonary/Chest: Effort normal.  Genitourinary: Vagina normal. No vaginal discharge found.  String visible at os, grasped with forceps and removed intact   Skin: Skin is warm and dry.  Psychiatric: She has a normal mood and affect. Her behavior is normal.    Data Reviewed Pap and biopsy results  Assessment     Pelvic pain with Mirena in place, removed at her request   h/o abnormal pap, colposcopy  Plan    Seasonique prescribed, use backup method for one week Return for f/u pap as scheduled        Aven Christen 10/02/2015, 10:37 AM

## 2015-10-02 NOTE — Patient Instructions (Signed)

## 2015-10-02 NOTE — Progress Notes (Signed)
Pt here today c/o pelvic pain that she feels is associated with the IUD, was placed in 2015 and has occasional pain on and off and continues to have a menstrual cycle as well. Is considering OCP for birth control.

## 2015-11-11 ENCOUNTER — Ambulatory Visit (INDEPENDENT_AMBULATORY_CARE_PROVIDER_SITE_OTHER): Payer: 59 | Admitting: Family Medicine

## 2015-11-11 ENCOUNTER — Encounter: Payer: Self-pay | Admitting: Family Medicine

## 2015-11-11 VITALS — BP 128/74 | HR 80 | Resp 18 | Ht 67.0 in | Wt 173.0 lb

## 2015-11-11 DIAGNOSIS — Z01419 Encounter for gynecological examination (general) (routine) without abnormal findings: Secondary | ICD-10-CM | POA: Diagnosis not present

## 2015-11-11 DIAGNOSIS — Z124 Encounter for screening for malignant neoplasm of cervix: Secondary | ICD-10-CM | POA: Diagnosis not present

## 2015-11-11 NOTE — Progress Notes (Signed)
  Subjective:     Julia Levine is a 30 y.o. female and is here for a comprehensive physical exam. The patient reports no problems.  Social History   Social History  . Marital Status: Single    Spouse Name: N/A  . Number of Children: N/A  . Years of Education: N/A   Occupational History  . Not on file.   Social History Main Topics  . Smoking status: Never Smoker   . Smokeless tobacco: Never Used  . Alcohol Use: No  . Drug Use: No  . Sexual Activity:    Partners: Male    Birth Control/ Protection: Pill   Other Topics Concern  . Not on file   Social History Narrative   Health Maintenance  Topic Date Due  . INFLUENZA VACCINE  03/03/2016  . PAP SMEAR  11/07/2017  . TETANUS/TDAP  10/24/2023  . HIV Screening  Completed    The following portions of the patient's history were reviewed and updated as appropriate: allergies, current medications, past family history, past medical history, past social history, past surgical history and problem list.  Review of Systems Pertinent items noted in HPI and remainder of comprehensive ROS otherwise negative.   Objective:    BP 128/74 mmHg  Pulse 80  Resp 18  Ht 5\' 7"  (1.702 m)  Wt 173 lb (78.472 kg)  BMI 27.09 kg/m2  LMP 09/09/2015 General appearance: alert, cooperative and appears stated age Head: Normocephalic, without obvious abnormality, atraumatic Neck: no adenopathy, supple, symmetrical, trachea midline and thyroid not enlarged, symmetric, no tenderness/mass/nodules Lungs: clear to auscultation bilaterally Breasts: normal appearance, no masses or tenderness Heart: regular rate and rhythm, S1, S2 normal, no murmur, click, rub or gallop Abdomen: soft, non-tender; bowel sounds normal; no masses,  no organomegaly Pelvic: cervix normal in appearance, external genitalia normal, no adnexal masses or tenderness, no cervical motion tenderness, uterus normal size, shape, and consistency and vagina normal without  discharge Extremities: extremities normal, atraumatic, no cyanosis or edema Pulses: 2+ and symmetric Skin: Skin color, texture, turgor normal. No rashes or lesions Lymph nodes: Cervical, supraclavicular, and axillary nodes normal. Neurologic: Grossly normal    Assessment:    Healthy female exam.      Plan:     1. Screening for malignant neoplasm of cervix  - Pap Lb, Ct-Ng, rfx HPV ASCU  2. Encounter for routine gynecological examination Annual labs and flu per her job.  See After Visit Summary for Counseling Recommendations

## 2015-11-11 NOTE — Patient Instructions (Signed)
Preventive Care for Adults, Female A healthy lifestyle and preventive care can promote health and wellness. Preventive health guidelines for women include the following key practices.  A routine yearly physical is a good way to check with your health care provider about your health and preventive screening. It is a chance to share any concerns and updates on your health and to receive a thorough exam.  Visit your dentist for a routine exam and preventive care every 6 months. Brush your teeth twice a day and floss once a day. Good oral hygiene prevents tooth decay and gum disease.  The frequency of eye exams is based on your age, health, family medical history, use of contact lenses, and other factors. Follow your health care provider's recommendations for frequency of eye exams.  Eat a healthy diet. Foods like vegetables, fruits, whole grains, low-fat dairy products, and lean protein foods contain the nutrients you need without too many calories. Decrease your intake of foods high in solid fats, added sugars, and salt. Eat the right amount of calories for you.Get information about a proper diet from your health care provider, if necessary.  Regular physical exercise is one of the most important things you can do for your health. Most adults should get at least 150 minutes of moderate-intensity exercise (any activity that increases your heart rate and causes you to sweat) each week. In addition, most adults need muscle-strengthening exercises on 2 or more days a week.  Maintain a healthy weight. The body mass index (BMI) is a screening tool to identify possible weight problems. It provides an estimate of body fat based on height and weight. Your health care provider can find your BMI and can help you achieve or maintain a healthy weight.For adults 20 years and older:  A BMI below 18.5 is considered underweight.  A BMI of 18.5 to 24.9 is normal.  A BMI of 25 to 29.9 is considered overweight.  A  BMI of 30 and above is considered obese.  Maintain normal blood lipids and cholesterol levels by exercising and minimizing your intake of saturated fat. Eat a balanced diet with plenty of fruit and vegetables. Blood tests for lipids and cholesterol should begin at age 45 and be repeated every 5 years. If your lipid or cholesterol levels are high, you are over 50, or you are at high risk for heart disease, you may need your cholesterol levels checked more frequently.Ongoing high lipid and cholesterol levels should be treated with medicines if diet and exercise are not working.  If you smoke, find out from your health care provider how to quit. If you do not use tobacco, do not start.  Lung cancer screening is recommended for adults aged 45-80 years who are at high risk for developing lung cancer because of a history of smoking. A yearly low-dose CT scan of the lungs is recommended for people who have at least a 30-pack-year history of smoking and are a current smoker or have quit within the past 15 years. A pack year of smoking is smoking an average of 1 pack of cigarettes a day for 1 year (for example: 1 pack a day for 30 years or 2 packs a day for 15 years). Yearly screening should continue until the smoker has stopped smoking for at least 15 years. Yearly screening should be stopped for people who develop a health problem that would prevent them from having lung cancer treatment.  If you are pregnant, do not drink alcohol. If you are  breastfeeding, be very cautious about drinking alcohol. If you are not pregnant and choose to drink alcohol, do not have more than 1 drink per day. One drink is considered to be 12 ounces (355 mL) of beer, 5 ounces (148 mL) of wine, or 1.5 ounces (44 mL) of liquor.  Avoid use of street drugs. Do not share needles with anyone. Ask for help if you need support or instructions about stopping the use of drugs.  High blood pressure causes heart disease and increases the risk  of stroke. Your blood pressure should be checked at least every 1 to 2 years. Ongoing high blood pressure should be treated with medicines if weight loss and exercise do not work.  If you are 55-79 years old, ask your health care provider if you should take aspirin to prevent strokes.  Diabetes screening is done by taking a blood sample to check your blood glucose level after you have not eaten for a certain period of time (fasting). If you are not overweight and you do not have risk factors for diabetes, you should be screened once every 3 years starting at age 45. If you are overweight or obese and you are 40-70 years of age, you should be screened for diabetes every year as part of your cardiovascular risk assessment.  Breast cancer screening is essential preventive care for women. You should practice "breast self-awareness." This means understanding the normal appearance and feel of your breasts and may include breast self-examination. Any changes detected, no matter how small, should be reported to a health care provider. Women in their 20s and 30s should have a clinical breast exam (CBE) by a health care provider as part of a regular health exam every 1 to 3 years. After age 40, women should have a CBE every year. Starting at age 40, women should consider having a mammogram (breast X-ray test) every year. Women who have a family history of breast cancer should talk to their health care provider about genetic screening. Women at a high risk of breast cancer should talk to their health care providers about having an MRI and a mammogram every year.  Breast cancer gene (BRCA)-related cancer risk assessment is recommended for women who have family members with BRCA-related cancers. BRCA-related cancers include breast, ovarian, tubal, and peritoneal cancers. Having family members with these cancers may be associated with an increased risk for harmful changes (mutations) in the breast cancer genes BRCA1 and  BRCA2. Results of the assessment will determine the need for genetic counseling and BRCA1 and BRCA2 testing.  Your health care provider may recommend that you be screened regularly for cancer of the pelvic organs (ovaries, uterus, and vagina). This screening involves a pelvic examination, including checking for microscopic changes to the surface of your cervix (Pap test). You may be encouraged to have this screening done every 3 years, beginning at age 21.  For women ages 30-65, health care providers may recommend pelvic exams and Pap testing every 3 years, or they may recommend the Pap and pelvic exam, combined with testing for human papilloma virus (HPV), every 5 years. Some types of HPV increase your risk of cervical cancer. Testing for HPV may also be done on women of any age with unclear Pap test results.  Other health care providers may not recommend any screening for nonpregnant women who are considered low risk for pelvic cancer and who do not have symptoms. Ask your health care provider if a screening pelvic exam is right for   you.  If you have had past treatment for cervical cancer or a condition that could lead to cancer, you need Pap tests and screening for cancer for at least 20 years after your treatment. If Pap tests have been discontinued, your risk factors (such as having a new sexual partner) need to be reassessed to determine if screening should resume. Some women have medical problems that increase the chance of getting cervical cancer. In these cases, your health care provider may recommend more frequent screening and Pap tests.  Colorectal cancer can be detected and often prevented. Most routine colorectal cancer screening begins at the age of 50 years and continues through age 75 years. However, your health care provider may recommend screening at an earlier age if you have risk factors for colon cancer. On a yearly basis, your health care provider may provide home test kits to check  for hidden blood in the stool. Use of a small camera at the end of a tube, to directly examine the colon (sigmoidoscopy or colonoscopy), can detect the earliest forms of colorectal cancer. Talk to your health care provider about this at age 50, when routine screening begins. Direct exam of the colon should be repeated every 5-10 years through age 75 years, unless early forms of precancerous polyps or small growths are found.  People who are at an increased risk for hepatitis B should be screened for this virus. You are considered at high risk for hepatitis B if:  You were born in a country where hepatitis B occurs often. Talk with your health care provider about which countries are considered high risk.  Your parents were born in a high-risk country and you have not received a shot to protect against hepatitis B (hepatitis B vaccine).  You have HIV or AIDS.  You use needles to inject street drugs.  You live with, or have sex with, someone who has hepatitis B.  You get hemodialysis treatment.  You take certain medicines for conditions like cancer, organ transplantation, and autoimmune conditions.  Hepatitis C blood testing is recommended for all people born from 1945 through 1965 and any individual with known risks for hepatitis C.  Practice safe sex. Use condoms and avoid high-risk sexual practices to reduce the spread of sexually transmitted infections (STIs). STIs include gonorrhea, chlamydia, syphilis, trichomonas, herpes, HPV, and human immunodeficiency virus (HIV). Herpes, HIV, and HPV are viral illnesses that have no cure. They can result in disability, cancer, and death.  You should be screened for sexually transmitted illnesses (STIs) including gonorrhea and chlamydia if:  You are sexually active and are younger than 24 years.  You are older than 24 years and your health care provider tells you that you are at risk for this type of infection.  Your sexual activity has changed  since you were last screened and you are at an increased risk for chlamydia or gonorrhea. Ask your health care provider if you are at risk.  If you are at risk of being infected with HIV, it is recommended that you take a prescription medicine daily to prevent HIV infection. This is called preexposure prophylaxis (PrEP). You are considered at risk if:  You are sexually active and do not regularly use condoms or know the HIV status of your partner(s).  You take drugs by injection.  You are sexually active with a partner who has HIV.  Talk with your health care provider about whether you are at high risk of being infected with HIV. If   you choose to begin PrEP, you should first be tested for HIV. You should then be tested every 3 months for as long as you are taking PrEP.  Osteoporosis is a disease in which the bones lose minerals and strength with aging. This can result in serious bone fractures or breaks. The risk of osteoporosis can be identified using a bone density scan. Women ages 67 years and over and women at risk for fractures or osteoporosis should discuss screening with their health care providers. Ask your health care provider whether you should take a calcium supplement or vitamin D to reduce the rate of osteoporosis.  Menopause can be associated with physical symptoms and risks. Hormone replacement therapy is available to decrease symptoms and risks. You should talk to your health care provider about whether hormone replacement therapy is right for you.  Use sunscreen. Apply sunscreen liberally and repeatedly throughout the day. You should seek shade when your shadow is shorter than you. Protect yourself by wearing long sleeves, pants, a wide-brimmed hat, and sunglasses year round, whenever you are outdoors.  Once a month, do a whole body skin exam, using a mirror to look at the skin on your back. Tell your health care provider of new moles, moles that have irregular borders, moles that  are larger than a pencil eraser, or moles that have changed in shape or color.  Stay current with required vaccines (immunizations).  Influenza vaccine. All adults should be immunized every year.  Tetanus, diphtheria, and acellular pertussis (Td, Tdap) vaccine. Pregnant women should receive 1 dose of Tdap vaccine during each pregnancy. The dose should be obtained regardless of the length of time since the last dose. Immunization is preferred during the 27th-36th week of gestation. An adult who has not previously received Tdap or who does not know her vaccine status should receive 1 dose of Tdap. This initial dose should be followed by tetanus and diphtheria toxoids (Td) booster doses every 10 years. Adults with an unknown or incomplete history of completing a 3-dose immunization series with Td-containing vaccines should begin or complete a primary immunization series including a Tdap dose. Adults should receive a Td booster every 10 years.  Varicella vaccine. An adult without evidence of immunity to varicella should receive 2 doses or a second dose if she has previously received 1 dose. Pregnant females who do not have evidence of immunity should receive the first dose after pregnancy. This first dose should be obtained before leaving the health care facility. The second dose should be obtained 4-8 weeks after the first dose.  Human papillomavirus (HPV) vaccine. Females aged 13-26 years who have not received the vaccine previously should obtain the 3-dose series. The vaccine is not recommended for use in pregnant females. However, pregnancy testing is not needed before receiving a dose. If a female is found to be pregnant after receiving a dose, no treatment is needed. In that case, the remaining doses should be delayed until after the pregnancy. Immunization is recommended for any person with an immunocompromised condition through the age of 61 years if she did not get any or all doses earlier. During the  3-dose series, the second dose should be obtained 4-8 weeks after the first dose. The third dose should be obtained 24 weeks after the first dose and 16 weeks after the second dose.  Zoster vaccine. One dose is recommended for adults aged 30 years or older unless certain conditions are present.  Measles, mumps, and rubella (MMR) vaccine. Adults born  before 1957 generally are considered immune to measles and mumps. Adults born in 1957 or later should have 1 or more doses of MMR vaccine unless there is a contraindication to the vaccine or there is laboratory evidence of immunity to each of the three diseases. A routine second dose of MMR vaccine should be obtained at least 28 days after the first dose for students attending postsecondary schools, health care workers, or international travelers. People who received inactivated measles vaccine or an unknown type of measles vaccine during 1963-1967 should receive 2 doses of MMR vaccine. People who received inactivated mumps vaccine or an unknown type of mumps vaccine before 1979 and are at high risk for mumps infection should consider immunization with 2 doses of MMR vaccine. For females of childbearing age, rubella immunity should be determined. If there is no evidence of immunity, females who are not pregnant should be vaccinated. If there is no evidence of immunity, females who are pregnant should delay immunization until after pregnancy. Unvaccinated health care workers born before 1957 who lack laboratory evidence of measles, mumps, or rubella immunity or laboratory confirmation of disease should consider measles and mumps immunization with 2 doses of MMR vaccine or rubella immunization with 1 dose of MMR vaccine.  Pneumococcal 13-valent conjugate (PCV13) vaccine. When indicated, a person who is uncertain of his immunization history and has no record of immunization should receive the PCV13 vaccine. All adults 65 years of age and older should receive this  vaccine. An adult aged 19 years or older who has certain medical conditions and has not been previously immunized should receive 1 dose of PCV13 vaccine. This PCV13 should be followed with a dose of pneumococcal polysaccharide (PPSV23) vaccine. Adults who are at high risk for pneumococcal disease should obtain the PPSV23 vaccine at least 8 weeks after the dose of PCV13 vaccine. Adults older than 29 years of age who have normal immune system function should obtain the PPSV23 vaccine dose at least 1 year after the dose of PCV13 vaccine.  Pneumococcal polysaccharide (PPSV23) vaccine. When PCV13 is also indicated, PCV13 should be obtained first. All adults aged 65 years and older should be immunized. An adult younger than age 65 years who has certain medical conditions should be immunized. Any person who resides in a nursing home or long-term care facility should be immunized. An adult smoker should be immunized. People with an immunocompromised condition and certain other conditions should receive both PCV13 and PPSV23 vaccines. People with human immunodeficiency virus (HIV) infection should be immunized as soon as possible after diagnosis. Immunization during chemotherapy or radiation therapy should be avoided. Routine use of PPSV23 vaccine is not recommended for American Indians, [place] Natives, or people younger than 65 years unless there are medical conditions that require PPSV23 vaccine. When indicated, people who have unknown immunization and have no record of immunization should receive PPSV23 vaccine. One-time revaccination 5 years after the first dose of PPSV23 is recommended for people aged 19-64 years who have chronic kidney failure, nephrotic syndrome, asplenia, or immunocompromised conditions. People who received 1-2 doses of PPSV23 before age 65 years should receive another dose of PPSV23 vaccine at age 65 years or later if at least 5 years have passed since the previous dose. Doses of PPSV23 are not  needed for people immunized with PPSV23 at or after age 65 years.  Meningococcal vaccine. Adults with asplenia or persistent complement component deficiencies should receive 2 doses of quadrivalent meningococcal conjugate (MenACWY-D) vaccine. The doses should be obtained   at least 2 months apart. Microbiologists working with certain meningococcal bacteria, [place] recruits, people at risk during an outbreak, and people who travel to or live in countries with a high rate of meningitis should be immunized. A first-year college student up through age 34 years who is living in a residence hall should receive a dose if she did not receive a dose on or after her 16th birthday. Adults who have certain high-risk conditions should receive one or more doses of vaccine.  Hepatitis A vaccine. Adults who wish to be protected from this disease, have certain high-risk conditions, work with hepatitis A-infected animals, work in hepatitis A research labs, or travel to or work in countries with a high rate of hepatitis A should be immunized. Adults who were previously unvaccinated and who anticipate close contact with an international adoptee during the first 60 days after arrival in the [place] from a country with a high rate of hepatitis A should be immunized.  Hepatitis B vaccine. Adults who wish to be protected from this disease, have certain high-risk conditions, may be exposed to blood or other infectious body fluids, are household contacts or sex partners of hepatitis B positive people, are clients or workers in certain care facilities, or travel to or work in countries with a high rate of hepatitis B should be immunized.  Haemophilus influenzae type b (Hib) vaccine. A previously unvaccinated person with asplenia or sickle cell disease or having a scheduled splenectomy should receive 1 dose of Hib vaccine. Regardless of previous immunization, a recipient of a hematopoietic stem cell transplant should receive a  3-dose series 6-12 months after her successful transplant. Hib vaccine is not recommended for adults with HIV infection. Preventive Services / Frequency Ages 35 to 4 years  Blood pressure check.** / Every 3-5 years.  Lipid and cholesterol check.** / Every 5 years beginning at age 60.  Clinical breast exam.** / Every 3 years for women in their 71s and 10s.  BRCA-related cancer risk assessment.** / For women who have family members with a BRCA-related cancer (breast, ovarian, tubal, or peritoneal cancers).  Pap test.** / Every 2 years from ages 76 through 26. Every 3 years starting at age 61 through age 76 or 93 with a history of 3 consecutive normal Pap tests.  HPV screening.** / Every 3 years from ages 37 through ages 60 to 51 with a history of 3 consecutive normal Pap tests.  Hepatitis C blood test.** / For any individual with known risks for hepatitis C.  Skin self-exam. / Monthly.  Influenza vaccine. / Every year.  Tetanus, diphtheria, and acellular pertussis (Tdap, Td) vaccine.** / Consult your health care provider. Pregnant women should receive 1 dose of Tdap vaccine during each pregnancy. 1 dose of Td every 10 years.  Varicella vaccine.** / Consult your health care provider. Pregnant females who do not have evidence of immunity should receive the first dose after pregnancy.  HPV vaccine. / 3 doses over 6 months, if 93 and younger. The vaccine is not recommended for use in pregnant females. However, pregnancy testing is not needed before receiving a dose.  Measles, mumps, rubella (MMR) vaccine.** / You need at least 1 dose of MMR if you were born in 1957 or later. You may also need a 2nd dose. For females of childbearing age, rubella immunity should be determined. If there is no evidence of immunity, females who are not pregnant should be vaccinated. If there is no evidence of immunity, females who are  pregnant should delay immunization until after pregnancy.  Pneumococcal  13-valent conjugate (PCV13) vaccine.** / Consult your health care provider.  Pneumococcal polysaccharide (PPSV23) vaccine.** / 1 to 2 doses if you smoke cigarettes or if you have certain conditions.  Meningococcal vaccine.** / 1 dose if you are age 68 to 8 years and a Market researcher living in a residence hall, or have one of several medical conditions, you need to get vaccinated against meningococcal disease. You may also need additional booster doses.  Hepatitis A vaccine.** / Consult your health care provider.  Hepatitis B vaccine.** / Consult your health care provider.  Haemophilus influenzae type b (Hib) vaccine.** / Consult your health care provider. Ages 7 to 53 years  Blood pressure check.** / Every year.  Lipid and cholesterol check.** / Every 5 years beginning at age 25 years.  Lung cancer screening. / Every year if you are aged 11-80 years and have a 30-pack-year history of smoking and currently smoke or have quit within the past 15 years. Yearly screening is stopped once you have quit smoking for at least 15 years or develop a health problem that would prevent you from having lung cancer treatment.  Clinical breast exam.** / Every year after age 48 years.  BRCA-related cancer risk assessment.** / For women who have family members with a BRCA-related cancer (breast, ovarian, tubal, or peritoneal cancers).  Mammogram.** / Every year beginning at age 41 years and continuing for as long as you are in good health. Consult with your health care provider.  Pap test.** / Every 3 years starting at age 65 years through age 37 or 70 years with a history of 3 consecutive normal Pap tests.  HPV screening.** / Every 3 years from ages 72 years through ages 60 to 40 years with a history of 3 consecutive normal Pap tests.  Fecal occult blood test (FOBT) of stool. / Every year beginning at age 21 years and continuing until age 5 years. You may not need to do this test if you get  a colonoscopy every 10 years.  Flexible sigmoidoscopy or colonoscopy.** / Every 5 years for a flexible sigmoidoscopy or every 10 years for a colonoscopy beginning at age 35 years and continuing until age 48 years.  Hepatitis C blood test.** / For all people born from [birth year] through 1965 and any individual with known risks for hepatitis C.  Skin self-exam. / Monthly.  Influenza vaccine. / Every year.  Tetanus, diphtheria, and acellular pertussis (Tdap/Td) vaccine.** / Consult your health care provider. Pregnant women should receive 1 dose of Tdap vaccine during each pregnancy. 1 dose of Td every 10 years.  Varicella vaccine.** / Consult your health care provider. Pregnant females who do not have evidence of immunity should receive the first dose after pregnancy.  Zoster vaccine.** / 1 dose for adults aged 30 years or older.  Measles, mumps, rubella (MMR) vaccine.** / You need at least 1 dose of MMR if you were born in 1957 or later. You may also need a second dose. For females of childbearing age, rubella immunity should be determined. If there is no evidence of immunity, females who are not pregnant should be vaccinated. If there is no evidence of immunity, females who are pregnant should delay immunization until after pregnancy.  Pneumococcal 13-valent conjugate (PCV13) vaccine.** / Consult your health care provider.  Pneumococcal polysaccharide (PPSV23) vaccine.** / 1 to 2 doses if you smoke cigarettes or if you have certain conditions.  Meningococcal vaccine.** /  Consult your health care provider.  Hepatitis A vaccine.** / Consult your health care provider.  Hepatitis B vaccine.** / Consult your health care provider.  Haemophilus influenzae type b (Hib) vaccine.** / Consult your health care provider. Ages 64 years and over  Blood pressure check.** / Every year.  Lipid and cholesterol check.** / Every 5 years beginning at age 23 years.  Lung cancer screening. / Every year if you  are aged 16-80 years and have a 30-pack-year history of smoking and currently smoke or have quit within the past 15 years. Yearly screening is stopped once you have quit smoking for at least 15 years or develop a health problem that would prevent you from having lung cancer treatment.  Clinical breast exam.** / Every year after age 74 years.  BRCA-related cancer risk assessment.** / For women who have family members with a BRCA-related cancer (breast, ovarian, tubal, or peritoneal cancers).  Mammogram.** / Every year beginning at age 44 years and continuing for as long as you are in good health. Consult with your health care provider.  Pap test.** / Every 3 years starting at age 58 years through age 22 or 39 years with 3 consecutive normal Pap tests. Testing can be stopped between 65 and 70 years with 3 consecutive normal Pap tests and no abnormal Pap or HPV tests in the past 10 years.  HPV screening.** / Every 3 years from ages 64 years through ages 70 or 61 years with a history of 3 consecutive normal Pap tests. Testing can be stopped between 65 and 70 years with 3 consecutive normal Pap tests and no abnormal Pap or HPV tests in the past 10 years.  Fecal occult blood test (FOBT) of stool. / Every year beginning at age 40 years and continuing until age 27 years. You may not need to do this test if you get a colonoscopy every 10 years.  Flexible sigmoidoscopy or colonoscopy.** / Every 5 years for a flexible sigmoidoscopy or every 10 years for a colonoscopy beginning at age 7 years and continuing until age 32 years.  Hepatitis C blood test.** / For all people born from [birth year] through 1964 and any individual with known risks for hepatitis C.  Osteoporosis screening.** / A one-time screening for women ages 30 years and over and women at risk for fractures or osteoporosis.  Skin self-exam. / Monthly.  Influenza vaccine. / Every year.  Tetanus, diphtheria, and acellular pertussis (Tdap/Td)  vaccine.** / 1 dose of Td every 10 years.  Varicella vaccine.** / Consult your health care provider.  Zoster vaccine.** / 1 dose for adults aged 35 years or older.  Pneumococcal 13-valent conjugate (PCV13) vaccine.** / Consult your health care provider.  Pneumococcal polysaccharide (PPSV23) vaccine.** / 1 dose for all adults aged 46 years and older.  Meningococcal vaccine.** / Consult your health care provider.  Hepatitis A vaccine.** / Consult your health care provider.  Hepatitis B vaccine.** / Consult your health care provider.  Haemophilus influenzae type b (Hib) vaccine.** / Consult your health care provider. ** Family history and personal history of risk and conditions may change your health care provider's recommendations.   This information is not intended to replace advice given to you by your health care provider. Make sure you discuss any questions you have with your health care provider.   Document Released: 09/15/2001 Document Revised: 08/10/2014 Document Reviewed: 12/15/2010 Elsevier Interactive Patient Education [employer].

## 2015-11-16 LAB — PAP LB, CT-NG, RFX HPV ASCU
Chlamydia, Nuc. Acid Amp: NEGATIVE
GONOCOCCUS, NUC. ACID AMP: NEGATIVE
PAP Smear Comment: 0

## 2015-11-18 ENCOUNTER — Telehealth: Payer: Self-pay | Admitting: *Deleted

## 2015-11-18 NOTE — Telephone Encounter (Signed)
-----   Message from Reva Boresanya S Pratt, MD sent at 11/16/2015 10:54 AM EDT ----- Abnl pap-needs colpo

## 2015-11-18 NOTE — Telephone Encounter (Signed)
Informed pt of pap result and recommendation for Colpo.  Scheduled Colpo for 12-10-15 at 1:15 with Dr Shawnie PonsPratt.

## 2015-12-10 ENCOUNTER — Ambulatory Visit (INDEPENDENT_AMBULATORY_CARE_PROVIDER_SITE_OTHER): Payer: 59 | Admitting: Family Medicine

## 2015-12-10 ENCOUNTER — Encounter: Payer: Self-pay | Admitting: Family Medicine

## 2015-12-10 ENCOUNTER — Encounter: Payer: Self-pay | Admitting: *Deleted

## 2015-12-10 VITALS — BP 106/70 | HR 85 | Resp 18 | Ht 67.0 in | Wt 174.0 lb

## 2015-12-10 DIAGNOSIS — R87612 Low grade squamous intraepithelial lesion on cytologic smear of cervix (LGSIL): Secondary | ICD-10-CM

## 2015-12-10 DIAGNOSIS — Z01812 Encounter for preprocedural laboratory examination: Secondary | ICD-10-CM

## 2015-12-10 DIAGNOSIS — IMO0002 Reserved for concepts with insufficient information to code with codable children: Secondary | ICD-10-CM

## 2015-12-10 LAB — POCT URINE PREGNANCY: Preg Test, Ur: NEGATIVE

## 2015-12-10 NOTE — Assessment & Plan Note (Signed)
S/p colpo today--manage according to ASCCP guidelines.

## 2015-12-10 NOTE — Progress Notes (Signed)
    Subjective:    Patient ID: Julia Levine is a 30 y.o. female presenting with Colposcopy  on 12/10/2015  HPI: Here for colposcopy. LGSIL on pap.  Review of Systems  Constitutional: Negative for fever and chills.  Respiratory: Negative for shortness of breath.   Cardiovascular: Negative for chest pain.  Gastrointestinal: Negative for nausea, vomiting and abdominal pain.  Genitourinary: Negative for dysuria.  Skin: Negative for rash.      Objective:    BP 106/70 mmHg  Pulse 85  Resp 18  Ht 5\' 7"  (1.702 m)  Wt 174 lb (78.926 kg)  BMI 27.25 kg/m2  LMP 11/30/2015 Physical Exam  Constitutional: She appears well-developed and well-nourished. No distress.  Pulmonary/Chest: Effort normal.   Procedure: Patient given informed consent, signed copy in the chart, time out was performed.  Placed in lithotomy position. Cervix viewed with speculum and colposcope after application of acetic acid.   Colposcopy adequate?  yes Acetowhite lesions?yes inferior lip of cervix Punctation?yes Mosaicism?  no Abnormal vasculature?  no Biopsies?4, 8, 11 o'clock ECC?yes  COMMENTS: Patient was given post procedure instructions.  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      Relevant Orders   Pathology    Other Visit Diagnoses    Pre-procedure lab exam    -  Primary    Relevant Orders    POCT urine pregnancy (Completed)       Follow up based on results Gibson Telleria S 12/10/2015 1:40 PM

## 2015-12-10 NOTE — Patient Instructions (Signed)
Colposcopy  Colposcopy is a procedure to examine your cervix and vagina, or the area around the outside of your vagina, for abnormalities or signs of disease. The procedure is done using a lighted microscope called a colposcope. Tissue samples may be collected during the colposcopy if your health care provider finds any unusual cells. A colposcopy may be done if a woman has:  · An abnormal Pap test. A Pap test is a medical test done to evaluate cells that are on the surface of the cervix.  · A Pap test result that is suggestive of human papillomavirus (HPV). This virus can cause genital warts and is linked to the development of cervical cancer.  · A sore on her cervix and the results of a Pap test were normal.  · Genital warts on the cervix or in or around the outside of the vagina.  · A mother who took the drug diethylstilbestrol (DES) while pregnant.  · Painful intercourse.  · Vaginal bleeding, especially after sexual intercourse.  LET YOUR HEALTH CARE PROVIDER KNOW ABOUT:  · Any allergies you have.  · All medicines you are taking, including vitamins, herbs, eye drops, creams, and over-the-counter medicines.  · Previous problems you or members of your family have had with the use of anesthetics.  · Any blood disorders you have.  · Previous surgeries you have had.  · Medical conditions you have.  RISKS AND COMPLICATIONS  Generally, a colposcopy is a safe procedure. However, as with any procedure, complications can occur. Possible complications include:  · Bleeding.  · Infection.  · Missed lesions.  BEFORE THE PROCEDURE   · Tell your health care provider if you have your menstrual period. A colposcopy typically is not done during menstruation.  · For 24 hours before the colposcopy, do not:    [place].    Use tampons.    Use medicines, creams, or suppositories in the vagina.    Have sexual intercourse.  PROCEDURE   During the procedure, you will be lying on your back with your feet in foot rests (stirrups). A warm  metal or plastic instrument (speculum) will be placed in your vagina to keep it open and to allow the health care provider to see the cervix. The colposcope will be placed outside the vagina. It will be used to magnify and examine the cervix, vagina, and the area around the outside of the vagina. A small amount of liquid solution will be placed on the area that is to be viewed. This solution will make it easier to see the abnormal cells. Your health care provider will use tools to suck out mucus and cells from the canal of the cervix. Then he or she will record the location of the abnormal areas.  If a biopsy is done during the procedure, a medicine will usually be given to numb the area (local anesthetic). You may feel mild pain or cramping while the biopsy is done. After the procedure, tissue samples collected during the biopsy will be sent to a lab for analysis.  AFTER THE PROCEDURE   You will be given instructions on when to follow up with your health care provider for your test results. It is important to keep your appointment.     This information is not intended to replace advice given to you by your health care provider. Make sure you discuss any questions you have with your health care provider.     Document Released: 10/10/2002 Document Revised: 03/22/2013 Document Reviewed: 02/16/2013    Elsevier Interactive Patient Education ©2016 Elsevier Inc.

## 2015-12-13 LAB — PATHOLOGY

## 2016-01-07 ENCOUNTER — Ambulatory Visit (INDEPENDENT_AMBULATORY_CARE_PROVIDER_SITE_OTHER): Payer: 59 | Admitting: Family Medicine

## 2016-01-07 ENCOUNTER — Encounter: Payer: Self-pay | Admitting: *Deleted

## 2016-01-07 ENCOUNTER — Encounter: Payer: Self-pay | Admitting: Family Medicine

## 2016-01-07 VITALS — BP 106/59 | HR 76 | Resp 15 | Ht 67.0 in | Wt 174.8 lb

## 2016-01-07 DIAGNOSIS — N871 Moderate cervical dysplasia: Secondary | ICD-10-CM | POA: Diagnosis not present

## 2016-01-07 DIAGNOSIS — Z01812 Encounter for preprocedural laboratory examination: Secondary | ICD-10-CM

## 2016-01-07 LAB — POCT URINE PREGNANCY: PREG TEST UR: NEGATIVE

## 2016-01-07 NOTE — Patient Instructions (Signed)
Cryoablation Cryoablation is used to remove abnormal or cancerous tissue by freezing. A probe cooled with liquid nitrogen is directed beneath the skin to the growth. The growth is frozen and destroyed. During cryoablation, liquid nitrogen or argon gas flows into a needlelike applicator (cryoprobe), creating intense cold that is placed in contact with the diseased tissue. Imaging techniques such as an ultrasound, CT scans, or an MRI may be used to help guide the cryoprobes to the proper location inside the body. LET YOUR HEALTH CARE PROVIDER KNOW ABOUT:   Any allergies you have.   All medicines you are taking, including vitamins, herbs, eye drops, creams, and over-the-counter medicines.  Previous problems you or members of your family have had with the use of anesthetics.  Any blood disorders you have.  Previous surgeries you have had.  Medical conditions you have.  RISKS AND COMPLICATIONS  Generally, this is a safe procedure. However, as with any procedure, complications can occur. Possible complications include:   Infection.  Bleeding.  Tumor is not completely destroyed or the tumor comes back (recurrence). BEFORE THE PROCEDURE   You may need to have blood tests. These tests can help tell how well your kidneys and liver are working. They can also show how well your blood clots.   If you take blood thinners, ask your health care provider when you should stop taking them.   Make arrangements for someone to drive you home. Depending on the procedure, you may be able to go home the same day. However, most people stay overnight in the hospital after this procedure. Ask your health care provider what to expect.  PROCEDURE  This procedure usually takes about 1 to 3 hours.  You will lie on an exam table. You will be connected to monitors that keep track of your heart rate, blood pressure, and breathing throughout the procedure.  An IV access tube will be placed in one of your  veins. You may be given any of the following:  A medicine that makes you sleep through the procedure (general anesthetic).  A medicine to relax you (sedative).  A medicine to numb the area (local anesthetic).   For diseased tissue located deep in the body, your health care provider will use image guidance to insert one or more cryoprobes through the skin to the site of the diseased tissue. The liquid nitrogen or argon gas will then be delivered.  To cause cell death of the diseased tissue, the tissue is repeatedly frozen and thawed. Typically, two freeze-thaw cycles are used. Once the cells are destroyed, the white blood cells of the immune system will work to clear out the dead tissue. AFTER THE PROCEDURE   If you were given a sedative, you will be sleepy.  If you were put under general anesthesia, your throat may be sore after you wake up. This is caused by the breathing tube that was placed in your throat while you were asleep.  You may be allowed to go home when you are awake and able to drink fluids, or you may need to stay in the hospital overnight.  The puncture site will be sore. This is normal. The soreness will go away in 3-5 days. Some minor bruising may also be present.  You may have some mild abdominal, flank, or right shoulder pain for 24 hours.   This information is not intended to replace advice given to you by your health care provider. Make sure you discuss any questions you have with your   health care provider.   Document Released: 05/10/2013 Document Reviewed: 05/10/2013 Elsevier Interactive Patient Education 2016 Elsevier Inc.  

## 2016-01-07 NOTE — Progress Notes (Signed)
    Subjective:    Patient ID: Julia Levine is a 30 y.o. female presenting with Procedure  on 01/07/2016  HPI: Here for Cryo--CIN2 on pap. Has not completed childbearing and has h/o PTL with loss of 23 wk twins.  Review of Systems  Constitutional: Negative for fever and chills.  Respiratory: Negative for shortness of breath.   Cardiovascular: Negative for chest pain.  Gastrointestinal: Negative for nausea, vomiting and abdominal pain.  Genitourinary: Negative for dysuria.  Skin: Negative for rash.      Objective:    BP 106/59 mmHg  Pulse 76  Resp 15  Ht 5\' 7"  (1.702 m)  Wt 174 lb 12.8 oz (79.289 kg)  BMI 27.37 kg/m2  LMP 11/30/2015 Physical Exam  Constitutional: She is oriented to person, place, and time. She appears well-developed and well-nourished. No distress.  HENT:  Head: Normocephalic and atraumatic.  Eyes: No scleral icterus.  Neck: Neck supple.  Cardiovascular: Normal rate.   Pulmonary/Chest: Effort normal.  Abdominal: Soft.  Genitourinary: Vagina normal. No vaginal discharge found.  Neurological: She is alert and oriented to person, place, and time.  Skin: Skin is warm and dry.  Psychiatric: She has a normal mood and affect.    Procedure:  The patient was placed in the dorsal lithotomy position and a vaginal speculum was placed. Her cervix was visualized and patient was noted to have had normal size transformation zone. The appropriate cryotherapy probe was picked and affixed to cryotherapy apparatus. Then nitrogen gas was then activated, the probe was coated with lubricating jelly and applied to the transformation zone of the cervix. This was kept in place for 3 minutes. The cryotherapy was then stopped and all instruments were removed from the patient's pelvis; a thawing period of 3 minutes was observed.  A second cycle of cryotherapy was then administered to the cervix for 3 minutes.  The patient tolerated the procedure well without any  complications. Routine post procedure instructions were given to the patient.  Will repeat pap smear in 6 months and manage accordingly.        Assessment & Plan:   Problem List Items Addressed This Visit      Unprioritized   Dysplasia of cervix, high grade CIN 2 - Primary   Relevant Orders   POCT urine pregnancy (Completed)     S/p cryo   Return in about 6 months (around 07/08/2016) for repeat pap.  Maronda Caison S 01/07/2016 9:41 AM

## 2016-12-09 ENCOUNTER — Telehealth: Payer: Self-pay | Admitting: *Deleted

## 2016-12-09 ENCOUNTER — Other Ambulatory Visit (INDEPENDENT_AMBULATORY_CARE_PROVIDER_SITE_OTHER): Payer: 59 | Admitting: *Deleted

## 2016-12-09 DIAGNOSIS — N898 Other specified noninflammatory disorders of vagina: Secondary | ICD-10-CM | POA: Diagnosis not present

## 2016-12-09 NOTE — Progress Notes (Signed)
SUBJECTIVE:  31 y.o. female complains of Vaginal Odor. Denies abnormal vaginal bleeding, vaginal discharge or significant pelvic pain or fever. No UTI symptoms. Denies history of known exposure to STD.  No LMP recorded. Patient is not currently having periods (Reason: Oral contraceptives).  OBJECTIVE:  She appears well, afebrile.   ASSESSMENT:  Vaginal Odor   PLAN:  NuSwab Vaginitis Plus  sent to lab. Treatment: To be determined once lab results are received ROV prn if symptoms persist or worsen.

## 2016-12-09 NOTE — Telephone Encounter (Signed)
Pt called in stating she thought she had a yeast infection but Sx resolved and now has vaginal odor X2 days. Pt wanted to come in for Self-Swab. Appt made.

## 2016-12-12 LAB — NUSWAB VAGINITIS PLUS (VG+)
Atopobium vaginae: HIGH {score} — AB
BVAB 2: HIGH {score} — AB
Candida albicans, NAA: NEGATIVE
Candida glabrata, NAA: NEGATIVE
Chlamydia trachomatis, NAA: NEGATIVE
Megasphaera 1: HIGH {score} — AB
Neisseria gonorrhoeae, NAA: NEGATIVE
Trich vag by NAA: NEGATIVE

## 2016-12-14 ENCOUNTER — Telehealth: Payer: Self-pay | Admitting: *Deleted

## 2016-12-14 DIAGNOSIS — B9689 Other specified bacterial agents as the cause of diseases classified elsewhere: Secondary | ICD-10-CM

## 2016-12-14 DIAGNOSIS — N760 Acute vaginitis: Principal | ICD-10-CM

## 2016-12-14 MED ORDER — METRONIDAZOLE 500 MG PO TABS: 500.0000 mg | ORAL_TABLET | Freq: Two times a day (BID) | ORAL | 0 refills | Status: DC

## 2016-12-14 NOTE — Telephone Encounter (Signed)
Informed pt of results and treatment, instructed pt on medication use.

## 2016-12-14 NOTE — Telephone Encounter (Signed)
-----   Message from Reva Boresanya S Pratt, MD sent at 12/14/2016  8:06 AM EDT ----- Needs Flagyl 500 mg po bid x 7 day

## 2016-12-15 ENCOUNTER — Encounter: Payer: Self-pay | Admitting: Family Medicine

## 2016-12-15 ENCOUNTER — Ambulatory Visit (INDEPENDENT_AMBULATORY_CARE_PROVIDER_SITE_OTHER): Payer: 59 | Admitting: Family Medicine

## 2016-12-15 VITALS — BP 106/71 | HR 92 | Resp 18 | Ht 67.0 in | Wt 164.0 lb

## 2016-12-15 DIAGNOSIS — Z124 Encounter for screening for malignant neoplasm of cervix: Secondary | ICD-10-CM

## 2016-12-15 DIAGNOSIS — Z30011 Encounter for initial prescription of contraceptive pills: Secondary | ICD-10-CM

## 2016-12-15 DIAGNOSIS — Z01419 Encounter for gynecological examination (general) (routine) without abnormal findings: Secondary | ICD-10-CM

## 2016-12-15 DIAGNOSIS — N871 Moderate cervical dysplasia: Secondary | ICD-10-CM

## 2016-12-15 DIAGNOSIS — Z30432 Encounter for removal of intrauterine contraceptive device: Secondary | ICD-10-CM

## 2016-12-15 DIAGNOSIS — Z1151 Encounter for screening for human papillomavirus (HPV): Secondary | ICD-10-CM | POA: Diagnosis not present

## 2016-12-15 MED ORDER — LEVONORGEST-ETH ESTRAD 91-DAY 0.15-0.03 &0.01 MG PO TABS: 1.0000 | ORAL_TABLET | Freq: Every day | ORAL | 4 refills | Status: DC

## 2016-12-15 NOTE — Progress Notes (Signed)
   Subjective:     Julia Levine is a 31 y.o. female and is here for a comprehensive physical exam. The patient reports no problems.  Social History   Social History  . Marital status: Single    Spouse name: N/A  . Number of children: N/A  . Years of education: N/A   Occupational History  . Not on file.   Social History Main Topics  . Smoking status: Never Smoker  . Smokeless tobacco: Never Used  . Alcohol use No  . Drug use: No  . Sexual activity: Yes    Partners: Male    Birth control/ protection: Pill   Other Topics Concern  . Not on file   Social History Narrative  . No narrative on file   Health Maintenance  Topic Date Due  . INFLUENZA VACCINE  03/03/2017  . PAP SMEAR  11/11/2018  . TETANUS/TDAP  10/24/2023  . HIV Screening  Completed    The following portions of the patient's history were reviewed and updated as appropriate: allergies, current medications, past family history, past medical history, past social history, past surgical history and problem list.  Review of Systems Pertinent items noted in HPI and remainder of comprehensive ROS otherwise negative.   Objective:    BP 106/71 (BP Location: Left Arm, Patient Position: Sitting, Cuff Size: Normal)   Pulse 92   Resp 18   Ht 5\' 7"  (1.702 m)   Wt 164 lb (74.4 kg)   LMP 10/12/2016   BMI 25.69 kg/m  General appearance: alert, cooperative and appears stated age Head: Normocephalic, without obvious abnormality, atraumatic Neck: no adenopathy, supple, symmetrical, trachea midline and thyroid not enlarged, symmetric, no tenderness/mass/nodules Lungs: clear to auscultation bilaterally Breasts: normal appearance, no masses or tenderness Heart: regular rate and rhythm, S1, S2 normal, no murmur, click, rub or gallop Abdomen: soft, non-tender; bowel sounds normal; no masses,  no organomegaly Pelvic: cervix normal in appearance, external genitalia normal, no adnexal masses or tenderness, no cervical  motion tenderness, uterus normal size, shape, and consistency and vagina normal without discharge Extremities: no edema, redness or tenderness in the calves or thighs Pulses: 2+ and symmetric Skin: Skin color, texture, turgor normal. No rashes or lesions Lymph nodes: Cervical, supraclavicular, and axillary nodes normal. Neurologic: Grossly normal    Assessment:    Healthy female exam.      Plan:      Problem List Items Addressed This Visit      Unprioritized   Dysplasia of cervix, high grade CIN 2 - Primary   Relevant Orders   IGP, Aptima HPV, rfx 16/18,45    Other Visit Diagnoses    Screening for malignant neoplasm of cervix       Relevant Orders   IGP, Aptima HPV, rfx 16/18,45   Encounter for gynecological examination without abnormal finding       Oral contraceptive prescribed       Relevant Medications   Levonorgestrel-Ethinyl Estradiol (AMETHIA,CAMRESE) 0.15-0.03 &0.01 MG tablet   Encounter for IUD removal       Relevant Medications   Levonorgestrel-Ethinyl Estradiol (AMETHIA,CAMRESE) 0.15-0.03 &0.01 MG tablet       annual labs with work  See After Visit Summary for Counseling Recommendations

## 2016-12-15 NOTE — Patient Instructions (Signed)
Preventive Care 18-39 Years, Female Preventive care refers to lifestyle choices and visits with your health care provider that can promote health and wellness. What does preventive care include?  A yearly physical exam. This is also called an annual well check.  Dental exams once or twice a year.  Routine eye exams. Ask your health care provider how often you should have your eyes checked.  Personal lifestyle choices, including:  Daily care of your teeth and gums.  Regular physical activity.  Eating a healthy diet.  Avoiding tobacco and drug use.  Limiting alcohol use.  Practicing safe sex.  Taking vitamin and mineral supplements as recommended by your health care provider. What happens during an annual well check? The services and screenings done by your health care provider during your annual well check will depend on your age, overall health, lifestyle risk factors, and family history of disease. Counseling  Your health care provider may ask you questions about your:  Alcohol use.  Tobacco use.  Drug use.  Emotional well-being.  Home and relationship well-being.  Sexual activity.  Eating habits.  Work and work environment.  Method of birth control.  Menstrual cycle.  Pregnancy history. Screening  You may have the following tests or measurements:  Height, weight, and BMI.  Diabetes screening. This is done by checking your blood sugar (glucose) after you have not eaten for a while (fasting).  Blood pressure.  Lipid and cholesterol levels. These may be checked every 5 years starting at age 20.  Skin check.  Hepatitis C blood test.  Hepatitis B blood test.  Sexually transmitted disease (STD) testing.  BRCA-related cancer screening. This may be done if you have a family history of breast, ovarian, tubal, or peritoneal cancers.  Pelvic exam and Pap test. This may be done every 3 years starting at age 21. Starting at age 30, this may be done every 5  years if you have a Pap test in combination with an HPV test. Discuss your test results, treatment options, and if necessary, the need for more tests with your health care provider. Vaccines  Your health care provider may recommend certain vaccines, such as:  Influenza vaccine. This is recommended every year.  Tetanus, diphtheria, and acellular pertussis (Tdap, Td) vaccine. You may need a Td booster every 10 years.  Varicella vaccine. You may need this if you have not been vaccinated.  HPV vaccine. If you are 26 or younger, you may need three doses over 6 months.  Measles, mumps, and rubella (MMR) vaccine. You may need at least one dose of MMR. You may also need a second dose.  Pneumococcal 13-valent conjugate (PCV13) vaccine. You may need this if you have certain conditions and were not previously vaccinated.  Pneumococcal polysaccharide (PPSV23) vaccine. You may need one or two doses if you smoke cigarettes or if you have certain conditions.  Meningococcal vaccine. One dose is recommended if you are age 19-21 years and a first-year college student living in a residence hall, or if you have one of several medical conditions. You may also need additional booster doses.  Hepatitis A vaccine. You may need this if you have certain conditions or if you travel or work in places where you may be exposed to hepatitis A.  Hepatitis B vaccine. You may need this if you have certain conditions or if you travel or work in places where you may be exposed to hepatitis B.  Haemophilus influenzae type b (Hib) vaccine. You may need this   if you have certain risk factors. Talk to your health care provider about which screenings and vaccines you need and how often you need them. This information is not intended to replace advice given to you by your health care provider. Make sure you discuss any questions you have with your health care provider. Document Released: 09/15/2001 Document Revised: 04/08/2016  Document Reviewed: 05/21/2015 Elsevier Interactive Patient Education  2017 [employer].

## 2016-12-16 ENCOUNTER — Telehealth: Payer: Self-pay | Admitting: *Deleted

## 2016-12-16 NOTE — Addendum Note (Signed)
Addended by: Arne ClevelandHUTCHINSON, MANDY J on: 12/16/2016 08:26 AM   Modules accepted: Orders

## 2016-12-16 NOTE — Telephone Encounter (Signed)
[facility] called to let us know she received pt pap smear but was [facility]. They didn't see this until the pap was already on the machine to run. Corrie DandyMary is to see if we can do it for no charge and advise pt.  [facility] pap order canceled and added [facility] pap order.

## 2016-12-17 LAB — CYTOLOGY - PAP
Diagnosis: NEGATIVE
HPV (WINDOPATH): NOT DETECTED

## 2016-12-31 ENCOUNTER — Other Ambulatory Visit: Payer: Self-pay | Admitting: Obstetrics & Gynecology

## 2016-12-31 DIAGNOSIS — Z30011 Encounter for initial prescription of contraceptive pills: Secondary | ICD-10-CM

## 2016-12-31 DIAGNOSIS — Z30432 Encounter for removal of intrauterine contraceptive device: Secondary | ICD-10-CM

## 2017-10-18 ENCOUNTER — Encounter: Payer: Self-pay | Admitting: Radiology

## 2018-02-18 NOTE — Progress Notes (Deleted)
LAST 12/16/2017- NEGATIVE

## 2018-02-22 ENCOUNTER — Ambulatory Visit: Payer: 59 | Admitting: Family Medicine

## 2018-02-28 NOTE — Progress Notes (Signed)
Last pap 12/2016- negative

## 2018-03-01 ENCOUNTER — Other Ambulatory Visit (HOSPITAL_COMMUNITY)
Admission: RE | Admit: 2018-03-01 | Discharge: 2018-03-01 | Disposition: A | Discharge status: Home / Self Care | Payer: Managed Care, Other (non HMO) | Source: Ambulatory Visit | Attending: Family Medicine | Admitting: Family Medicine

## 2018-03-01 ENCOUNTER — Ambulatory Visit (INDEPENDENT_AMBULATORY_CARE_PROVIDER_SITE_OTHER): Payer: Medicaid Other | Admitting: Family Medicine

## 2018-03-01 VITALS — BP 111/70 | HR 81 | Resp 16 | Ht 67.0 in | Wt 177.6 lb

## 2018-03-01 DIAGNOSIS — Z124 Encounter for screening for malignant neoplasm of cervix: Secondary | ICD-10-CM

## 2018-03-01 DIAGNOSIS — Z0000 Encounter for general adult medical examination without abnormal findings: Secondary | ICD-10-CM

## 2018-03-01 DIAGNOSIS — Z30011 Encounter for initial prescription of contraceptive pills: Secondary | ICD-10-CM

## 2018-03-01 MED ORDER — LEVONORGEST-ETH ESTRAD 91-DAY 0.15-0.03 &0.01 MG PO TABS: 1.0000 | ORAL_TABLET | Freq: Every day | ORAL | 4 refills | Status: DC

## 2018-03-01 NOTE — Progress Notes (Signed)
  Subjective:     Julia Levine is a 32 y.o. female and is here for a comprehensive physical exam. The patient reports no problems On OC's, doing well. Does not know if she wants any more kids.   The following portions of the patient's history were reviewed and updated as appropriate: allergies, current medications, past family history, past medical history, past social history, past surgical history and problem list.  Review of Systems Pertinent items noted in HPI and remainder of comprehensive ROS otherwise negative.   Objective:    BP 111/70 (BP Location: Left Arm, Patient Position: Sitting, Cuff Size: Large)   Pulse 81   Resp 16   Ht 5\' 7"  (1.702 m)   Wt 177 lb 9.6 oz (80.6 kg)   LMP 02/17/2018 (Exact Date)   BMI 27.82 kg/m  General appearance: alert, cooperative and appears stated age Head: Normocephalic, without obvious abnormality, atraumatic Neck: no adenopathy, supple, symmetrical, trachea midline and thyroid not enlarged, symmetric, no tenderness/mass/nodules Lungs: clear to auscultation bilaterally Breasts: normal appearance, no masses or tenderness Heart: regular rate and rhythm, S1, S2 normal, no murmur, click, rub or gallop Abdomen: soft, non-tender; bowel sounds normal; no masses,  no organomegaly Pelvic: cervix normal in appearance, external genitalia normal, no adnexal masses or tenderness, no cervical motion tenderness, uterus normal size, shape, and consistency and vagina normal without discharge Extremities: extremities normal, atraumatic, no cyanosis or edema Pulses: 2+ and symmetric Skin: Skin color, texture, turgor normal. No rashes or lesions Lymph nodes: Cervical, supraclavicular, and axillary nodes normal. Neurologic: Grossly normal    Assessment:    Healthy female exam.      Plan:   Problem List Items Addressed This Visit    None    Visit Diagnoses    Screening for cervical cancer    -  Primary   Relevant Orders   Cytology - PAP   Oral contraceptive prescribed       Relevant Medications   Levonorgestrel-Ethinyl Estradiol (AMETHIA,CAMRESE) 0.15-0.03 &0.01 MG tablet         See After Visit Summary for Counseling Recommendations

## 2018-03-01 NOTE — Patient Instructions (Signed)
Preventive Care 18-39 Years, Female Preventive care refers to lifestyle choices and visits with your health care provider that can promote health and wellness. What does preventive care include?  A yearly physical exam. This is also called an annual well check.  Dental exams once or twice a year.  Routine eye exams. Ask your health care provider how often you should have your eyes checked.  Personal lifestyle choices, including: ? Daily care of your teeth and gums. ? Regular physical activity. ? Eating a healthy diet. ? Avoiding tobacco and drug use. ? Limiting alcohol use. ? Practicing safe sex. ? Taking vitamin and mineral supplements as recommended by your health care provider. What happens during an annual well check? The services and screenings done by your health care provider during your annual well check will depend on your age, overall health, lifestyle risk factors, and family history of disease. Counseling Your health care provider may ask you questions about your:  Alcohol use.  Tobacco use.  Drug use.  Emotional well-being.  Home and relationship well-being.  Sexual activity.  Eating habits.  Work and work Statistician.  Method of birth control.  Menstrual cycle.  Pregnancy history.  Screening You may have the following tests or measurements:  Height, weight, and BMI.  Diabetes screening. This is done by checking your blood sugar (glucose) after you have not eaten for a while (fasting).  Blood pressure.  Lipid and cholesterol levels. These may be checked every 5 years starting at age 38.  Skin check.  Hepatitis C blood test.  Hepatitis B blood test.  Sexually transmitted disease (STD) testing.  BRCA-related cancer screening. This may be done if you have a family history of breast, ovarian, tubal, or peritoneal cancers.  Pelvic exam and Pap test. This may be done every 3 years starting at age 38. Starting at age 30, this may be done  every 5 years if you have a Pap test in combination with an HPV test.  Discuss your test results, treatment options, and if necessary, the need for more tests with your health care provider. Vaccines Your health care provider may recommend certain vaccines, such as:  Influenza vaccine. This is recommended every year.  Tetanus, diphtheria, and acellular pertussis (Tdap, Td) vaccine. You may need a Td booster every 10 years.  Varicella vaccine. You may need this if you have not been vaccinated.  HPV vaccine. If you are 39 or younger, you may need three doses over 6 months.  Measles, mumps, and rubella (MMR) vaccine. You may need at least one dose of MMR. You may also need a second dose.  Pneumococcal 13-valent conjugate (PCV13) vaccine. You may need this if you have certain conditions and were not previously vaccinated.  Pneumococcal polysaccharide (PPSV23) vaccine. You may need one or two doses if you smoke cigarettes or if you have certain conditions.  Meningococcal vaccine. One dose is recommended if you are age 68-21 years and a first-year college student living in a residence hall, or if you have one of several medical conditions. You may also need additional booster doses.  Hepatitis A vaccine. You may need this if you have certain conditions or if you travel or work in places where you may be exposed to hepatitis A.  Hepatitis B vaccine. You may need this if you have certain conditions or if you travel or work in places where you may be exposed to hepatitis B.  Haemophilus influenzae type b (Hib) vaccine. You may need this  if you have certain risk factors.  Talk to your health care provider about which screenings and vaccines you need and how often you need them. This information is not intended to replace advice given to you by your health care provider. Make sure you discuss any questions you have with your health care provider. Document Released: 09/15/2001 Document Revised:  04/08/2016 Document Reviewed: 05/21/2015 Elsevier Interactive Patient Education  2018 Elsevier Inc.  

## 2018-03-03 LAB — CYTOLOGY - PAP
Diagnosis: NEGATIVE
HPV: NOT DETECTED

## 2019-02-15 ENCOUNTER — Encounter: Payer: Self-pay | Admitting: Radiology

## 2019-03-13 ENCOUNTER — Ambulatory Visit: Payer: 59 | Admitting: Family Medicine

## 2019-04-24 ENCOUNTER — Other Ambulatory Visit (HOSPITAL_COMMUNITY)
Admission: RE | Admit: 2019-04-24 | Discharge: 2019-04-24 | Disposition: A | Discharge status: Home / Self Care | Payer: Managed Care, Other (non HMO) | Source: Ambulatory Visit | Attending: Family Medicine | Admitting: Family Medicine

## 2019-04-24 ENCOUNTER — Other Ambulatory Visit: Payer: Self-pay | Admitting: Family Medicine

## 2019-04-24 ENCOUNTER — Other Ambulatory Visit: Payer: Self-pay

## 2019-04-24 ENCOUNTER — Ambulatory Visit (INDEPENDENT_AMBULATORY_CARE_PROVIDER_SITE_OTHER): Payer: Managed Care, Other (non HMO) | Admitting: Family Medicine

## 2019-04-24 ENCOUNTER — Encounter: Payer: Self-pay | Admitting: Family Medicine

## 2019-04-24 VITALS — BP 112/71 | HR 72 | Wt 191.0 lb

## 2019-04-24 DIAGNOSIS — Z113 Encounter for screening for infections with a predominantly sexual mode of transmission: Secondary | ICD-10-CM

## 2019-04-24 DIAGNOSIS — Z124 Encounter for screening for malignant neoplasm of cervix: Secondary | ICD-10-CM | POA: Diagnosis not present

## 2019-04-24 DIAGNOSIS — Z30011 Encounter for initial prescription of contraceptive pills: Secondary | ICD-10-CM

## 2019-04-24 DIAGNOSIS — Z01419 Encounter for gynecological examination (general) (routine) without abnormal findings: Secondary | ICD-10-CM | POA: Diagnosis not present

## 2019-04-24 MED ORDER — LEVONORGEST-ETH ESTRAD 91-DAY 0.15-0.03 &0.01 MG PO TABS: 1.0000 | ORAL_TABLET | Freq: Every day | ORAL | 4 refills | Status: DC

## 2019-04-24 NOTE — Patient Instructions (Signed)
Preventive Care 32-32 Years Old, Female Preventive care refers to visits with your health care provider and lifestyle choices that can promote health and wellness. This includes:  A yearly physical exam. This may also be called an annual well check.  Regular dental visits and eye exams.  Immunizations.  Screening for certain conditions.  Healthy lifestyle choices, such as eating a healthy diet, getting regular exercise, not using drugs or products that contain nicotine and tobacco, and limiting alcohol use. What can I expect for my preventive care visit? Physical exam Your health care provider will check your:  Height and weight. This may be used to calculate body mass index (BMI), which tells if you are at a healthy weight.  Heart rate and blood pressure.  Skin for abnormal spots. Counseling Your health care provider may ask you questions about your:  Alcohol, tobacco, and drug use.  Emotional well-being.  Home and relationship well-being.  Sexual activity.  Eating habits.  Work and work Statistician.  Method of birth control.  Menstrual cycle.  Pregnancy history. What immunizations do I need?  Influenza (flu) vaccine  This is recommended every year. Tetanus, diphtheria, and pertussis (Tdap) vaccine  You may need a Td booster every 10 years. Varicella (chickenpox) vaccine  You may need this if you have not been vaccinated. Human papillomavirus (HPV) vaccine  If recommended by your health care provider, you may need three doses over 6 months. Measles, mumps, and rubella (MMR) vaccine  You may need at least one dose of MMR. You may also need a second dose. Meningococcal conjugate (MenACWY) vaccine  One dose is recommended if you are age 32-21 years and a first-year college student living in a residence hall, or if you have one of several medical conditions. You may also need additional booster doses. Pneumococcal conjugate (PCV13) vaccine  You may need  this if you have certain conditions and were not previously vaccinated. Pneumococcal polysaccharide (PPSV23) vaccine  You may need one or two doses if you smoke cigarettes or if you have certain conditions. Hepatitis A vaccine  You may need this if you have certain conditions or if you travel or work in places where you may be exposed to hepatitis A. Hepatitis B vaccine  You may need this if you have certain conditions or if you travel or work in places where you may be exposed to hepatitis B. Haemophilus influenzae type b (Hib) vaccine  You may need this if you have certain conditions. You may receive vaccines as individual doses or as more than one vaccine together in one shot (combination vaccines). Talk with your health care provider about the risks and benefits of combination vaccines. What tests do I need?  Blood tests  Lipid and cholesterol levels. These may be checked every 5 years starting at age 32.  Hepatitis C test.  Hepatitis B test. Screening  Diabetes screening. This is done by checking your blood sugar (glucose) after you have not eaten for a while (fasting).  Sexually transmitted disease (STD) testing.  BRCA-related cancer screening. This may be done if you have a family history of breast, ovarian, tubal, or peritoneal cancers.  Pelvic exam and Pap test. This may be done every 3 years starting at age 32. Starting at age 32, this may be done every 5 years if you have a Pap test in combination with an HPV test. Talk with your health care provider about your test results, treatment options, and if necessary, the need for more  tests. Follow these instructions at home: Eating and drinking   Eat a diet that includes fresh fruits and vegetables, whole grains, lean protein, and low-fat dairy.  Take vitamin and mineral supplements as recommended by your health care provider.  Do not drink alcohol if: ? Your health care provider tells you not to drink. ? You are  pregnant, may be pregnant, or are planning to become pregnant.  If you drink alcohol: ? Limit how much you have to 0-1 drink a day. ? Be aware of how much alcohol is in your drink. In the [place] one drink equals one 12 oz bottle of beer (355 mL), one 5 oz glass of wine (148 mL), or one 1 oz glass of hard liquor (44 mL). Lifestyle  Take daily care of your teeth and gums.  Stay active. Exercise for at least 30 minutes on 5 or more days each week.  Do not use any products that contain nicotine or tobacco, such as cigarettes, e-cigarettes, and chewing tobacco. If you need help quitting, ask your health care provider.  If you are sexually active, practice safe sex. Use a condom or other form of birth control (contraception) in order to prevent pregnancy and STIs (sexually transmitted infections). If you plan to become pregnant, see your health care provider for a preconception visit. What's next?  Visit your health care provider once a year for a well check visit.  Ask your health care provider how often you should have your eyes and teeth checked.  Stay up to date on all vaccines. This information is not intended to replace advice given to you by your health care provider. Make sure you discuss any questions you have with your health care provider. Document Released: 09/15/2001 Document Revised: 03/31/2018 Document Reviewed: 03/31/2018 Elsevier Patient Education  2020 [employer].

## 2019-04-24 NOTE — Progress Notes (Signed)
Last pap 01/2018- normal  Sti testing or blood work? Yes Flu injection decline today

## 2019-04-24 NOTE — Progress Notes (Signed)
  Subjective:     Julia Levine is a 33 y.o. female and is here for a comprehensive physical exam. The patient reports no problems. On OC's, doing well. Wants STD testing.  The following portions of the patient's history were reviewed and updated as appropriate: allergies, current medications, past family history, past medical history, past social history, past surgical history and problem list.  Review of Systems Pertinent items noted in HPI and remainder of comprehensive ROS otherwise negative.   Objective:    BP 112/71   Pulse 72   Wt 191 lb (86.6 kg)   LMP 03/04/2019   BMI 29.91 kg/m  General appearance: alert, cooperative and appears stated age Head: Normocephalic, without obvious abnormality, atraumatic Neck: no adenopathy, supple, symmetrical, trachea midline and thyroid not enlarged, symmetric, no tenderness/mass/nodules Lungs: clear to auscultation bilaterally Breasts: normal appearance, no masses or tenderness Heart: regular rate and rhythm, S1, S2 normal, no murmur, click, rub or gallop Abdomen: soft, non-tender; bowel sounds normal; no masses,  no organomegaly Pelvic: cervix normal in appearance, external genitalia normal, no adnexal masses or tenderness, no cervical motion tenderness, uterus normal size, shape, and consistency and vagina normal without discharge Extremities: extremities normal, atraumatic, no cyanosis or edema Pulses: 2+ and symmetric Skin: Skin color, texture, turgor normal. No rashes or lesions Lymph nodes: Cervical, supraclavicular, and axillary nodes normal. Neurologic: Grossly normal    Assessment:    Healthy female exam.      Plan:   Problem List Items Addressed This Visit    None    Visit Diagnoses    Encounter for gynecological examination without abnormal finding    -  Primary   Relevant Orders   CBC   TSH   Comprehensive metabolic panel   Lipid panel   VITAMIN D 25 Hydroxy (Vit-D Deficiency, Fractures)   Oral  contraceptive prescribed       Relevant Medications   Levonorgestrel-Ethinyl Estradiol (AMETHIA) 0.15-0.03 &0.01 MG tablet   Screening for cervical cancer       Relevant Orders   Cytology - PAP( [place])   Screen for STD (sexually transmitted disease)       Relevant Orders   Cervicovaginal ancillary only( [place])   Hepatitis B surface antigen   Hepatitis C antibody   HIV Antibody (routine testing w rflx)   RPR     Return in 1 year (on 04/23/2020).    See After Visit Summary for Counseling Recommendations

## 2019-04-25 LAB — CERVICOVAGINAL ANCILLARY ONLY
Bacterial Vaginitis (gardnerella): POSITIVE — AB
Candida Glabrata: NEGATIVE
Candida Vaginitis: NEGATIVE
Molecular Disclaimer: NEGATIVE
Molecular Disclaimer: NEGATIVE
Molecular Disclaimer: NEGATIVE
Molecular Disclaimer: NORMAL
Trichomonas: NEGATIVE

## 2019-04-25 LAB — COMPREHENSIVE METABOLIC PANEL
ALT: 47 IU/L — ABNORMAL HIGH (ref 0–32)
AST: 26 IU/L (ref 0–40)
Albumin/Globulin Ratio: 1.8 (ref 1.2–2.2)
Albumin: 4.7 g/dL (ref 3.8–4.8)
Alkaline Phosphatase: 111 IU/L (ref 39–117)
BUN/Creatinine Ratio: 13 (ref 9–23)
BUN: 9 mg/dL (ref 6–20)
Bilirubin Total: 0.5 mg/dL (ref 0.0–1.2)
CO2: 23 mmol/L (ref 20–29)
Calcium: 9.4 mg/dL (ref 8.7–10.2)
Chloride: 100 mmol/L (ref 96–106)
Creatinine, Ser: 0.71 mg/dL (ref 0.57–1.00)
GFR calc Af Amer: 130 mL/min/{1.73_m2} (ref >59)
GFR calc non Af Amer: 113 mL/min/{1.73_m2} (ref >59)
Globulin, Total: 2.6 g/dL (ref 1.5–4.5)
Glucose: 80 mg/dL (ref 65–99)
Potassium: 3.9 mmol/L (ref 3.5–5.2)
Sodium: 141 mmol/L (ref 134–144)
Total Protein: 7.3 g/dL (ref 6.0–8.5)

## 2019-04-25 LAB — CBC
Hematocrit: 41.5 % (ref 34.0–46.6)
Hemoglobin: 14.2 g/dL (ref 11.1–15.9)
MCH: 32.6 pg (ref 26.6–33.0)
MCHC: 34.2 g/dL (ref 31.5–35.7)
MCV: 95 fL (ref 79–97)
Platelets: 184 10*3/uL (ref 150–450)
RBC: 4.35 x10E6/uL (ref 3.77–5.28)
RDW: 12.8 % (ref 11.7–15.4)
WBC: 7.6 10*3/uL (ref 3.4–10.8)

## 2019-04-25 LAB — RPR: RPR Ser Ql: NONREACTIVE

## 2019-04-25 LAB — HIV ANTIBODY (ROUTINE TESTING W REFLEX): HIV Screen 4th Generation wRfx: NONREACTIVE

## 2019-04-25 LAB — VITAMIN D 25 HYDROXY (VIT D DEFICIENCY, FRACTURES): Vit D, 25-Hydroxy: 26.7 ng/mL — ABNORMAL LOW (ref 30.0–100.0)

## 2019-04-25 LAB — HEPATITIS B SURFACE ANTIGEN: Hepatitis B Surface Ag: NEGATIVE

## 2019-04-25 LAB — HEPATITIS C ANTIBODY: Hep C Virus Ab: <0.1 s/co ratio (ref 0.0–0.9)

## 2019-04-25 LAB — LIPID PANEL
Chol/HDL Ratio: 3.1 ratio (ref 0.0–4.4)
Cholesterol, Total: 213 mg/dL — ABNORMAL HIGH (ref 100–199)
HDL: 68 mg/dL (ref >39)
LDL Chol Calc (NIH): 119 mg/dL — ABNORMAL HIGH (ref 0–99)
Triglycerides: 149 mg/dL (ref 0–149)
VLDL Cholesterol Cal: 26 mg/dL (ref 5–40)

## 2019-04-25 LAB — TSH: TSH: 1.56 u[IU]/mL (ref 0.450–4.500)

## 2019-04-26 LAB — CERVICOVAGINAL ANCILLARY ONLY
Chlamydia: NEGATIVE
Neisseria Gonorrhea: NEGATIVE
Trichomonas: NEGATIVE

## 2019-05-01 LAB — CYTOLOGY - PAP
Adequacy: ABSENT
Diagnosis: NEGATIVE
High risk HPV: NEGATIVE
Molecular Disclaimer: 56
Molecular Disclaimer: NORMAL

## 2019-07-19 ENCOUNTER — Other Ambulatory Visit: Payer: Managed Care, Other (non HMO)

## 2019-11-22 ENCOUNTER — Other Ambulatory Visit: Payer: Managed Care, Other (non HMO)

## 2020-02-13 ENCOUNTER — Ambulatory Visit: Payer: Managed Care, Other (non HMO) | Admitting: Obstetrics and Gynecology

## 2020-02-13 ENCOUNTER — Encounter: Payer: Self-pay | Admitting: Obstetrics and Gynecology

## 2020-02-14 NOTE — Progress Notes (Signed)
Patient did not keep her GYN appointment for 02/13/2020.  Cornelia Copa MD Attending [facility] for [employer] [facility])

## 2020-02-20 ENCOUNTER — Encounter: Payer: Self-pay | Admitting: Radiology

## 2020-06-30 ENCOUNTER — Other Ambulatory Visit: Payer: Self-pay | Admitting: Family Medicine

## 2020-06-30 DIAGNOSIS — Z30011 Encounter for initial prescription of contraceptive pills: Secondary | ICD-10-CM

## 2020-07-23 ENCOUNTER — Ambulatory Visit (INDEPENDENT_AMBULATORY_CARE_PROVIDER_SITE_OTHER): Payer: Managed Care, Other (non HMO) | Admitting: Women's Health

## 2020-07-23 ENCOUNTER — Encounter: Payer: Self-pay | Admitting: Women's Health

## 2020-07-23 ENCOUNTER — Other Ambulatory Visit: Payer: Self-pay

## 2020-07-23 VITALS — BP 132/57 | HR 97 | Ht 67.0 in | Wt 203.8 lb

## 2020-07-23 DIAGNOSIS — O219 Vomiting of pregnancy, unspecified: Secondary | ICD-10-CM | POA: Diagnosis not present

## 2020-07-23 DIAGNOSIS — N926 Irregular menstruation, unspecified: Secondary | ICD-10-CM | POA: Diagnosis not present

## 2020-07-23 DIAGNOSIS — Z3491 Encounter for supervision of normal pregnancy, unspecified, first trimester: Secondary | ICD-10-CM

## 2020-07-23 DIAGNOSIS — Z3A01 Less than 8 weeks gestation of pregnancy: Secondary | ICD-10-CM

## 2020-07-23 LAB — POCT URINE PREGNANCY: Preg Test, Ur: POSITIVE — AB

## 2020-07-23 MED ORDER — BONJESTA 20-20 MG PO TBCR: 1.0000 | EXTENDED_RELEASE_TABLET | Freq: Every day | ORAL | 8 refills | Status: DC

## 2020-07-23 NOTE — Progress Notes (Signed)
° °  GYN VISIT Patient name: Julia Levine MRN [id number]  Date of birth: [birth year] Chief Complaint:   New Patient (Initial Visit) and Possible Pregnancy  History of Present Illness:   Julia Levine is a 34 y.o. [phone] Caucasian female at [redacted]w[redacted]d by uncertain LMP of 10/27, being seen today for +HPT.  Some n/v, requests meds. PNV gummies made her sick, plans to pick up some different ones. H/O 24wk PTB of twins w/ both subsequently passing away, then had premature cervical dilation w/ last pregnancy in 2015, did vaginal prometrium, delivered at term.     Depression screen [facility] 2/9 07/23/2020  Decreased Interest 0  Down, Depressed, Hopeless 0  PHQ - 2 Score 0  Altered sleeping 0  Tired, decreased energy 1  Change in appetite 1  Feeling bad or failure about yourself  0  Trouble concentrating 0  Moving slowly or fidgety/restless 0  Suicidal thoughts 0  PHQ-9 Score 2    Patient's last menstrual period was 05/29/2020 (approximate). The current method of family planning is none.  Review of Systems:   Pertinent items are noted in HPI Denies fever/chills, dizziness, headaches, visual disturbances, fatigue, shortness of breath, chest pain, abdominal pain, vomiting, abnormal vaginal discharge/itching/odor/irritation, problems with periods, bowel movements, urination, or intercourse unless otherwise stated above.  Pertinent History Reviewed:  Reviewed past medical,surgical, social, obstetrical and family history.  Reviewed problem list, medications and allergies. Physical Assessment:   Vitals:   07/23/20 1040  BP: (!) 132/57  Pulse: 97  Weight: 203 lb 12.8 oz (92.4 kg)  Height: 5\' 7"  (1.702 m)  Body mass index is 31.92 kg/m.       Physical Examination:   General appearance: alert, well appearing, and in no distress  Mental status: alert, oriented to person, place, and time  Skin: warm & dry   Cardiovascular: normal heart rate noted  Respiratory: normal respiratory  effort, no distress  Abdomen: soft, non-tender   Pelvic: examination not indicated  Extremities: no edema   Chaperone: N/A    Results for orders placed or performed in visit on 07/23/20 (from the past 24 hour(s))  POCT urine pregnancy   Collection Time: 07/23/20 10:44 AM  Result Value Ref Range   Preg Test, Ur Positive (A) Negative    Assessment & Plan:  1) [redacted]w[redacted]d pregnant by uncertain LMP> will get dating u/s, get otc pnv  2) Nausea/vomiting> rx Bonjesta  3) H/O PTB> will review records and discuss further at new ob appt  Meds:  Meds ordered this encounter  Medications   Doxylamine-Pyridoxine ER (BONJESTA) 20-20 MG TBCR    Sig: Take 1 tablet by mouth at bedtime. Can add 1 tablet in the morning if needed for nausea and vomiting    Dispense:  60 tablet    Refill:  8    Order Specific Question:   Supervising Provider    Answer:   [redacted]w[redacted]d H [[id number]]    Orders Placed This Encounter  Procedures   [facility] OB Comp Less 14 Wks   POCT urine pregnancy    Return for 1st available, dating u/s.  [place] CNM, [facility] 07/23/2020 11:23 AM

## 2020-07-23 NOTE — Patient Instructions (Signed)
Julia Levine, I greatly value your feedback.  If you receive a survey following your visit with [place] today, we appreciate you taking the time to fill it out.  Thanks, Joellyn Haff, CNM, WHNP-BC   [facility] ([address] [place], [place] [address]) Entrance C, located off of [address] Free 24/7 valet parking   Nausea & Vomiting  Have saltine crackers or pretzels by your bed and eat a few bites before you raise your head out of bed in the morning  Eat small frequent meals throughout the day instead of large meals  Drink plenty of fluids throughout the day to stay hydrated, just don't drink a lot of fluids with your meals.  This can make your stomach fill up faster making you feel sick  Do not brush your teeth right after you eat  Products with real ginger are good for nausea, like ginger ale and ginger hard candy Make sure it says made with real ginger!  Sucking on sour candy like lemon heads is also good for nausea  If your prenatal vitamins make you nauseated, take them at night so you will sleep through the nausea  Sea Bands  If you feel like you need medicine for the nausea & vomiting please let us know  If you are unable to keep any fluids or food down please let us know   Constipation  Drink plenty of fluid, preferably water, throughout the day  Eat foods high in fiber such as fruits, vegetables, and grains  Exercise, such as walking, is a good way to keep your bowels regular  Drink warm fluids, especially warm prune juice, or decaf coffee  Eat a 1/2 cup of real oatmeal (not instant), 1/2 cup applesauce, and 1/2-1 cup warm prune juice every day  If needed, you may take Colace (docusate sodium) stool softener once or twice a day to help keep the stool soft.   If you still are having problems with constipation, you may take Miralax once daily as needed to help keep your bowels regular.   Home Blood Pressure Monitoring for  Patients   Your provider has recommended that you check your blood pressure (BP) at least once a week at home. If you do not have a blood pressure cuff at home, one will be provided for you. Contact your provider if you have not received your monitor within 1 week.   Helpful Tips for Accurate Home Blood Pressure Checks  . Don't smoke, exercise, or drink caffeine 30 minutes before checking your BP . Use the restroom before checking your BP (a full bladder can raise your pressure) . Relax in a comfortable upright chair . Feet on the ground . Left arm resting comfortably on a flat surface at the level of your heart . Legs uncrossed . Back supported . Sit quietly and don't talk . Place the cuff on your bare arm . Adjust snuggly, so that only two fingertips can fit between your skin and the top of the cuff . Check 2 readings separated by at least one minute . Keep a log of your BP readings . For a visual, please reference this diagram: http://ccnc.care/bpdiagram  Provider Name: [facility]     Phone: [phone]  Zone 1: ALL CLEAR  Continue to monitor your symptoms:  . BP reading is less than 140 (top number) or less than 90 (bottom number)  . No right upper stomach pain . No headaches or  seeing spots . No feeling nauseated or throwing up . No swelling in face and hands  Zone 2: CAUTION Call your doctor's office for any of the following:  . BP reading is greater than 140 (top number) or greater than 90 (bottom number)  . Stomach pain under your ribs in the middle or right side . Headaches or seeing spots . Feeling nauseated or throwing up . Swelling in face and hands  Zone 3: EMERGENCY  Seek immediate medical care if you have any of the following:  . BP reading is greater than160 (top number) or greater than 110 (bottom number) . Severe headaches not improving with Tylenol . Serious difficulty catching your breath . Any worsening symptoms from Zone 2    First Trimester  of Pregnancy The first trimester of pregnancy is from week 1 until the end of week 12 (months 1 through 3). A week after a sperm fertilizes an egg, the egg will implant on the wall of the uterus. This embryo will begin to develop into a baby. Genes from you and your partner are forming the baby. The female genes determine whether the baby is a boy or a girl. At 6-8 weeks, the eyes and face are formed, and the heartbeat can be seen on ultrasound. At the end of 12 weeks, all the baby's organs are formed.  Now that you are pregnant, you will want to do everything you can to have a healthy baby. Two of the most important things are to get good prenatal care and to follow your health care provider's instructions. Prenatal care is all the medical care you receive before the baby's birth. This care will help prevent, find, and treat any problems during the pregnancy and childbirth. BODY CHANGES Your body goes through many changes during pregnancy. The changes vary from woman to woman.   You may gain or lose a couple of pounds at first.  You may feel sick to your stomach (nauseous) and throw up (vomit). If the vomiting is uncontrollable, call your health care provider.  You may tire easily.  You may develop headaches that can be relieved by medicines approved by your health care provider.  You may urinate more often. Painful urination may mean you have a bladder infection.  You may develop heartburn as a result of your pregnancy.  You may develop constipation because certain hormones are causing the muscles that push waste through your intestines to slow down.  You may develop hemorrhoids or swollen, bulging veins (varicose veins).  Your breasts may begin to grow larger and become tender. Your nipples may stick out more, and the tissue that surrounds them (areola) may become darker.  Your gums may bleed and may be sensitive to brushing and flossing.  Dark spots or blotches (chloasma, mask of  pregnancy) may develop on your face. This will likely fade after the baby is born.  Your menstrual periods will stop.  You may have a loss of appetite.  You may develop cravings for certain kinds of food.  You may have changes in your emotions from day to day, such as being excited to be pregnant or being concerned that something may go wrong with the pregnancy and baby.  You may have more vivid and strange dreams.  You may have changes in your hair. These can include thickening of your hair, rapid growth, and changes in texture. Some women also have hair loss during or after pregnancy, or hair that feels dry or thin. Your  hair will most likely return to normal after your baby is born. WHAT TO EXPECT AT YOUR PRENATAL VISITS During a routine prenatal visit:  You will be weighed to make sure you and the baby are growing normally.  Your blood pressure will be taken.  Your abdomen will be measured to track your baby's growth.  The fetal heartbeat will be listened to starting around week 10 or 12 of your pregnancy.  Test results from any previous visits will be discussed. Your health care provider may ask you:  How you are feeling.  If you are feeling the baby move.  If you have had any abnormal symptoms, such as leaking fluid, bleeding, severe headaches, or abdominal cramping.  If you have any questions. Other tests that may be performed during your first trimester include:  Blood tests to find your blood type and to check for the presence of any previous infections. They will also be used to check for low iron levels (anemia) and Rh antibodies. Later in the pregnancy, blood tests for diabetes will be done along with other tests if problems develop.  Urine tests to check for infections, diabetes, or protein in the urine.  An ultrasound to confirm the proper growth and development of the baby.  An amniocentesis to check for possible genetic problems.  Fetal screens for spina  bifida and Down syndrome.  You may need other tests to make sure you and the baby are doing well. HOME CARE INSTRUCTIONS  Medicines  Follow your health care provider's instructions regarding medicine use. Specific medicines may be either safe or unsafe to take during pregnancy.  Take your prenatal vitamins as directed.  If you develop constipation, try taking a stool softener if your health care provider approves. Diet  Eat regular, well-balanced meals. Choose a variety of foods, such as meat or vegetable-based protein, fish, milk and low-fat dairy products, vegetables, fruits, and whole grain breads and cereals. Your health care provider will help you determine the amount of weight gain that is right for you.  Avoid raw meat and uncooked cheese. These carry germs that can cause birth defects in the baby.  Eating four or five small meals rather than three large meals a day may help relieve nausea and vomiting. If you start to feel nauseous, eating a few soda crackers can be helpful. Drinking liquids between meals instead of during meals also seems to help nausea and vomiting.  If you develop constipation, eat more high-fiber foods, such as fresh vegetables or fruit and whole grains. Drink enough fluids to keep your urine clear or pale yellow. Activity and Exercise  Exercise only as directed by your health care provider. Exercising will help you:  Control your weight.  Stay in shape.  Be prepared for labor and delivery.  Experiencing pain or cramping in the lower abdomen or low back is a good sign that you should stop exercising. Check with your health care provider before continuing normal exercises.  Try to avoid standing for long periods of time. Move your legs often if you must stand in one place for a long time.  Avoid heavy lifting.  Wear low-heeled shoes, and practice good posture.  You may continue to have sex unless your health care provider directs you  otherwise. Relief of Pain or Discomfort  Wear a good support bra for breast tenderness.    Take warm sitz baths to soothe any pain or discomfort caused by hemorrhoids. Use hemorrhoid cream if your health care provider  approves.    Rest with your legs elevated if you have leg cramps or low back pain.  If you develop varicose veins in your legs, wear support hose. Elevate your feet for 15 minutes, 3-4 times a day. Limit salt in your diet. Prenatal Care  Schedule your prenatal visits by the twelfth week of pregnancy. They are usually scheduled monthly at first, then more often in the last 2 months before delivery.  Write down your questions. Take them to your prenatal visits.  Keep all your prenatal visits as directed by your health care provider. Safety  Wear your seat belt at all times when driving.  Make a list of emergency phone numbers, including numbers for family, friends, the hospital, and police and fire departments. General Tips  Ask your health care provider for a referral to a local prenatal education class. Begin classes no later than at the beginning of month 6 of your pregnancy.  Ask for help if you have counseling or nutritional needs during pregnancy. Your health care provider can offer advice or refer you to specialists for help with various needs.  Do not use hot tubs, steam rooms, or saunas.  Do not douche or use tampons or scented sanitary pads.  Do not cross your legs for long periods of time.  Avoid cat litter boxes and soil used by cats. These carry germs that can cause birth defects in the baby and possibly loss of the fetus by miscarriage or stillbirth.  Avoid all smoking, herbs, alcohol, and medicines not prescribed by your health care provider. Chemicals in these affect the formation and growth of the baby.  Schedule a dentist appointment. At home, brush your teeth with a soft toothbrush and be gentle when you floss. SEEK MEDICAL CARE IF:   You have  dizziness.  You have mild pelvic cramps, pelvic pressure, or nagging pain in the abdominal area.  You have persistent nausea, vomiting, or diarrhea.  You have a bad smelling vaginal discharge.  You have pain with urination.  You notice increased swelling in your face, hands, legs, or ankles. SEEK IMMEDIATE MEDICAL CARE IF:   You have a fever.  You are leaking fluid from your vagina.  You have spotting or bleeding from your vagina.  You have severe abdominal cramping or pain.  You have rapid weight gain or loss.  You vomit blood or material that looks like coffee grounds.  You are exposed to [place] measles and have never had them.  You are exposed to fifth disease or chickenpox.  You develop a severe headache.  You have shortness of breath.  You have any kind of trauma, such as from a fall or a car accident. Document Released: 07/14/2001 Document Revised: 12/04/2013 Document Reviewed: 05/30/2013 [facility] Patient Information 2015 [place], [place]. This information is not intended to replace advice given to you by your health care provider. Make sure you discuss any questions you have with your health care provider.

## 2020-08-03 NOTE — L&D Delivery Note (Signed)
OB/GYN Faculty Practice Delivery Note  Julia Levine is a 35 y.o. [id number] at [redacted]w[redacted]d admitted for SROM @ 0300.   GBS Status: Negative/-- (07/07 1200) Maximum Maternal Temperature: 98.3  Labor course: Initial SVE: 8/100/2/vtx. Augmentation with: N/A. She then progressed to complete.  ROM: 2h 14m with clear fluid  Birth: At 0508 a viable female was delivered via spontaneous vaginal delivery (Presentation: OA with compound RT hand). Nuchal cord present: No.  Shoulders and body delivered in usual fashion. Infant placed directly on mom's abdomen for bonding/skin-to-skin, baby dried and stimulated. Cord clamped x 2 after 1 minute and cut by FOB.  Cord blood collected.  The placenta separated spontaneously and delivered via gentle cord traction.  Pitocin infused rapidly IV per protocol.  Fundus firm with massage.  Placenta inspected and appears to be intact with a 3 VC.  Placenta/Cord with the following complications: none .  Cord pH: n/a Sponge and instrument count were correct x2.  Intrapartum complications:  None Anesthesia:  none Episiotomy: none Lacerations:  none Suture Repair:  n/a EBL (mL): 100   Infant: APGAR (1 MIN): 8   APGAR (5 MINS): 9   Infant weight: pending  Mom to postpartum.  Baby to Couplet care / Skin to Skin. Placenta to L&D   Plans to Bottlefeed Contraception: tubal ligation Circumcision: wants inpatient  Note sent to [facility]: FT for pp visit.  Raelyn Mora, MSN, CNM 02/23/2021 5:26 AM

## 2020-08-12 ENCOUNTER — Ambulatory Visit (INDEPENDENT_AMBULATORY_CARE_PROVIDER_SITE_OTHER): Payer: Managed Care, Other (non HMO)

## 2020-08-12 ENCOUNTER — Other Ambulatory Visit: Payer: Self-pay

## 2020-08-12 DIAGNOSIS — Z3A10 10 weeks gestation of pregnancy: Secondary | ICD-10-CM

## 2020-08-12 DIAGNOSIS — Z3491 Encounter for supervision of normal pregnancy, unspecified, first trimester: Secondary | ICD-10-CM | POA: Diagnosis not present

## 2020-08-12 NOTE — Progress Notes (Signed)
[place] 10+5 wks,single IUP,crl 34.10 mm,normal ovaries,fhr 160 BPM

## 2020-08-13 ENCOUNTER — Encounter: Payer: Managed Care, Other (non HMO) | Admitting: Obstetrics & Gynecology

## 2020-08-22 ENCOUNTER — Other Ambulatory Visit: Payer: Managed Care, Other (non HMO)

## 2020-08-22 DIAGNOSIS — Z20822 Contact with and (suspected) exposure to covid-19: Secondary | ICD-10-CM

## 2020-08-23 LAB — SARS-COV-2, NAA 2 DAY TAT

## 2020-08-23 LAB — NOVEL CORONAVIRUS, NAA: SARS-CoV-2, NAA: DETECTED — AB

## 2020-08-28 ENCOUNTER — Encounter: Payer: Managed Care, Other (non HMO) | Admitting: Advanced Practice Midwife

## 2020-08-28 ENCOUNTER — Ambulatory Visit: Payer: Managed Care, Other (non HMO) | Admitting: *Deleted

## 2020-08-28 ENCOUNTER — Other Ambulatory Visit: Payer: Managed Care, Other (non HMO)

## 2020-09-03 DIAGNOSIS — Z3490 Encounter for supervision of normal pregnancy, unspecified, unspecified trimester: Secondary | ICD-10-CM | POA: Insufficient documentation

## 2020-09-04 ENCOUNTER — Ambulatory Visit (INDEPENDENT_AMBULATORY_CARE_PROVIDER_SITE_OTHER): Payer: Managed Care, Other (non HMO) | Admitting: Women's Health

## 2020-09-04 ENCOUNTER — Other Ambulatory Visit (INDEPENDENT_AMBULATORY_CARE_PROVIDER_SITE_OTHER): Payer: Managed Care, Other (non HMO)

## 2020-09-04 ENCOUNTER — Ambulatory Visit: Payer: Managed Care, Other (non HMO) | Admitting: *Deleted

## 2020-09-04 ENCOUNTER — Other Ambulatory Visit: Payer: Self-pay | Admitting: Obstetrics & Gynecology

## 2020-09-04 ENCOUNTER — Encounter: Payer: Self-pay | Admitting: Women's Health

## 2020-09-04 ENCOUNTER — Other Ambulatory Visit: Payer: Self-pay

## 2020-09-04 VITALS — BP 121/56 | HR 88 | Wt 199.0 lb

## 2020-09-04 DIAGNOSIS — Z3682 Encounter for antenatal screening for nuchal translucency: Secondary | ICD-10-CM

## 2020-09-04 DIAGNOSIS — Z3A14 14 weeks gestation of pregnancy: Secondary | ICD-10-CM | POA: Diagnosis not present

## 2020-09-04 DIAGNOSIS — Z3482 Encounter for supervision of other normal pregnancy, second trimester: Secondary | ICD-10-CM

## 2020-09-04 DIAGNOSIS — Z3480 Encounter for supervision of other normal pregnancy, unspecified trimester: Secondary | ICD-10-CM

## 2020-09-04 DIAGNOSIS — Z8751 Personal history of pre-term labor: Secondary | ICD-10-CM

## 2020-09-04 LAB — POCT URINALYSIS DIPSTICK OB
Blood, UA: NEGATIVE
Glucose, UA: NEGATIVE
Ketones, UA: NEGATIVE
Leukocytes, UA: NEGATIVE
Nitrite, UA: NEGATIVE

## 2020-09-04 MED ORDER — DOXYLAMINE-PYRIDOXINE 10-10 MG PO TBEC: DELAYED_RELEASE_TABLET | ORAL | 6 refills | Status: DC

## 2020-09-04 NOTE — Progress Notes (Signed)
[place] 14 wks,measurements c/w dates,fhr 147 bpm,posterior placenta,normal ovaries,NB present,NT 1.6 mm,CRL 73.49 mm

## 2020-09-04 NOTE — Progress Notes (Signed)
INITIAL OBSTETRICAL VISIT Patient name: Julia Levine MRN [id number]  Date of birth: 1986-01-02 Chief Complaint:   Initial Prenatal Visit (nausea)  History of Present Illness:   Julia Levine is a 35 y.o. [phone] Caucasian female at [redacted]w[redacted]d by LMP c/w u/s at 10 weeks with an Estimated Date of Delivery: 03/05/21 being seen today for her initial obstetrical visit.   Her obstetrical history is significant for term uncomplicated SVB x 1, then SAB, then 24wk PPROM>SVB twins w/ neonatal demise of both twins, then term uncomplicated SVB x 1.   Today she reports nausea, occ vomiting, wants meds.  Depression screen [facility] 2/9 09/04/2020 07/23/2020  Decreased Interest 0 0  Down, Depressed, Hopeless 0 0  PHQ - 2 Score 0 0  Altered sleeping 0 0  Tired, decreased energy 1 1  Change in appetite 1 1  Feeling bad or failure about yourself  0 0  Trouble concentrating 0 0  Moving slowly or fidgety/restless 0 0  Suicidal thoughts 0 0  PHQ-9 Score 2 2    Patient's last menstrual period was 05/29/2020 (approximate). Last pap 04/24/19. Results were: NILM w/ HRHPV negative Review of Systems:   Pertinent items are noted in HPI Denies cramping/contractions, leakage of fluid, vaginal bleeding, abnormal vaginal discharge w/ itching/odor/irritation, headaches, visual changes, shortness of breath, chest pain, abdominal pain, severe nausea/vomiting, or problems with urination or bowel movements unless otherwise stated above.  Pertinent History Reviewed:  Reviewed past medical,surgical, social, obstetrical and family history.  Reviewed problem list, medications and allergies. OB History  Gravida Para Term Preterm AB Living  5 3 2 1 1 2   SAB IAB Ectopic Multiple Live Births  1 0 0 1 4    # Outcome Date GA Lbr Len/2nd Weight Sex Delivery Anes PTL Lv  5 Current           4 Term 01/03/14 [redacted]w[redacted]d -17:00 / 00:41 8 lb 8.4 oz (3.867 kg) M Vag-Spont EPI  LIV  3A Preterm 05/11/12 [redacted]w[redacted]d 63:10 / 35:32 1 lb  9.4 oz (0.72 kg) M Vag-Spont EPI  DEC     Birth Comments: Neonatal demise  3B Preterm 05/12/12 [redacted]w[redacted]d 63:10 / 63:35 1 lb 5.5 oz (0.61 kg) M Vag-Breech EPI  DEC     Birth Comments: Neonatal demise  2 SAB 03/2011 [redacted]w[redacted]d   U    DEC  1 Term 12/28/08 [redacted]w[redacted]d  6 lb 6 oz (2.892 kg) F Vag-Spont EPI  LIV     Birth Comments: System Generated. Please review and update pregnancy details.   Physical Assessment:   Vitals:   09/04/20 1025  BP: (!) 121/56  Pulse: 88  Weight: 199 lb (90.3 kg)  Body mass index is 31.17 kg/m.       Physical Examination:  General appearance - well appearing, and in no distress  Mental status - alert, oriented to person, place, and time  Psych:  She has a normal mood and affect  Skin - warm and dry, normal color, no suspicious lesions noted  Chest - effort normal, all lung fields clear to auscultation bilaterally  Heart - normal rate and regular rhythm  Abdomen - soft, nontender  Extremities:  No swelling or varicosities noted  Thin prep pap is not done   Chaperone: N/A    TODAY'S NT 11/02/20 14 wks,measurements c/w dates,fhr 147 bpm,posterior placenta,normal ovaries,NB present,NT 1.6 mm,CRL 73.49 mm   Results for orders placed or performed in visit on 09/04/20 (from the past 24 hour(s))  POC Urinalysis Dipstick OB   Collection Time: 09/04/20 10:46 AM  Result Value Ref Range   Color, UA     Clarity, UA     Glucose, UA Negative Negative   Bilirubin, UA     Ketones, UA neg    Spec Grav, UA     Blood, UA neg    pH, UA     POC,PROTEIN,UA Trace Negative, Trace, Small (1+), Moderate (2+), Large (3+), 4+   Urobilinogen, UA     Nitrite, UA neg    Leukocytes, UA Negative Negative   Appearance     Odor      Assessment & Plan:  1) Low-Risk Pregnancy [id number] at [redacted]w[redacted]d with an Estimated Date of Delivery: 03/05/21   2) Initial OB visit  3) Nausea> rx diclegis  4) H/O 24wk PPROM w/ twins> w/ neonatal demise of both. States last pregnancy she was put on prometrium.  Reviewed chart, 17P offered at beginning of pregnancy- insurance wouldn't cover as PTB was multigestation. She was offered prometrium, which she did. Had normal serial length u/s. Discussed we don't typically do progesterone supplementation for h/o multigestation PTB, will discuss w/ LHE.   Meds:  Meds ordered this encounter  Medications  . Doxylamine-Pyridoxine (DICLEGIS) 10-10 MG TBEC    Sig: 2 tabs q hs, if sx persist add 1 tab q am on day 3, if sx persist add 1 tab q afternoon on day 4    Dispense:  100 tablet    Refill:  6    Order Specific Question:   Supervising Provider    Answer:   Duane Lope H [[id number]]    Initial labs obtained Continue prenatal vitamins Reviewed n/v relief measures and warning s/s to report Reviewed recommended weight gain based on pre-gravid BMI Encouraged well-balanced diet Genetic & carrier screening discussed: requests Panorama and NT/IT, declines Horizon 14  Ultrasound discussed; fetal survey: requested CCNC completed> form faxed if has or is planning to apply for medicaid The nature of [facility] [employer] with multiple MDs and other Advanced Practice Providers was explained to patient; also emphasized that fellows, residents, and students are part of our team. Has home bp cuff. Check bp weekly, let us know if >140/90.   Follow-up: Return for 12 today for nt u/s; then 4wks LROB w/ anatomy u/s and 2nd IT in person w/ cnm.   Orders Placed This Encounter  Procedures  . Urine Culture  . GC/Chlamydia Probe Amp  . Pain Management Screening Profile (10S)  . CBC/D/Plt+RPR+Rh+ABO+Rub Ab...  . Hgb Fractionation Cascade  . Genetic Screening  . Integrated 1  . POC Urinalysis Dipstick OB    Cheral Marker CNM, [facility] 09/04/2020 1:28 PM

## 2020-09-04 NOTE — Patient Instructions (Signed)
Julia Levine, I greatly value your feedback.  If you receive a survey following your visit with [place] today, we appreciate you taking the time to fill it out.  Thanks, Joellyn Haff, CNM, WHNP-BC  [facility] ([address] [place], [place] [address]) Entrance C, located off of [address] [employer] [address] valet parking  Go to [email or web address] to register for FREE online childbirth classes  [place] Pediatricians/Family Doctors:  [facility] [phone]            [facility] [phone]                 [facility] [phone] (usually not accepting new patients unless you have family there already, you are always welcome to call and ask)       [facility] Department [phone]       [facility] Pediatricians/Family Doctors:   [facility]: [phone]  [facility]: [phone]  [facility]: [phone]  [facility] Doctors:   [facility]: [phone]   [facility]: [phone]  [facility] Doctors:  [facility]: [phone]    Home Blood Pressure Monitoring for Patients   Your provider has recommended that you check your blood pressure (BP) at least once a week at home. If you do not have a blood pressure cuff at home, one will be provided for you. Contact your provider if you have not received your monitor within 1 week.   Helpful Tips for Accurate Home Blood Pressure Checks  . Don't smoke, exercise, or drink caffeine 30 minutes before checking your BP . Use the restroom before checking your BP (a full bladder can raise your pressure) . Relax in a comfortable upright chair . Feet on the ground . Left arm resting comfortably on a flat surface at the level of your heart . Legs uncrossed . Back supported . Sit quietly and don't talk . Place the cuff on your bare arm . Adjust  snuggly, so that only two fingertips can fit between your skin and the top of the cuff . Check 2 readings separated by at least one minute . Keep a log of your BP readings . For a visual, please reference this diagram: http://ccnc.care/bpdiagram  Provider Name: [facility]     Phone: [phone]  Zone 1: ALL CLEAR  Continue to monitor your symptoms:  . BP reading is less than 140 (top number) or less than 90 (bottom number)  . No right upper stomach pain . No headaches or seeing spots . No feeling nauseated or throwing up . No swelling in face and hands  Zone 2: CAUTION Call your doctor's office for any of the following:  . BP reading is greater than 140 (top number) or greater than 90 (bottom number)  . Stomach pain under your ribs in the middle or right side . Headaches or seeing spots . Feeling nauseated or throwing up . Swelling in face and hands  Zone 3: EMERGENCY  Seek immediate medical care if you have any of the following:  . BP reading is greater than160 (top number) or greater than 110 (bottom number) . Severe headaches not improving with Tylenol . Serious difficulty catching your breath . Any worsening symptoms from Zone 2     Second Trimester of Pregnancy The second trimester is from week 14 through week 27 (months 4 through 6). The second trimester is often a time when you feel your  best. Your body has adjusted to being pregnant, and you begin to feel better physically. Usually, morning sickness has lessened or quit completely, you may have more energy, and you may have an increase in appetite. The second trimester is also a time when the fetus is growing rapidly. At the end of the sixth month, the fetus is about 9 inches long and weighs about 1 pounds. You will likely begin to feel the baby move (quickening) between 16 and 20 weeks of pregnancy. Body changes during your second trimester Your body continues to go through many changes during your second  trimester. The changes vary from woman to woman.  Your weight will continue to increase. You will notice your lower abdomen bulging out.  You may begin to get stretch marks on your hips, abdomen, and breasts.  You may develop headaches that can be relieved by medicines. The medicines should be approved by your health care provider.  You may urinate more often because the fetus is pressing on your bladder.  You may develop or continue to have heartburn as a result of your pregnancy.  You may develop constipation because certain hormones are causing the muscles that push waste through your intestines to slow down.  You may develop hemorrhoids or swollen, bulging veins (varicose veins).  You may have back pain. This is caused by: ? Weight gain. ? Pregnancy hormones that are relaxing the joints in your pelvis. ? A shift in weight and the muscles that support your balance.  Your breasts will continue to grow and they will continue to become tender.  Your gums may bleed and may be sensitive to brushing and flossing.  Dark spots or blotches (chloasma, mask of pregnancy) may develop on your face. This will likely fade after the baby is born.  A dark line from your belly button to the pubic area (linea nigra) may appear. This will likely fade after the baby is born.  You may have changes in your hair. These can include thickening of your hair, rapid growth, and changes in texture. Some women also have hair loss during or after pregnancy, or hair that feels dry or thin. Your hair will most likely return to normal after your baby is born.  What to expect at prenatal visits During a routine prenatal visit:  You will be weighed to make sure you and the fetus are growing normally.  Your blood pressure will be taken.  Your abdomen will be measured to track your baby's growth.  The fetal heartbeat will be listened to.  Any test results from the previous visit will be discussed.  Your  health care provider may ask you:  How you are feeling.  If you are feeling the baby move.  If you have had any abnormal symptoms, such as leaking fluid, bleeding, severe headaches, or abdominal cramping.  If you are using any tobacco products, including cigarettes, chewing tobacco, and electronic cigarettes.  If you have any questions.  Other tests that may be performed during your second trimester include:  Blood tests that check for: ? Low iron levels (anemia). ? High blood sugar that affects pregnant women (gestational diabetes) between 64 and 28 weeks. ? Rh antibodies. This is to check for a protein on red blood cells (Rh factor).  Urine tests to check for infections, diabetes, or protein in the urine.  An ultrasound to confirm the proper growth and development of the baby.  An amniocentesis to check for possible genetic problems.  Fetal  screens for spina bifida and Down syndrome.  HIV (human immunodeficiency virus) testing. Routine prenatal testing includes screening for HIV, unless you choose not to have this test.  Follow these instructions at home: Medicines  Follow your health care provider's instructions regarding medicine use. Specific medicines may be either safe or unsafe to take during pregnancy.  Take a prenatal vitamin that contains at least 600 micrograms (mcg) of folic acid.  If you develop constipation, try taking a stool softener if your health care provider approves. Eating and drinking  Eat a balanced diet that includes fresh fruits and vegetables, whole grains, good sources of protein such as meat, eggs, or tofu, and low-fat dairy. Your health care provider will help you determine the amount of weight gain that is right for you.  Avoid raw meat and uncooked cheese. These carry germs that can cause birth defects in the baby.  If you have low calcium intake from food, talk to your health care provider about whether you should take a daily calcium  supplement.  Limit foods that are high in fat and processed sugars, such as fried and sweet foods.  To prevent constipation: ? Drink enough fluid to keep your urine clear or pale yellow. ? Eat foods that are high in fiber, such as fresh fruits and vegetables, whole grains, and beans. Activity  Exercise only as directed by your health care provider. Most women can continue their usual exercise routine during pregnancy. Try to exercise for 30 minutes at least 5 days a week. Stop exercising if you experience uterine contractions.  Avoid heavy lifting, wear low heel shoes, and practice good posture.  A sexual relationship may be continued unless your health care provider directs you otherwise. Relieving pain and discomfort  Wear a good support bra to prevent discomfort from breast tenderness.  Take warm sitz baths to soothe any pain or discomfort caused by hemorrhoids. Use hemorrhoid cream if your health care provider approves.  Rest with your legs elevated if you have leg cramps or low back pain.  If you develop varicose veins, wear support hose. Elevate your feet for 15 minutes, 3-4 times a day. Limit salt in your diet. Prenatal Care  Write down your questions. Take them to your prenatal visits.  Keep all your prenatal visits as told by your health care provider. This is important. Safety  Wear your seat belt at all times when driving.  Make a list of emergency phone numbers, including numbers for family, friends, the hospital, and police and fire departments. General instructions  Ask your health care provider for a referral to a local prenatal education class. Begin classes no later than the beginning of month 6 of your pregnancy.  Ask for help if you have counseling or nutritional needs during pregnancy. Your health care provider can offer advice or refer you to specialists for help with various needs.  Do not use hot tubs, steam rooms, or saunas.  Do not douche or use  tampons or scented sanitary pads.  Do not cross your legs for long periods of time.  Avoid cat litter boxes and soil used by cats. These carry germs that can cause birth defects in the baby and possibly loss of the fetus by miscarriage or stillbirth.  Avoid all smoking, herbs, alcohol, and unprescribed drugs. Chemicals in these products can affect the formation and growth of the baby.  Do not use any products that contain nicotine or tobacco, such as cigarettes and e-cigarettes. If you need help  quitting, ask your health care provider.  Visit your dentist if you have not gone yet during your pregnancy. Use a soft toothbrush to brush your teeth and be gentle when you floss. Contact a health care provider if:  You have dizziness.  You have mild pelvic cramps, pelvic pressure, or nagging pain in the abdominal area.  You have persistent nausea, vomiting, or diarrhea.  You have a bad smelling vaginal discharge.  You have pain when you urinate. Get help right away if:  You have a fever.  You are leaking fluid from your vagina.  You have spotting or bleeding from your vagina.  You have severe abdominal cramping or pain.  You have rapid weight gain or weight loss.  You have shortness of breath with chest pain.  You notice sudden or extreme swelling of your face, hands, ankles, feet, or legs.  You have not felt your baby move in over an hour.  You have severe headaches that do not go away when you take medicine.  You have vision changes. Summary  The second trimester is from week 14 through week 27 (months 4 through 6). It is also a time when the fetus is growing rapidly.  Your body goes through many changes during pregnancy. The changes vary from woman to woman.  Avoid all smoking, herbs, alcohol, and unprescribed drugs. These chemicals affect the formation and growth your baby.  Do not use any tobacco products, such as cigarettes, chewing tobacco, and e-cigarettes. If you  need help quitting, ask your health care provider.  Contact your health care provider if you have any questions. Keep all prenatal visits as told by your health care provider. This is important. This information is not intended to replace advice given to you by your health care provider. Make sure you discuss any questions you have with your health care provider. Document Released: 07/14/2001 Document Revised: 12/26/2015 Document Reviewed: 09/20/2012 Elsevier Interactive Patient Education  2017 [employer].   

## 2020-09-05 ENCOUNTER — Other Ambulatory Visit: Payer: Self-pay | Admitting: Women's Health

## 2020-09-05 DIAGNOSIS — Z8751 Personal history of pre-term labor: Secondary | ICD-10-CM

## 2020-09-05 LAB — PMP SCREEN PROFILE (10S), URINE
Amphetamine Scrn, Ur: NEGATIVE ng/mL
BARBITURATE SCREEN URINE: NEGATIVE ng/mL
BENZODIAZEPINE SCREEN, URINE: NEGATIVE ng/mL
CANNABINOIDS UR QL SCN: NEGATIVE ng/mL
Cocaine (Metab) Scrn, Ur: NEGATIVE ng/mL
Creatinine(Crt), U: 336.3 mg/dL — ABNORMAL HIGH (ref 20.0–300.0)
Methadone Screen, Urine: NEGATIVE ng/mL
OXYCODONE+OXYMORPHONE UR QL SCN: NEGATIVE ng/mL
Opiate Scrn, Ur: NEGATIVE ng/mL
Ph of Urine: 6.3 (ref 4.5–8.9)
Phencyclidine Qn, Ur: NEGATIVE ng/mL
Propoxyphene Scrn, Ur: NEGATIVE ng/mL

## 2020-09-05 MED ORDER — PROGESTERONE 200 MG PO CAPS: 200.0000 mg | ORAL_CAPSULE | Freq: Every day | ORAL | 5 refills | Status: DC

## 2020-09-06 LAB — CBC/D/PLT+RPR+RH+ABO+RUB AB...
Antibody Screen: NEGATIVE
Basophils Absolute: 0 10*3/uL (ref 0.0–0.2)
Basos: 0 %
EOS (ABSOLUTE): 0.1 10*3/uL (ref 0.0–0.4)
Eos: 1 %
HCV Ab: <0.1 s/co ratio (ref 0.0–0.9)
HIV Screen 4th Generation wRfx: NONREACTIVE
Hematocrit: 39.4 % (ref 34.0–46.6)
Hemoglobin: 13.4 g/dL (ref 11.1–15.9)
Hepatitis B Surface Ag: NEGATIVE
Immature Grans (Abs): 0 10*3/uL (ref 0.0–0.1)
Immature Granulocytes: 1 %
Lymphocytes Absolute: 1.8 10*3/uL (ref 0.7–3.1)
Lymphs: 24 %
MCH: 31.8 pg (ref 26.6–33.0)
MCHC: 34 g/dL (ref 31.5–35.7)
MCV: 94 fL (ref 79–97)
Monocytes Absolute: 0.4 10*3/uL (ref 0.1–0.9)
Monocytes: 6 %
Neutrophils Absolute: 5.2 10*3/uL (ref 1.4–7.0)
Neutrophils: 68 %
Platelets: 164 10*3/uL (ref 150–450)
RBC: 4.21 x10E6/uL (ref 3.77–5.28)
RDW: 12.7 % (ref 11.7–15.4)
RPR Ser Ql: NONREACTIVE
Rh Factor: POSITIVE
Rubella Antibodies, IGG: 1.38 index (ref >0.99)
WBC: 7.5 10*3/uL (ref 3.4–10.8)

## 2020-09-06 LAB — GC/CHLAMYDIA PROBE AMP
Chlamydia trachomatis, NAA: NEGATIVE
Neisseria Gonorrhoeae by PCR: NEGATIVE

## 2020-09-06 LAB — URINE CULTURE

## 2020-09-06 LAB — HCV INTERPRETATION

## 2020-09-07 LAB — INTEGRATED 1
Crown Rump Length: 73.5 mm
Gest. Age on Collection Date: 13.4 weeks
Maternal Age at EDD: 34.3 yr
Nuchal Translucency (NT): 1.6 mm
Number of Fetuses: 1
PAPP-A Value: 681.3 ng/mL
Weight: 199 [lb_av]

## 2020-09-10 LAB — HGB FRACTIONATION CASCADE
Hgb A2: 2.7 % (ref 1.8–3.2)
Hgb A: 97.3 % (ref 96.4–98.8)
Hgb F: 0 % (ref 0.0–2.0)
Hgb S: 0 %

## 2020-09-17 ENCOUNTER — Other Ambulatory Visit: Payer: Self-pay | Admitting: Women's Health

## 2020-09-17 DIAGNOSIS — Z0375 Encounter for suspected cervical shortening ruled out: Secondary | ICD-10-CM

## 2020-09-18 ENCOUNTER — Other Ambulatory Visit: Payer: Managed Care, Other (non HMO)

## 2020-09-18 ENCOUNTER — Encounter: Payer: Managed Care, Other (non HMO) | Admitting: Advanced Practice Midwife

## 2020-10-01 ENCOUNTER — Other Ambulatory Visit: Payer: Self-pay | Admitting: Women's Health

## 2020-10-01 DIAGNOSIS — Z0375 Encounter for suspected cervical shortening ruled out: Secondary | ICD-10-CM

## 2020-10-01 DIAGNOSIS — Z363 Encounter for antenatal screening for malformations: Secondary | ICD-10-CM

## 2020-10-02 ENCOUNTER — Other Ambulatory Visit: Payer: Self-pay

## 2020-10-02 ENCOUNTER — Ambulatory Visit (INDEPENDENT_AMBULATORY_CARE_PROVIDER_SITE_OTHER): Payer: Managed Care, Other (non HMO) | Admitting: Advanced Practice Midwife

## 2020-10-02 ENCOUNTER — Ambulatory Visit (INDEPENDENT_AMBULATORY_CARE_PROVIDER_SITE_OTHER): Payer: Managed Care, Other (non HMO)

## 2020-10-02 VITALS — BP 133/51 | HR 80 | Wt 204.0 lb

## 2020-10-02 DIAGNOSIS — Z1379 Encounter for other screening for genetic and chromosomal anomalies: Secondary | ICD-10-CM

## 2020-10-02 DIAGNOSIS — Z363 Encounter for antenatal screening for malformations: Secondary | ICD-10-CM

## 2020-10-02 DIAGNOSIS — Z3482 Encounter for supervision of other normal pregnancy, second trimester: Secondary | ICD-10-CM

## 2020-10-02 DIAGNOSIS — Z3480 Encounter for supervision of other normal pregnancy, unspecified trimester: Secondary | ICD-10-CM

## 2020-10-02 DIAGNOSIS — Z3A18 18 weeks gestation of pregnancy: Secondary | ICD-10-CM

## 2020-10-02 DIAGNOSIS — Z302 Encounter for sterilization: Secondary | ICD-10-CM | POA: Insufficient documentation

## 2020-10-02 DIAGNOSIS — Z8751 Personal history of pre-term labor: Secondary | ICD-10-CM

## 2020-10-02 DIAGNOSIS — Z0375 Encounter for suspected cervical shortening ruled out: Secondary | ICD-10-CM | POA: Diagnosis not present

## 2020-10-02 DIAGNOSIS — O09299 Supervision of pregnancy with other poor reproductive or obstetric history, unspecified trimester: Secondary | ICD-10-CM

## 2020-10-02 DIAGNOSIS — R300 Dysuria: Secondary | ICD-10-CM

## 2020-10-02 LAB — POCT URINALYSIS DIPSTICK OB
Blood, UA: NEGATIVE
Glucose, UA: NEGATIVE
Ketones, UA: NEGATIVE
Leukocytes, UA: NEGATIVE
Nitrite, UA: NEGATIVE
POC,PROTEIN,UA: NEGATIVE

## 2020-10-02 NOTE — Progress Notes (Signed)
LOW-RISK PREGNANCY VISIT Patient name: Julia Levine MRN [id number]  Date of birth: 07-10-1986 Chief Complaint:   Routine Prenatal Visit (2nd IT and Anatomy scan)  History of Present Illness:   Julia Levine is a 35 y.o. [phone] female at [redacted]w[redacted]d with an Estimated Date of Delivery: 03/05/21 being seen today for ongoing management of a low-risk pregnancy.  Today she reports doing well other than slight dysuria. Contractions: Not present. Vag. Bleeding: None.  Movement: Absent. denies leaking of fluid. Review of Systems:   Pertinent items are noted in HPI Denies abnormal vaginal discharge w/ itching/odor/irritation, headaches, visual changes, shortness of breath, chest pain, abdominal pain, severe nausea/vomiting, or problems with urination or bowel movements unless otherwise stated above. Pertinent History Reviewed:  Reviewed past medical,surgical, social, obstetrical and family history.  Reviewed problem list, medications and allergies. Physical Assessment:   Vitals:   10/02/20 1458  BP: (!) 133/51  Pulse: 80  Weight: 204 lb (92.5 kg)  Body mass index is 31.95 kg/m.        Physical Examination:   General appearance: Well appearing, and in no distress  Mental status: Alert, oriented to person, place, and time  Skin: Warm & dry  Cardiovascular: Normal heart rate noted  Respiratory: Normal respiratory effort, no distress  Abdomen: Soft, gravid, nontender  Pelvic: Cervical exam deferred         Extremities: Edema: None  Fetal Status: Fetal Heart Rate (bpm): 140 u/s   Movement: Absent    Anatomy u/s: [place] TA/TV:18 wks,breech,posterior placenta gr 0,normal ovaries,svp of fluid 6 cm,fhr 140 bpm,LVEICF 3.5 mm,limited view of heart because of fetal position and fetal age,please have pt come back for additional images,cx length 4.2 cm with and w/o pressure,EFW 243 G 75%   Results for orders placed or performed in visit on 10/02/20 (from the past 24 hour(s))  POC Urinalysis  Dipstick OB   Collection Time: 10/02/20  3:02 PM  Result Value Ref Range   Color, UA     Clarity, UA     Glucose, UA Negative Negative   Bilirubin, UA     Ketones, UA neg    Spec Grav, UA     Blood, UA neg    pH, UA     POC,PROTEIN,UA Negative Negative, Trace, Small (1+), Moderate (2+), Large (3+), 4+   Urobilinogen, UA     Nitrite, UA neg    Leukocytes, UA Negative Negative   Appearance     Odor      Assessment & Plan:  1) Low-risk pregnancy [id number] at [redacted]w[redacted]d with an Estimated Date of Delivery: 03/05/21    2) LVEICF, isolated, 3.22mm, no f/u needed, except that limited heart views were obtained today so she will have a f/u anatomy in 4wks  3) Hx PTL, cx length today 4.2cm w pressure; will follow again at repeat heart anatomy scan in 4wks  4) Dysuria, UA neg, urine to culture  5) Desires sterilization, will have sign papers @ 28wks (secondary MCD)   Meds: No orders of the defined types were placed in this encounter.  Labs/procedures today: anatomy u/s, 2nd IT, urine culture  Plan:  Continue routine obstetrical care with f/u heart anatomy scan due to limited views.  Reviewed: Preterm labor symptoms and general obstetric precautions including but not limited to vaginal bleeding, contractions, leaking of fluid and fetal movement were reviewed in detail with the patient.  All questions were answered. Has home bp cuff.  Check bp weekly, let us know  if >140/90.   Follow-up: Return in about 4 weeks (around 10/30/2020) for [place]: OB F/U for limited heart views, LROB, in person.  Orders Placed This Encounter  Procedures  . Urine Culture  . US OB Follow Up  . US OB Transvaginal  . INTEGRATED 2  . POC Urinalysis Dipstick OB   Arabella Merles [facility] 10/02/2020 5:30 PM

## 2020-10-02 NOTE — Progress Notes (Signed)
[place] TA/TV:18 wks,breech,posterior placenta gr 0,normal ovaries,svp of fluid 6 cm,fhr 140 bpm,LVEICF 3.5 mm,limited view of heart because of fetal position,please have pt come back for additional images,cx length 4.2 cm with and w/o pressure,EFW 243 G 75%

## 2020-10-02 NOTE — Patient Instructions (Signed)
Julia Levine, I greatly value your feedback.  If you receive a survey following your visit with [place] today, we appreciate you taking the time to fill it out.  Thanks, Philipp Deputy, CNM  [facility] ([address] [place], [place] [address]) Entrance C, located off of [address] valet parking  Go to [email or web address] to register for FREE online childbirth classes  [place] Pediatricians/Family Doctors:  [facility] [phone]            [facility] [phone]                 [facility] [phone] (usually not accepting new patients unless you have family there already, you are always welcome to call and ask)       [facility] Department [phone]       [facility] Pediatricians/Family Doctors:   [facility]: [phone]  [facility]: [phone]  [facility]: [phone]  [facility] Doctors:   [facility]: [phone]   [facility]: [phone]  [facility] Doctors:  [facility]: [phone]    Home Blood Pressure Monitoring for Patients   Your provider has recommended that you check your blood pressure (BP) at least once a week at home. If you do not have a blood pressure cuff at home, one will be provided for you. Contact your provider if you have not received your monitor within 1 week.   Helpful Tips for Accurate Home Blood Pressure Checks  . Don't smoke, exercise, or drink caffeine 30 minutes before checking your BP . Use the restroom before checking your BP (a full bladder can raise your pressure) . Relax in a comfortable upright chair . Feet on the ground . Left arm resting comfortably on a flat surface at the level of your heart . Legs uncrossed . Back supported . Sit quietly and don't talk . Place the cuff on your bare arm . Adjust snuggly, so that  only two fingertips can fit between your skin and the top of the cuff . Check 2 readings separated by at least one minute . Keep a log of your BP readings . For a visual, please reference this diagram: http://ccnc.care/bpdiagram  Provider Name: [facility]     Phone: [phone]  Zone 1: ALL CLEAR  Continue to monitor your symptoms:  . BP reading is less than 140 (top number) or less than 90 (bottom number)  . No right upper stomach pain . No headaches or seeing spots . No feeling nauseated or throwing up . No swelling in face and hands  Zone 2: CAUTION Call your doctor's office for any of the following:  . BP reading is greater than 140 (top number) or greater than 90 (bottom number)  . Stomach pain under your ribs in the middle or right side . Headaches or seeing spots . Feeling nauseated or throwing up . Swelling in face and hands  Zone 3: EMERGENCY  Seek immediate medical care if you have any of the following:  . BP reading is greater than160 (top number) or greater than 110 (bottom number) . Severe headaches not improving with Tylenol . Serious difficulty catching your breath . Any worsening symptoms from Zone 2     Second Trimester of Pregnancy The second trimester is from week 14 through week 27 (months 4 through 6). The second trimester is often a time when you feel your best.  Your body has adjusted to being pregnant, and you begin to feel better physically. Usually, morning sickness has lessened or quit completely, you may have more energy, and you may have an increase in appetite. The second trimester is also a time when the fetus is growing rapidly. At the end of the sixth month, the fetus is about 9 inches long and weighs about 1 pounds. You will likely begin to feel the baby move (quickening) between 16 and 20 weeks of pregnancy. Body changes during your second trimester Your body continues to go through many changes during your second trimester. The changes  vary from woman to woman.  Your weight will continue to increase. You will notice your lower abdomen bulging out.  You may begin to get stretch marks on your hips, abdomen, and breasts.  You may develop headaches that can be relieved by medicines. The medicines should be approved by your health care provider.  You may urinate more often because the fetus is pressing on your bladder.  You may develop or continue to have heartburn as a result of your pregnancy.  You may develop constipation because certain hormones are causing the muscles that push waste through your intestines to slow down.  You may develop hemorrhoids or swollen, bulging veins (varicose veins).  You may have back pain. This is caused by: ? Weight gain. ? Pregnancy hormones that are relaxing the joints in your pelvis. ? A shift in weight and the muscles that support your balance.  Your breasts will continue to grow and they will continue to become tender.  Your gums may bleed and may be sensitive to brushing and flossing.  Dark spots or blotches (chloasma, mask of pregnancy) may develop on your face. This will likely fade after the baby is born.  A dark line from your belly button to the pubic area (linea nigra) may appear. This will likely fade after the baby is born.  You may have changes in your hair. These can include thickening of your hair, rapid growth, and changes in texture. Some women also have hair loss during or after pregnancy, or hair that feels dry or thin. Your hair will most likely return to normal after your baby is born.  What to expect at prenatal visits During a routine prenatal visit:  You will be weighed to make sure you and the fetus are growing normally.  Your blood pressure will be taken.  Your abdomen will be measured to track your baby's growth.  The fetal heartbeat will be listened to.  Any test results from the previous visit will be discussed.  Your health care provider may  ask you:  How you are feeling.  If you are feeling the baby move.  If you have had any abnormal symptoms, such as leaking fluid, bleeding, severe headaches, or abdominal cramping.  If you are using any tobacco products, including cigarettes, chewing tobacco, and electronic cigarettes.  If you have any questions.  Other tests that may be performed during your second trimester include:  Blood tests that check for: ? Low iron levels (anemia). ? High blood sugar that affects pregnant women (gestational diabetes) between 73 and 28 weeks. ? Rh antibodies. This is to check for a protein on red blood cells (Rh factor).  Urine tests to check for infections, diabetes, or protein in the urine.  An ultrasound to confirm the proper growth and development of the baby.  An amniocentesis to check for possible genetic problems.  Fetal screens  for spina bifida and Down syndrome.  HIV (human immunodeficiency virus) testing. Routine prenatal testing includes screening for HIV, unless you choose not to have this test.  Follow these instructions at home: Medicines  Follow your health care provider's instructions regarding medicine use. Specific medicines may be either safe or unsafe to take during pregnancy.  Take a prenatal vitamin that contains at least 600 micrograms (mcg) of folic acid.  If you develop constipation, try taking a stool softener if your health care provider approves. Eating and drinking  Eat a balanced diet that includes fresh fruits and vegetables, whole grains, good sources of protein such as meat, eggs, or tofu, and low-fat dairy. Your health care provider will help you determine the amount of weight gain that is right for you.  Avoid raw meat and uncooked cheese. These carry germs that can cause birth defects in the baby.  If you have low calcium intake from food, talk to your health care provider about whether you should take a daily calcium supplement.  Limit foods  that are high in fat and processed sugars, such as fried and sweet foods.  To prevent constipation: ? Drink enough fluid to keep your urine clear or pale yellow. ? Eat foods that are high in fiber, such as fresh fruits and vegetables, whole grains, and beans. Activity  Exercise only as directed by your health care provider. Most women can continue their usual exercise routine during pregnancy. Try to exercise for 30 minutes at least 5 days a week. Stop exercising if you experience uterine contractions.  Avoid heavy lifting, wear low heel shoes, and practice good posture.  A sexual relationship may be continued unless your health care provider directs you otherwise. Relieving pain and discomfort  Wear a good support bra to prevent discomfort from breast tenderness.  Take warm sitz baths to soothe any pain or discomfort caused by hemorrhoids. Use hemorrhoid cream if your health care provider approves.  Rest with your legs elevated if you have leg cramps or low back pain.  If you develop varicose veins, wear support hose. Elevate your feet for 15 minutes, 3-4 times a day. Limit salt in your diet. Prenatal Care  Write down your questions. Take them to your prenatal visits.  Keep all your prenatal visits as told by your health care provider. This is important. Safety  Wear your seat belt at all times when driving.  Make a list of emergency phone numbers, including numbers for family, friends, the hospital, and police and fire departments. General instructions  Ask your health care provider for a referral to a local prenatal education class. Begin classes no later than the beginning of month 6 of your pregnancy.  Ask for help if you have counseling or nutritional needs during pregnancy. Your health care provider can offer advice or refer you to specialists for help with various needs.  Do not use hot tubs, steam rooms, or saunas.  Do not douche or use tampons or scented sanitary  pads.  Do not cross your legs for long periods of time.  Avoid cat litter boxes and soil used by cats. These carry germs that can cause birth defects in the baby and possibly loss of the fetus by miscarriage or stillbirth.  Avoid all smoking, herbs, alcohol, and unprescribed drugs. Chemicals in these products can affect the formation and growth of the baby.  Do not use any products that contain nicotine or tobacco, such as cigarettes and e-cigarettes. If you need help quitting,  ask your health care provider.  Visit your dentist if you have not gone yet during your pregnancy. Use a soft toothbrush to brush your teeth and be gentle when you floss. Contact a health care provider if:  You have dizziness.  You have mild pelvic cramps, pelvic pressure, or nagging pain in the abdominal area.  You have persistent nausea, vomiting, or diarrhea.  You have a bad smelling vaginal discharge.  You have pain when you urinate. Get help right away if:  You have a fever.  You are leaking fluid from your vagina.  You have spotting or bleeding from your vagina.  You have severe abdominal cramping or pain.  You have rapid weight gain or weight loss.  You have shortness of breath with chest pain.  You notice sudden or extreme swelling of your face, hands, ankles, feet, or legs.  You have not felt your baby move in over an hour.  You have severe headaches that do not go away when you take medicine.  You have vision changes. Summary  The second trimester is from week 14 through week 27 (months 4 through 6). It is also a time when the fetus is growing rapidly.  Your body goes through many changes during pregnancy. The changes vary from woman to woman.  Avoid all smoking, herbs, alcohol, and unprescribed drugs. These chemicals affect the formation and growth your baby.  Do not use any tobacco products, such as cigarettes, chewing tobacco, and e-cigarettes. If you need help quitting, ask your  health care provider.  Contact your health care provider if you have any questions. Keep all prenatal visits as told by your health care provider. This is important. This information is not intended to replace advice given to you by your health care provider. Make sure you discuss any questions you have with your health care provider. Document Released: 07/14/2001 Document Revised: 12/26/2015 Document Reviewed: 09/20/2012 [employer]vier Interactive Patient Education  2017 [employer].

## 2020-10-04 LAB — INTEGRATED 2
AFP MoM: 1.02
Alpha-Fetoprotein: 31.1 ng/mL
Crown Rump Length: 73.5 mm
DIA MoM: 0.62
DIA Value: 81.9 pg/mL
Estriol, Unconjugated: 1.49 ng/mL
Gest. Age on Collection Date: 13.4 weeks
Gestational Age: 17.3 weeks
Maternal Age at EDD: 34.3 yr
Nuchal Translucency (NT): 1.6 mm
Nuchal Translucency MoM: 0.95
Number of Fetuses: 1
PAPP-A MoM: 0.67
PAPP-A Value: 681.3 ng/mL
Test Results:: NEGATIVE
Weight: 199 [lb_av]
Weight: 199 [lb_av]
hCG MoM: 0.64
hCG Value: 15 IU/mL
uE3 MoM: 1.3

## 2020-10-05 LAB — URINE CULTURE

## 2020-10-30 ENCOUNTER — Ambulatory Visit (INDEPENDENT_AMBULATORY_CARE_PROVIDER_SITE_OTHER): Payer: Managed Care, Other (non HMO)

## 2020-10-30 ENCOUNTER — Other Ambulatory Visit: Payer: Self-pay

## 2020-10-30 ENCOUNTER — Encounter: Payer: Self-pay | Admitting: Women's Health

## 2020-10-30 ENCOUNTER — Ambulatory Visit (INDEPENDENT_AMBULATORY_CARE_PROVIDER_SITE_OTHER): Payer: Managed Care, Other (non HMO) | Admitting: Women's Health

## 2020-10-30 VITALS — BP 114/61 | HR 85 | Wt 208.6 lb

## 2020-10-30 DIAGNOSIS — O09299 Supervision of pregnancy with other poor reproductive or obstetric history, unspecified trimester: Secondary | ICD-10-CM | POA: Diagnosis not present

## 2020-10-30 DIAGNOSIS — Z363 Encounter for antenatal screening for malformations: Secondary | ICD-10-CM | POA: Diagnosis not present

## 2020-10-30 DIAGNOSIS — Z3480 Encounter for supervision of other normal pregnancy, unspecified trimester: Secondary | ICD-10-CM

## 2020-10-30 DIAGNOSIS — O09899 Supervision of other high risk pregnancies, unspecified trimester: Secondary | ICD-10-CM

## 2020-10-30 DIAGNOSIS — Z3482 Encounter for supervision of other normal pregnancy, second trimester: Secondary | ICD-10-CM

## 2020-10-30 DIAGNOSIS — Z302 Encounter for sterilization: Secondary | ICD-10-CM

## 2020-10-30 NOTE — Patient Instructions (Signed)
Julia Levine, I greatly value your feedback.  If you receive a survey following your visit with [place] today, we appreciate you taking the time to fill it out.  Thanks, Joellyn Haff, CNM, WHNP-BC  [facility] ([address] [place], [place] [address]) Entrance C, located off of [address] valet parking  Go to [email or web address] to register for FREE online childbirth classes  [place] Pediatricians/Family Doctors:  [facility] [phone]            [facility] [phone]                 [facility] [phone] (usually not accepting new patients unless you have family there already, you are always welcome to call and ask)       [facility] Department [phone]       [facility] Pediatricians/Family Doctors:   [facility]: [phone]  [facility]: [phone]  [facility]: [phone]  [facility] Doctors:   [facility]: [phone]   [facility]: [phone]  [facility] Doctors:  [facility]: [phone]    Home Blood Pressure Monitoring for Patients   Your provider has recommended that you check your blood pressure (BP) at least once a week at home. If you do not have a blood pressure cuff at home, one will be provided for you. Contact your provider if you have not received your monitor within 1 week.   Helpful Tips for Accurate Home Blood Pressure Checks  . Don't smoke, exercise, or drink caffeine 30 minutes before checking your BP . Use the restroom before checking your BP (a full bladder can raise your pressure) . Relax in a comfortable upright chair . Feet on the ground . Left arm resting comfortably on a flat surface at the level of your heart . Legs uncrossed . Back supported . Sit quietly and don't talk . Place the cuff on your bare arm . Adjust  snuggly, so that only two fingertips can fit between your skin and the top of the cuff . Check 2 readings separated by at least one minute . Keep a log of your BP readings . For a visual, please reference this diagram: http://ccnc.care/bpdiagram  Provider Name: [facility]     Phone: [phone]  Zone 1: ALL CLEAR  Continue to monitor your symptoms:  . BP reading is less than 140 (top number) or less than 90 (bottom number)  . No right upper stomach pain . No headaches or seeing spots . No feeling nauseated or throwing up . No swelling in face and hands  Zone 2: CAUTION Call your doctor's office for any of the following:  . BP reading is greater than 140 (top number) or greater than 90 (bottom number)  . Stomach pain under your ribs in the middle or right side . Headaches or seeing spots . Feeling nauseated or throwing up . Swelling in face and hands  Zone 3: EMERGENCY  Seek immediate medical care if you have any of the following:  . BP reading is greater than160 (top number) or greater than 110 (bottom number) . Severe headaches not improving with Tylenol . Serious difficulty catching your breath . Any worsening symptoms from Zone 2     Second Trimester of Pregnancy The second trimester is from week 14 through week 27 (months 4 through 6). The second trimester is often a time when you feel your  best. Your body has adjusted to being pregnant, and you begin to feel better physically. Usually, morning sickness has lessened or quit completely, you may have more energy, and you may have an increase in appetite. The second trimester is also a time when the fetus is growing rapidly. At the end of the sixth month, the fetus is about 9 inches long and weighs about 1 pounds. You will likely begin to feel the baby move (quickening) between 16 and 20 weeks of pregnancy. Body changes during your second trimester Your body continues to go through many changes during your second  trimester. The changes vary from woman to woman.  Your weight will continue to increase. You will notice your lower abdomen bulging out.  You may begin to get stretch marks on your hips, abdomen, and breasts.  You may develop headaches that can be relieved by medicines. The medicines should be approved by your health care provider.  You may urinate more often because the fetus is pressing on your bladder.  You may develop or continue to have heartburn as a result of your pregnancy.  You may develop constipation because certain hormones are causing the muscles that push waste through your intestines to slow down.  You may develop hemorrhoids or swollen, bulging veins (varicose veins).  You may have back pain. This is caused by: ? Weight gain. ? Pregnancy hormones that are relaxing the joints in your pelvis. ? A shift in weight and the muscles that support your balance.  Your breasts will continue to grow and they will continue to become tender.  Your gums may bleed and may be sensitive to brushing and flossing.  Dark spots or blotches (chloasma, mask of pregnancy) may develop on your face. This will likely fade after the baby is born.  A dark line from your belly button to the pubic area (linea nigra) may appear. This will likely fade after the baby is born.  You may have changes in your hair. These can include thickening of your hair, rapid growth, and changes in texture. Some women also have hair loss during or after pregnancy, or hair that feels dry or thin. Your hair will most likely return to normal after your baby is born.  What to expect at prenatal visits During a routine prenatal visit:  You will be weighed to make sure you and the fetus are growing normally.  Your blood pressure will be taken.  Your abdomen will be measured to track your baby's growth.  The fetal heartbeat will be listened to.  Any test results from the previous visit will be discussed.  Your  health care provider may ask you:  How you are feeling.  If you are feeling the baby move.  If you have had any abnormal symptoms, such as leaking fluid, bleeding, severe headaches, or abdominal cramping.  If you are using any tobacco products, including cigarettes, chewing tobacco, and electronic cigarettes.  If you have any questions.  Other tests that may be performed during your second trimester include:  Blood tests that check for: ? Low iron levels (anemia). ? High blood sugar that affects pregnant women (gestational diabetes) between 64 and 28 weeks. ? Rh antibodies. This is to check for a protein on red blood cells (Rh factor).  Urine tests to check for infections, diabetes, or protein in the urine.  An ultrasound to confirm the proper growth and development of the baby.  An amniocentesis to check for possible genetic problems.  Fetal  screens for spina bifida and Down syndrome.  HIV (human immunodeficiency virus) testing. Routine prenatal testing includes screening for HIV, unless you choose not to have this test.  Follow these instructions at home: Medicines  Follow your health care provider's instructions regarding medicine use. Specific medicines may be either safe or unsafe to take during pregnancy.  Take a prenatal vitamin that contains at least 600 micrograms (mcg) of folic acid.  If you develop constipation, try taking a stool softener if your health care provider approves. Eating and drinking  Eat a balanced diet that includes fresh fruits and vegetables, whole grains, good sources of protein such as meat, eggs, or tofu, and low-fat dairy. Your health care provider will help you determine the amount of weight gain that is right for you.  Avoid raw meat and uncooked cheese. These carry germs that can cause birth defects in the baby.  If you have low calcium intake from food, talk to your health care provider about whether you should take a daily calcium  supplement.  Limit foods that are high in fat and processed sugars, such as fried and sweet foods.  To prevent constipation: ? Drink enough fluid to keep your urine clear or pale yellow. ? Eat foods that are high in fiber, such as fresh fruits and vegetables, whole grains, and beans. Activity  Exercise only as directed by your health care provider. Most women can continue their usual exercise routine during pregnancy. Try to exercise for 30 minutes at least 5 days a week. Stop exercising if you experience uterine contractions.  Avoid heavy lifting, wear low heel shoes, and practice good posture.  A sexual relationship may be continued unless your health care provider directs you otherwise. Relieving pain and discomfort  Wear a good support bra to prevent discomfort from breast tenderness.  Take warm sitz baths to soothe any pain or discomfort caused by hemorrhoids. Use hemorrhoid cream if your health care provider approves.  Rest with your legs elevated if you have leg cramps or low back pain.  If you develop varicose veins, wear support hose. Elevate your feet for 15 minutes, 3-4 times a day. Limit salt in your diet. Prenatal Care  Write down your questions. Take them to your prenatal visits.  Keep all your prenatal visits as told by your health care provider. This is important. Safety  Wear your seat belt at all times when driving.  Make a list of emergency phone numbers, including numbers for family, friends, the hospital, and police and fire departments. General instructions  Ask your health care provider for a referral to a local prenatal education class. Begin classes no later than the beginning of month 6 of your pregnancy.  Ask for help if you have counseling or nutritional needs during pregnancy. Your health care provider can offer advice or refer you to specialists for help with various needs.  Do not use hot tubs, steam rooms, or saunas.  Do not douche or use  tampons or scented sanitary pads.  Do not cross your legs for long periods of time.  Avoid cat litter boxes and soil used by cats. These carry germs that can cause birth defects in the baby and possibly loss of the fetus by miscarriage or stillbirth.  Avoid all smoking, herbs, alcohol, and unprescribed drugs. Chemicals in these products can affect the formation and growth of the baby.  Do not use any products that contain nicotine or tobacco, such as cigarettes and e-cigarettes. If you need help  quitting, ask your health care provider.  Visit your dentist if you have not gone yet during your pregnancy. Use a soft toothbrush to brush your teeth and be gentle when you floss. Contact a health care provider if:  You have dizziness.  You have mild pelvic cramps, pelvic pressure, or nagging pain in the abdominal area.  You have persistent nausea, vomiting, or diarrhea.  You have a bad smelling vaginal discharge.  You have pain when you urinate. Get help right away if:  You have a fever.  You are leaking fluid from your vagina.  You have spotting or bleeding from your vagina.  You have severe abdominal cramping or pain.  You have rapid weight gain or weight loss.  You have shortness of breath with chest pain.  You notice sudden or extreme swelling of your face, hands, ankles, feet, or legs.  You have not felt your baby move in over an hour.  You have severe headaches that do not go away when you take medicine.  You have vision changes. Summary  The second trimester is from week 14 through week 27 (months 4 through 6). It is also a time when the fetus is growing rapidly.  Your body goes through many changes during pregnancy. The changes vary from woman to woman.  Avoid all smoking, herbs, alcohol, and unprescribed drugs. These chemicals affect the formation and growth your baby.  Do not use any tobacco products, such as cigarettes, chewing tobacco, and e-cigarettes. If you  need help quitting, ask your health care provider.  Contact your health care provider if you have any questions. Keep all prenatal visits as told by your health care provider. This is important. This information is not intended to replace advice given to you by your health care provider. Make sure you discuss any questions you have with your health care provider. Document Released: 07/14/2001 Document Revised: 12/26/2015 Document Reviewed: 09/20/2012 Elsevier Interactive Patient Education  2017 [employer].

## 2020-10-30 NOTE — Progress Notes (Signed)
[place] W/TV: 22 wks,cephalic,posterior placenta gr 0,SVP of fluid 5.4 cm,LVEICF 2.6 mm,anatomy of the heart complete,FHR 148 BPM,normal ovaries,EFW 522 G 76%,CX length 4.1-4.3 cm with and w/o pressure

## 2020-10-30 NOTE — Progress Notes (Signed)
   LOW-RISK PREGNANCY VISIT Patient name: Julia Levine MRN [id number]  Date of birth: 07-29-1986 Chief Complaint:   Routine Prenatal Visit and Pregnancy Ultrasound  History of Present Illness:   Julia Levine is a 35 y.o. [id number] female at [redacted]w[redacted]d with an Estimated Date of Delivery: 03/05/21 being seen today for ongoing management of a low-risk pregnancy.  Depression screen [facility] 2/9 09/04/2020 07/23/2020  Decreased Interest 0 0  Down, Depressed, Hopeless 0 0  PHQ - 2 Score 0 0  Altered sleeping 0 0  Tired, decreased energy 1 1  Change in appetite 1 1  Feeling bad or failure about yourself  0 0  Trouble concentrating 0 0  Moving slowly or fidgety/restless 0 0  Suicidal thoughts 0 0  PHQ-9 Score 2 2    Today she reports no complaints. Contractions: Not present. Vag. Bleeding: None.  Movement: Present. denies leaking of fluid. Review of Systems:   Pertinent items are noted in HPI Denies abnormal vaginal discharge w/ itching/odor/irritation, headaches, visual changes, shortness of breath, chest pain, abdominal pain, severe nausea/vomiting, or problems with urination or bowel movements unless otherwise stated above. Pertinent History Reviewed:  Reviewed past medical,surgical, social, obstetrical and family history.  Reviewed problem list, medications and allergies. Physical Assessment:   Vitals:   10/30/20 1140  BP: 114/61  Pulse: 85  Weight: 208 lb 9.6 oz (94.6 kg)  Body mass index is 32.67 kg/m.        Physical Examination:   General appearance: Well appearing, and in no distress  Mental status: Alert, oriented to person, place, and time  Skin: Warm & dry  Cardiovascular: Normal heart rate noted  Respiratory: Normal respiratory effort, no distress  Abdomen: Soft, gravid, nontender  Pelvic: Cervical exam deferred         Extremities: Edema: None  Fetal Status:     Movement: Present    [place] W/TV: 22 wks,cephalic,posterior placenta gr 0,SVP of fluid 5.4 cm,LVEICF  2.6 mm,anatomy of the heart complete,FHR 148 BPM,normal ovaries,EFW 522 G 76%,CX length 4.1-4.3 cm with and w/o pressure    Chaperone: N/A   No results found for this or any previous visit (from the past 24 hour(s)).  Assessment & Plan:  1) Low-risk pregnancy [id number] at [redacted]w[redacted]d with an Estimated Date of Delivery: 03/05/21   2) H/O 24wk PTB twins , CL q2wks until 28wks & nightly prometrium per LHE. CL today normal   Meds: No orders of the defined types were placed in this encounter.  Labs/procedures today: U/S  Plan:  Continue routine obstetrical care  Next visit: prefers will be in person for u/s    Reviewed: Preterm labor symptoms and general obstetric precautions including but not limited to vaginal bleeding, contractions, leaking of fluid and fetal movement were reviewed in detail with the patient.  All questions were answered. Does have home bp cuff. Office bp cuff given: not applicable. Check bp weekly, let us know if consistently >140 and/or >90.  Follow-up: Return in about 2 weeks (around 11/13/2020) for LROB, US:OB F/U cx, in person, CNM.  Future Appointments  Date Time Provider Department Center  11/18/2020  1:30 PM [facility] [place] [facility] None  11/18/2020  2:10 PM Neithan Day, Merlene Laughter, CNM [facility]    Orders Placed This Encounter  Procedures  . US OB Limited  . US OB Transvaginal   Cheral Marker CNM, [facility] 10/30/2020 11:56 AM

## 2020-11-18 ENCOUNTER — Ambulatory Visit (INDEPENDENT_AMBULATORY_CARE_PROVIDER_SITE_OTHER): Payer: Managed Care, Other (non HMO)

## 2020-11-18 ENCOUNTER — Ambulatory Visit (INDEPENDENT_AMBULATORY_CARE_PROVIDER_SITE_OTHER): Payer: Managed Care, Other (non HMO) | Admitting: Women's Health

## 2020-11-18 ENCOUNTER — Other Ambulatory Visit: Payer: Self-pay

## 2020-11-18 ENCOUNTER — Encounter: Payer: Self-pay | Admitting: Women's Health

## 2020-11-18 VITALS — BP 114/63 | HR 82 | Wt 206.0 lb

## 2020-11-18 DIAGNOSIS — Z3480 Encounter for supervision of other normal pregnancy, unspecified trimester: Secondary | ICD-10-CM

## 2020-11-18 DIAGNOSIS — Z3482 Encounter for supervision of other normal pregnancy, second trimester: Secondary | ICD-10-CM

## 2020-11-18 DIAGNOSIS — O09899 Supervision of other high risk pregnancies, unspecified trimester: Secondary | ICD-10-CM | POA: Diagnosis not present

## 2020-11-18 DIAGNOSIS — Z302 Encounter for sterilization: Secondary | ICD-10-CM

## 2020-11-18 DIAGNOSIS — Z8751 Personal history of pre-term labor: Secondary | ICD-10-CM

## 2020-11-18 NOTE — Progress Notes (Signed)
   LOW-RISK PREGNANCY VISIT Patient name: Julia Levine MRN [id number]  Date of birth: 07-13-1986 Chief Complaint:   Routine Prenatal Visit  History of Present Illness:   Julia Levine is a 35 y.o. [id number] female at [redacted]w[redacted]d with an Estimated Date of Delivery: 03/05/21 being seen today for ongoing management of a low-risk pregnancy.  Depression screen [facility] 2/9 09/04/2020 07/23/2020  Decreased Interest 0 0  Down, Depressed, Hopeless 0 0  PHQ - 2 Score 0 0  Altered sleeping 0 0  Tired, decreased energy 1 1  Change in appetite 1 1  Feeling bad or failure about yourself  0 0  Trouble concentrating 0 0  Moving slowly or fidgety/restless 0 0  Suicidal thoughts 0 0  PHQ-9 Score 2 2    Today she reports Lt foot more swollen than Rt. Contractions: Not present. Vag. Bleeding: None.  Movement: Present. denies leaking of fluid. Review of Systems:   Pertinent items are noted in HPI Denies abnormal vaginal discharge w/ itching/odor/irritation, headaches, visual changes, shortness of breath, chest pain, abdominal pain, severe nausea/vomiting, or problems with urination or bowel movements unless otherwise stated above. Pertinent History Reviewed:  Reviewed past medical,surgical, social, obstetrical and family history.  Reviewed problem list, medications and allergies. Physical Assessment:   Vitals:   11/18/20 1405  BP: 114/63  Pulse: 82  Weight: 206 lb (93.4 kg)  Body mass index is 32.26 kg/m.        Physical Examination:   General appearance: Well appearing, and in no distress  Mental status: Alert, oriented to person, place, and time  Skin: Warm & dry  Cardiovascular: Normal heart rate noted  Respiratory: Normal respiratory effort, no distress  Abdomen: Soft, gravid, nontender  Pelvic: Cervical exam deferred         Extremities: Edema: None, no evidence of DVT  Fetal Status:     Movement: Present    [place] 24+5 wks,cephalic,posterior placenta gr 0,normal ovaries,svp of fluid  5.3 cm,fhr 133 BPM, cx length 4.1-3.8 cm w/ and w/o pressure,LVEICF 2.3 mm  Chaperone: N/A   No results found for this or any previous visit (from the past 24 hour(s)).  Assessment & Plan:  1) Low-risk pregnancy [id number] at [redacted]w[redacted]d with an Estimated Date of Delivery: 03/05/21   2) H/O PPROM/PTB @ 24wks, prometrium qhs, CL q2wks, stable at 4.1-3.8cm w/ & w/o pressure   Meds: No orders of the defined types were placed in this encounter.  Labs/procedures today: U/S  Plan:  Continue routine obstetrical care  Next visit: prefers will be in person for pn2 & u/s    Reviewed: Preterm labor symptoms and general obstetric precautions including but not limited to vaginal bleeding, contractions, leaking of fluid and fetal movement were reviewed in detail with the patient.  All questions were answered. Does have home bp cuff. Office bp cuff given: not applicable. Check bp weekly, let us know if consistently >140 and/or >90.  Follow-up: Return in about 2 weeks (around 12/02/2020) for LROB, PN2, US:OB F/U cervix, in person, CNM.  No future appointments.  Orders Placed This Encounter  Procedures  . US OB Limited  . US OB Transvaginal   Cheral Marker CNM, [facility] 11/18/2020 2:40 PM

## 2020-11-18 NOTE — Progress Notes (Addendum)
[place] 24+5 wks,cephalic,posterior placenta gr 0,normal ovaries,svp of fluid 5.3 cm,fhr 133 BPM, cx length 4.1-3.8 cm w/ and w/o pressure,LVEICF 2.3 mm

## 2020-11-18 NOTE — Patient Instructions (Signed)
Enzo Bi, I greatly value your feedback.  If you receive a survey following your visit with [place] today, we appreciate you taking the time to fill it out.  Thanks, Joellyn Haff, CNM, WHNP-BC   You will have your sugar test next visit.  Please do not eat or drink anything after midnight the night before you come, not even water.  You will be here for at least two hours.  Please make an appointment online for the bloodwork at [email or web address] for 8:30am (or as close to this as possible). Make sure you select the [facility] service center. The day of the appointment, check in with our office first, then you will go to [facility] to start the sugar test.    [facility] [place], [place] [address]) Entrance C, located off of [address] valet parking  Go to [email or web address] to register for FREE online childbirth classes   Call the office [phone]) or go to [facility] if:  You begin to have strong, frequent contractions  Your water breaks.  Sometimes it is a big gush of fluid, sometimes it is just a trickle that keeps getting your panties wet or running down your legs  You have vaginal bleeding.  It is normal to have a small amount of spotting if your cervix was checked.   You don't feel your baby moving like normal.  If you don't, get you something to eat and drink and lay down and focus on feeling your baby move.   If your baby is still not moving like normal, you should call the office or go to [facility].  [place] Pediatricians/Family Doctors:  [facility] [phone]            [facility] [phone]                 [facility] [phone] (usually not accepting new patients unless you have family there already, you are always welcome to call and ask)       [facility] Department [phone]       [facility] Pediatricians/Family Doctors:   [facility]: [phone]  [facility]: [phone]  [facility]: [phone]  [facility] Doctors:   [facility]: [phone]   [facility]: [phone]  [facility] Doctors:  [facility]: [phone]   Home Blood Pressure Monitoring for Patients   Your provider has recommended that you check your blood pressure (BP) at least once a week at home. If you do not have a blood pressure cuff at home, one will be provided for you. Contact your provider if you have not received your monitor within 1 week.   Helpful Tips for Accurate Home Blood Pressure Checks  . Don't smoke, exercise, or drink caffeine 30 minutes before checking your BP . Use the restroom before checking your BP (a full bladder can raise your pressure) . Relax in a comfortable upright chair . Feet on the ground . Left arm resting comfortably on a flat surface at the level of your heart . Legs uncrossed . Back supported . Sit quietly and don't talk . Place the cuff on your bare arm . Adjust snuggly, so that only two fingertips can fit between your skin and the top of the cuff . Check 2 readings separated by at least one minute . Keep a log of your BP readings . For a visual, please  reference this diagram: http://ccnc.care/bpdiagram  Provider Name: [facility]     Phone: [phone]  Zone 1: ALL CLEAR  Continue to monitor your symptoms:  . BP reading is less than 140 (top number) or less than 90 (bottom number)  . No right upper stomach pain . No headaches or seeing spots . No feeling nauseated or throwing up . No swelling in face and hands  Zone 2: CAUTION Call your doctor's office for any of the following:  . BP reading is greater than 140 (top number) or greater than 90 (bottom number)  . Stomach pain under your ribs in the middle or right side . Headaches or seeing spots . Feeling nauseated or throwing  up . Swelling in face and hands  Zone 3: EMERGENCY  Seek immediate medical care if you have any of the following:  . BP reading is greater than160 (top number) or greater than 110 (bottom number) . Severe headaches not improving with Tylenol . Serious difficulty catching your breath . Any worsening symptoms from Zone 2   Second Trimester of Pregnancy The second trimester is from week 13 through week 28, months 4 through 6. The second trimester is often a time when you feel your best. Your body has also adjusted to being pregnant, and you begin to feel better physically. Usually, morning sickness has lessened or quit completely, you may have more energy, and you may have an increase in appetite. The second trimester is also a time when the fetus is growing rapidly. At the end of the sixth month, the fetus is about 9 inches long and weighs about 1 pounds. You will likely begin to feel the baby move (quickening) between 18 and 20 weeks of the pregnancy. BODY CHANGES Your body goes through many changes during pregnancy. The changes vary from woman to woman.   Your weight will continue to increase. You will notice your lower abdomen bulging out.  You may begin to get stretch marks on your hips, abdomen, and breasts.  You may develop headaches that can be relieved by medicines approved by your health care provider.  You may urinate more often because the fetus is pressing on your bladder.  You may develop or continue to have heartburn as a result of your pregnancy.  You may develop constipation because certain hormones are causing the muscles that push waste through your intestines to slow down.  You may develop hemorrhoids or swollen, bulging veins (varicose veins).  You may have back pain because of the weight gain and pregnancy hormones relaxing your joints between the bones in your pelvis and as a result of a shift in weight and the muscles that support your balance.  Your breasts will  continue to grow and be tender.  Your gums may bleed and may be sensitive to brushing and flossing.  Dark spots or blotches (chloasma, mask of pregnancy) may develop on your face. This will likely fade after the baby is born.  A dark line from your belly button to the pubic area (linea nigra) may appear. This will likely fade after the baby is born.  You may have changes in your hair. These can include thickening of your hair, rapid growth, and changes in texture. Some women also have hair loss during or after pregnancy, or hair that feels dry or thin. Your hair will most likely return to normal after your baby is born. WHAT TO EXPECT AT YOUR PRENATAL VISITS During a routine prenatal visit:  You  will be weighed to make sure you and the fetus are growing normally.  Your blood pressure will be taken.  Your abdomen will be measured to track your baby's growth.  The fetal heartbeat will be listened to.  Any test results from the previous visit will be discussed. Your health care provider may ask you:  How you are feeling.  If you are feeling the baby move.  If you have had any abnormal symptoms, such as leaking fluid, bleeding, severe headaches, or abdominal cramping.  If you have any questions. Other tests that may be performed during your second trimester include:  Blood tests that check for:  Low iron levels (anemia).  Gestational diabetes (between 24 and 28 weeks).  Rh antibodies.  Urine tests to check for infections, diabetes, or protein in the urine.  An ultrasound to confirm the proper growth and development of the baby.  An amniocentesis to check for possible genetic problems.  Fetal screens for spina bifida and Down syndrome. HOME CARE INSTRUCTIONS   Avoid all smoking, herbs, alcohol, and unprescribed drugs. These chemicals affect the formation and growth of the baby.  Follow your health care provider's instructions regarding medicine use. There are medicines  that are either safe or unsafe to take during pregnancy.  Exercise only as directed by your health care provider. Experiencing uterine cramps is a good sign to stop exercising.  Continue to eat regular, healthy meals.  Wear a good support bra for breast tenderness.  Do not use hot tubs, steam rooms, or saunas.  Wear your seat belt at all times when driving.  Avoid raw meat, uncooked cheese, cat litter boxes, and soil used by cats. These carry germs that can cause birth defects in the baby.  Take your prenatal vitamins.  Try taking a stool softener (if your health care provider approves) if you develop constipation. Eat more high-fiber foods, such as fresh vegetables or fruit and whole grains. Drink plenty of fluids to keep your urine clear or pale yellow.  Take warm sitz baths to soothe any pain or discomfort caused by hemorrhoids. Use hemorrhoid cream if your health care provider approves.  If you develop varicose veins, wear support hose. Elevate your feet for 15 minutes, 3-4 times a day. Limit salt in your diet.  Avoid heavy lifting, wear low heel shoes, and practice good posture.  Rest with your legs elevated if you have leg cramps or low back pain.  Visit your dentist if you have not gone yet during your pregnancy. Use a soft toothbrush to brush your teeth and be gentle when you floss.  A sexual relationship may be continued unless your health care provider directs you otherwise.  Continue to go to all your prenatal visits as directed by your health care provider. SEEK MEDICAL CARE IF:   You have dizziness.  You have mild pelvic cramps, pelvic pressure, or nagging pain in the abdominal area.  You have persistent nausea, vomiting, or diarrhea.  You have a bad smelling vaginal discharge.  You have pain with urination. SEEK IMMEDIATE MEDICAL CARE IF:   You have a fever.  You are leaking fluid from your vagina.  You have spotting or bleeding from your vagina.  You  have severe abdominal cramping or pain.  You have rapid weight gain or loss.  You have shortness of breath with chest pain.  You notice sudden or extreme swelling of your face, hands, ankles, feet, or legs.  You have not felt your baby move  in over an hour.  You have severe headaches that do not go away with medicine.  You have vision changes. Document Released: 07/14/2001 Document Revised: 07/25/2013 Document Reviewed: 09/20/2012 [facility] Patient Information 2015 [place], [place]. This information is not intended to replace advice given to you by your health care provider. Make sure you discuss any questions you have with your health care provider.

## 2020-11-28 ENCOUNTER — Ambulatory Visit (INDEPENDENT_AMBULATORY_CARE_PROVIDER_SITE_OTHER): Payer: Managed Care, Other (non HMO) | Admitting: Women's Health

## 2020-11-28 ENCOUNTER — Encounter: Payer: Self-pay | Admitting: Women's Health

## 2020-11-28 ENCOUNTER — Ambulatory Visit (INDEPENDENT_AMBULATORY_CARE_PROVIDER_SITE_OTHER): Payer: Managed Care, Other (non HMO)

## 2020-11-28 ENCOUNTER — Other Ambulatory Visit: Payer: Self-pay

## 2020-11-28 VITALS — BP 109/61 | HR 86 | Wt 212.0 lb

## 2020-11-28 DIAGNOSIS — Z3480 Encounter for supervision of other normal pregnancy, unspecified trimester: Secondary | ICD-10-CM

## 2020-11-28 DIAGNOSIS — Z8751 Personal history of pre-term labor: Secondary | ICD-10-CM

## 2020-11-28 DIAGNOSIS — Z3482 Encounter for supervision of other normal pregnancy, second trimester: Secondary | ICD-10-CM | POA: Diagnosis not present

## 2020-11-28 DIAGNOSIS — Z302 Encounter for sterilization: Secondary | ICD-10-CM

## 2020-11-28 NOTE — Patient Instructions (Signed)
Julia Levine, I greatly value your feedback.  If you receive a survey following your visit with us today, we appreciate you taking the time to fill it out.  Thanks, Kim Bill Yohn, CNM, WHNP-BC   You will have your sugar test next visit.  Please do not eat or drink anything after midnight the night before you come, not even water.  You will be here for at least two hours.  Please make an appointment online for the bloodwork at [email or web address] for 8:30am (or as close to this as possible). Make sure you select the [address] service center. The day of the appointment, check in with our office first, then you will go to [facility] to start the sugar test.    [facility] ([address] [place], [place] [address]) Entrance C, located off of [address] Free 24/7 valet parking  Go to [email or web address] to register for FREE online childbirth classes   Call the office [phone]) or go to [facility] if:  You begin to have strong, frequent contractions  Your water breaks.  Sometimes it is a big gush of fluid, sometimes it is just a trickle that keeps getting your panties wet or running down your legs  You have vaginal bleeding.  It is normal to have a small amount of spotting if your cervix was checked.   You don't feel your baby moving like normal.  If you don't, get you something to eat and drink and lay down and focus on feeling your baby move.   If your baby is still not moving like normal, you should call the office or go to [facility].  [place] Pediatricians/Family Doctors:  [facility] [phone]            [facility] [phone]                 [facility] [phone] (usually not accepting new patients unless you have family there already, you are always welcome to call and ask)       [facility] [phone]       [place] Pediatricians/Family Doctors:   [facility]: [phone]  [facility]: [phone]  [facility]: [phone]  [place] Family Doctors:   [facility]: [phone]   [facility]: [phone]  [place] Family Doctors:  [facility]: [phone]   Home Blood Pressure Monitoring for Patients   Your provider has recommended that you check your blood pressure (BP) at least once a week at home. If you do not have a blood pressure cuff at home, one will be provided for you. Contact your provider if you have not received your monitor within 1 week.   Helpful Tips for Accurate Home Blood Pressure Checks  . Don't smoke, exercise, or drink caffeine 30 minutes before checking your BP . Use the restroom before checking your BP (a full bladder can raise your pressure) . Relax in a comfortable upright chair . Feet on the ground . Left arm resting comfortably on a flat surface at the level of your heart . Legs uncrossed . Back supported . Sit quietly and don't talk . Place the cuff on your bare arm . Adjust snuggly, so that only two fingertips can fit between your skin and the top of the cuff . Check 2 readings separated by at least one minute . Keep a log of your BP readings . For a visual, please   reference this diagram: http://ccnc.care/bpdiagram  Provider Name: [facility]     Phone: [phone]  Zone 1: ALL CLEAR  Continue to monitor your symptoms:  . BP reading is less than 140 (top number) or less than 90 (bottom number)  . No right upper stomach pain . No headaches or seeing spots . No feeling nauseated or throwing up . No swelling in face and hands  Zone 2: CAUTION Call your doctor's office for any of the following:  . BP reading is greater than 140 (top number) or greater than 90 (bottom number)  . Stomach pain under your ribs in the middle or right side . Headaches or seeing spots . Feeling nauseated or throwing  up . Swelling in face and hands  Zone 3: EMERGENCY  Seek immediate medical care if you have any of the following:  . BP reading is greater than160 (top number) or greater than 110 (bottom number) . Severe headaches not improving with Tylenol . Serious difficulty catching your breath . Any worsening symptoms from Zone 2   Second Trimester of Pregnancy The second trimester is from week 13 through week 28, months 4 through 6. The second trimester is often a time when you feel your best. Your body has also adjusted to being pregnant, and you begin to feel better physically. Usually, morning sickness has lessened or quit completely, you may have more energy, and you may have an increase in appetite. The second trimester is also a time when the fetus is growing rapidly. At the end of the sixth month, the fetus is about 9 inches long and weighs about 1 pounds. You will likely begin to feel the baby move (quickening) between 18 and 20 weeks of the pregnancy. BODY CHANGES Your body goes through many changes during pregnancy. The changes vary from woman to woman.   Your weight will continue to increase. You will notice your lower abdomen bulging out.  You may begin to get stretch marks on your hips, abdomen, and breasts.  You may develop headaches that can be relieved by medicines approved by your health care provider.  You may urinate more often because the fetus is pressing on your bladder.  You may develop or continue to have heartburn as a result of your pregnancy.  You may develop constipation because certain hormones are causing the muscles that push waste through your intestines to slow down.  You may develop hemorrhoids or swollen, bulging veins (varicose veins).  You may have back pain because of the weight gain and pregnancy hormones relaxing your joints between the bones in your pelvis and as a result of a shift in weight and the muscles that support your balance.  Your breasts will  continue to grow and be tender.  Your gums may bleed and may be sensitive to brushing and flossing.  Dark spots or blotches (chloasma, mask of pregnancy) may develop on your face. This will likely fade after the baby is born.  A dark line from your belly button to the pubic area (linea nigra) may appear. This will likely fade after the baby is born.  You may have changes in your hair. These can include thickening of your hair, rapid growth, and changes in texture. Some women also have hair loss during or after pregnancy, or hair that feels dry or thin. Your hair will most likely return to normal after your baby is born. WHAT TO EXPECT AT YOUR PRENATAL VISITS During a routine prenatal visit:  You  will be weighed to make sure you and the fetus are growing normally.  Your blood pressure will be taken.  Your abdomen will be measured to track your baby's growth.  The fetal heartbeat will be listened to.  Any test results from the previous visit will be discussed. Your health care provider may ask you:  How you are feeling.  If you are feeling the baby move.  If you have had any abnormal symptoms, such as leaking fluid, bleeding, severe headaches, or abdominal cramping.  If you have any questions. Other tests that may be performed during your second trimester include:  Blood tests that check for:  Low iron levels (anemia).  Gestational diabetes (between 24 and 28 weeks).  Rh antibodies.  Urine tests to check for infections, diabetes, or protein in the urine.  An ultrasound to confirm the proper growth and development of the baby.  An amniocentesis to check for possible genetic problems.  Fetal screens for spina bifida and Down syndrome. HOME CARE INSTRUCTIONS   Avoid all smoking, herbs, alcohol, and unprescribed drugs. These chemicals affect the formation and growth of the baby.  Follow your health care provider's instructions regarding medicine use. There are medicines  that are either safe or unsafe to take during pregnancy.  Exercise only as directed by your health care provider. Experiencing uterine cramps is a good sign to stop exercising.  Continue to eat regular, healthy meals.  Wear a good support bra for breast tenderness.  Do not use hot tubs, steam rooms, or saunas.  Wear your seat belt at all times when driving.  Avoid raw meat, uncooked cheese, cat litter boxes, and soil used by cats. These carry germs that can cause birth defects in the baby.  Take your prenatal vitamins.  Try taking a stool softener (if your health care provider approves) if you develop constipation. Eat more high-fiber foods, such as fresh vegetables or fruit and whole grains. Drink plenty of fluids to keep your urine clear or pale yellow.  Take warm sitz baths to soothe any pain or discomfort caused by hemorrhoids. Use hemorrhoid cream if your health care provider approves.  If you develop varicose veins, wear support hose. Elevate your feet for 15 minutes, 3-4 times a day. Limit salt in your diet.  Avoid heavy lifting, wear low heel shoes, and practice good posture.  Rest with your legs elevated if you have leg cramps or low back pain.  Visit your dentist if you have not gone yet during your pregnancy. Use a soft toothbrush to brush your teeth and be gentle when you floss.  A sexual relationship may be continued unless your health care provider directs you otherwise.  Continue to go to all your prenatal visits as directed by your health care provider. SEEK MEDICAL CARE IF:   You have dizziness.  You have mild pelvic cramps, pelvic pressure, or nagging pain in the abdominal area.  You have persistent nausea, vomiting, or diarrhea.  You have a bad smelling vaginal discharge.  You have pain with urination. SEEK IMMEDIATE MEDICAL CARE IF:   You have a fever.  You are leaking fluid from your vagina.  You have spotting or bleeding from your vagina.  You  have severe abdominal cramping or pain.  You have rapid weight gain or loss.  You have shortness of breath with chest pain.  You notice sudden or extreme swelling of your face, hands, ankles, feet, or legs.  You have not felt your baby move  in over an hour.  You have severe headaches that do not go away with medicine.  You have vision changes. Document Released: 07/14/2001 Document Revised: 07/25/2013 Document Reviewed: 09/20/2012 [facility] Patient Information 2015 [place], [place]. This information is not intended to replace advice given to you by your health care provider. Make sure you discuss any questions you have with your health care provider.

## 2020-11-28 NOTE — Progress Notes (Signed)
[place] 26+1 wks,cephalic,posterior placenta gr 0,normal ovaries,cx length w and w/o pressure 4.1-4.2 cm,fhr 138 bpm,AFI 16.7 cm,LVEICF 3.1 mm

## 2020-11-28 NOTE — Progress Notes (Signed)
LOW-RISK PREGNANCY VISIT Patient name: Julia Levine MRN [id number]  Date of birth: 12/16/1985 Chief Complaint:   Routine Prenatal Visit ([place] today; + pressure)  History of Present Illness:   Julia Levine is a 35 y.o. [phone] female at [redacted]w[redacted]d with an Estimated Date of Delivery: 03/05/21 being seen today for ongoing management of a low-risk pregnancy. Last saw pt 4/18, planned for pt to come back around 5/2 for u/s, visit, and pn2- however there were no u/s available then, so they scheduled her for today. Depression screen [facility] 2/9 09/04/2020 07/23/2020  Decreased Interest 0 0  Down, Depressed, Hopeless 0 0  PHQ - 2 Score 0 0  Altered sleeping 0 0  Tired, decreased energy 1 1  Change in appetite 1 1  Feeling bad or failure about yourself  0 0  Trouble concentrating 0 0  Moving slowly or fidgety/restless 0 0  Suicidal thoughts 0 0  PHQ-9 Score 2 2    Today she reports pressure. Contractions: Not present. Vag. Bleeding: None.  Movement: Present. denies leaking of fluid. Review of Systems:   Pertinent items are noted in HPI Denies abnormal vaginal discharge w/ itching/odor/irritation, headaches, visual changes, shortness of breath, chest pain, abdominal pain, severe nausea/vomiting, or problems with urination or bowel movements unless otherwise stated above. Pertinent History Reviewed:  Reviewed past medical,surgical, social, obstetrical and family history.  Reviewed problem list, medications and allergies. Physical Assessment:   Vitals:   11/28/20 0912  BP: 109/61  Pulse: 86  Weight: 212 lb (96.2 kg)  Body mass index is 33.2 kg/m.        Physical Examination:   General appearance: Well appearing, and in no distress  Mental status: Alert, oriented to person, place, and time  Skin: Warm & dry  Cardiovascular: Normal heart rate noted  Respiratory: Normal respiratory effort, no distress  Abdomen: Soft, gravid, nontender  Pelvic: Cervical exam deferred          Extremities: Edema: Trace  Fetal Status:     Movement: Present    [place] 26+1 wks,cephalic,posterior placenta gr 0,normal ovaries,cx length w and w/o pressure 4.1-4.2 cm,fhr 138 bpm,AFI 16.7 cm,LVEICF 3.1 mm  Chaperone: N/A   No results found for this or any previous visit (from the past 24 hour(s)).  Assessment & Plan:  1) Low-risk pregnancy [id number] at [redacted]w[redacted]d with an Estimated Date of Delivery: 03/05/21   2) H/O 24wk PPROM PTB twins, w/ demise, doing CL q 2wks until 28wks, CL 4.1-4.2cm today. Continue prometrium  3) Pressure> has ordered a maternity belt, hasn't gotten it in yet   Meds: No orders of the defined types were placed in this encounter.  Labs/procedures today: U/S  Plan:  Continue routine obstetrical care  Next visit: prefers will be in person for pn2    Reviewed: Preterm labor symptoms and general obstetric precautions including but not limited to vaginal bleeding, contractions, leaking of fluid and fetal movement were reviewed in detail with the patient.  All questions were answered. Does have home bp cuff. Office bp cuff given: not applicable. Check bp weekly, let us know if consistently >140 and/or >90.  Follow-up: Return in about 2 weeks (around 12/12/2020) for LROB, PN2, US:OB F/U cervix, in person, CNM (cancel 5/3 appts/labs please).   Future Appointments  Date Time Provider Department Center  12/03/2020  8:50 AM [facility]  12/03/2020 11:30 AM Cheral Marker, CNM [facility]    Orders Placed This Encounter  Procedures  .  US OB Transvaginal  . US OB Limited   Cheral Marker CNM, [facility] 11/28/2020 9:34 AM

## 2020-12-03 ENCOUNTER — Encounter: Payer: Managed Care, Other (non HMO) | Admitting: Women's Health

## 2020-12-03 ENCOUNTER — Other Ambulatory Visit: Payer: Managed Care, Other (non HMO)

## 2020-12-18 ENCOUNTER — Other Ambulatory Visit: Payer: Self-pay

## 2020-12-18 ENCOUNTER — Ambulatory Visit (INDEPENDENT_AMBULATORY_CARE_PROVIDER_SITE_OTHER): Payer: Managed Care, Other (non HMO) | Admitting: Obstetrics and Gynecology

## 2020-12-18 ENCOUNTER — Other Ambulatory Visit: Payer: Managed Care, Other (non HMO)

## 2020-12-18 ENCOUNTER — Encounter: Payer: Self-pay | Admitting: Obstetrics and Gynecology

## 2020-12-18 VITALS — BP 127/73 | HR 94 | Wt 217.2 lb

## 2020-12-18 DIAGNOSIS — Z3A28 28 weeks gestation of pregnancy: Secondary | ICD-10-CM

## 2020-12-18 DIAGNOSIS — Z8751 Personal history of pre-term labor: Secondary | ICD-10-CM

## 2020-12-18 DIAGNOSIS — Z3480 Encounter for supervision of other normal pregnancy, unspecified trimester: Secondary | ICD-10-CM

## 2020-12-18 DIAGNOSIS — Z302 Encounter for sterilization: Secondary | ICD-10-CM

## 2020-12-18 NOTE — Patient Instructions (Signed)

## 2020-12-18 NOTE — Progress Notes (Signed)
Subjective:  Julia Levine is a 35 y.o. [phone] at [redacted]w[redacted]d being seen today for ongoing prenatal care.  She is currently monitored for the following issues for this high-risk pregnancy and has Dysplasia of cervix, high grade CIN 2; Encounter for supervision of normal pregnancy, antepartum; History of preterm delivery; and Request for sterilization on their problem list.  Patient reports no complaints.  Contractions: Not present. Vag. Bleeding: None.  Movement: Present. Denies leaking of fluid.   The following portions of the patient's history were reviewed and updated as appropriate: allergies, current medications, past family history, past medical history, past social history, past surgical history and problem list. Problem list updated.  Objective:   Vitals:   12/18/20 0849  BP: 127/73  Pulse: 94  Weight: 217 lb 3.2 oz (98.5 kg)    Fetal Status:     Movement: Present     General:  Alert, oriented and cooperative. Patient is in no acute distress.  Skin: Skin is warm and dry. No rash noted.   Cardiovascular: Normal heart rate noted  Respiratory: Normal respiratory effort, no problems with respiration noted  Abdomen: Soft, gravid, appropriate for gestational age. Pain/Pressure: Present     Pelvic:  Cervical exam deferred        Extremities: Normal range of motion.  Edema: Trace  Mental Status: Normal mood and affect. Normal behavior. Normal judgment and thought content.   Urinalysis:      Assessment and Plan:  Pregnancy: [id number] at [redacted]w[redacted]d  1. Supervision of other normal pregnancy, antepartum Stable Glucola today   2. History of preterm delivery Stable Will continue with Prometrium qhs  3. Request for sterilization BTL papers signed today  Preterm labor symptoms and general obstetric precautions including but not limited to vaginal bleeding, contractions, leaking of fluid and fetal movement were reviewed in detail with the patient. Please refer to After Visit Summary  for other counseling recommendations.  Return in about 2 weeks (around 01/01/2021) for OB visit, face to face, any provider.   Hermina Staggers, MD

## 2020-12-19 LAB — HIV ANTIBODY (ROUTINE TESTING W REFLEX): HIV Screen 4th Generation wRfx: NONREACTIVE

## 2020-12-19 LAB — CBC
Hematocrit: 34.8 % (ref 34.0–46.6)
Hemoglobin: 11.4 g/dL (ref 11.1–15.9)
MCH: 31.3 pg (ref 26.6–33.0)
MCHC: 32.8 g/dL (ref 31.5–35.7)
MCV: 96 fL (ref 79–97)
Platelets: 160 10*3/uL (ref 150–450)
RBC: 3.64 x10E6/uL — ABNORMAL LOW (ref 3.77–5.28)
RDW: 12.7 % (ref 11.7–15.4)
WBC: 7.9 10*3/uL (ref 3.4–10.8)

## 2020-12-19 LAB — RPR: RPR Ser Ql: NONREACTIVE

## 2020-12-19 LAB — GLUCOSE TOLERANCE, 2 HOURS W/ 1HR
Glucose, 1 hour: 113 mg/dL (ref 65–179)
Glucose, 2 hour: 97 mg/dL (ref 65–152)
Glucose, Fasting: 75 mg/dL (ref 65–91)

## 2020-12-19 LAB — ANTIBODY SCREEN: Antibody Screen: NEGATIVE

## 2021-01-02 ENCOUNTER — Other Ambulatory Visit: Payer: Self-pay | Admitting: Women's Health

## 2021-01-07 ENCOUNTER — Ambulatory Visit (INDEPENDENT_AMBULATORY_CARE_PROVIDER_SITE_OTHER): Payer: Managed Care, Other (non HMO) | Admitting: Women's Health

## 2021-01-07 ENCOUNTER — Encounter: Payer: Self-pay | Admitting: Women's Health

## 2021-01-07 ENCOUNTER — Other Ambulatory Visit: Payer: Self-pay

## 2021-01-07 VITALS — BP 124/65 | HR 94 | Wt 223.0 lb

## 2021-01-07 DIAGNOSIS — Z3A31 31 weeks gestation of pregnancy: Secondary | ICD-10-CM

## 2021-01-07 DIAGNOSIS — Z23 Encounter for immunization: Secondary | ICD-10-CM | POA: Diagnosis not present

## 2021-01-07 DIAGNOSIS — Z3480 Encounter for supervision of other normal pregnancy, unspecified trimester: Secondary | ICD-10-CM

## 2021-01-07 DIAGNOSIS — Z3483 Encounter for supervision of other normal pregnancy, third trimester: Secondary | ICD-10-CM

## 2021-01-07 NOTE — Progress Notes (Signed)
    LOW-RISK PREGNANCY VISIT Patient name: Julia Levine MRN [id number]  Date of birth: 07-30-1986 Chief Complaint:   No chief complaint on file.  History of Present Illness:   Elizabeth Paulsen is a 35 y.o. [phone] female at [redacted]w[redacted]d with an Estimated Date of Delivery: 03/05/21 being seen today for ongoing management of a low-risk pregnancy.   Today she reports no complaints. Contractions: Not present. Vag. Bleeding: None.  Movement: Present. denies leaking of fluid.  Depression screen [facility] 2/9 12/18/2020 09/04/2020 07/23/2020  Decreased Interest 0 0 0  Down, Depressed, Hopeless 0 0 0  PHQ - 2 Score 0 0 0  Altered sleeping 2 0 0  Tired, decreased energy 1 1 1   Change in appetite 0 1 1  Feeling bad or failure about yourself  0 0 0  Trouble concentrating 0 0 0  Moving slowly or fidgety/restless 0 0 0  Suicidal thoughts 0 0 0  PHQ-9 Score 3 2 2      GAD 7 : Generalized Anxiety Score 12/18/2020 09/04/2020 07/23/2020  Nervous, Anxious, on Edge 1 0 0  Control/stop worrying 0 0 0  Worry too much - different things 0 0 0  Trouble relaxing 0 0 0  Restless 0 0 0  Easily annoyed or irritable 1 1 0  Afraid - awful might happen 0 0 0  Total GAD 7 Score 2 1 0      Review of Systems:   Pertinent items are noted in HPI Denies abnormal vaginal discharge w/ itching/odor/irritation, headaches, visual changes, shortness of breath, chest pain, abdominal pain, severe nausea/vomiting, or problems with urination or bowel movements unless otherwise stated above. Pertinent History Reviewed:  Reviewed past medical,surgical, social, obstetrical and family history.  Reviewed problem list, medications and allergies. Physical Assessment:   Vitals:   01/07/21 0917  BP: 124/65  Pulse: 94  Weight: 223 lb (101.2 kg)  Body mass index is 34.93 kg/m.        Physical Examination:   General appearance: Well appearing, and in no distress  Mental status: Alert, oriented to person, place, and  time  Skin: Warm & dry  Cardiovascular: Normal heart rate noted  Respiratory: Normal respiratory effort, no distress  Abdomen: Soft, gravid, nontender  Pelvic: Cervical exam deferred         Extremities: Edema: Trace  Fetal Status: Fetal Heart Rate (bpm): 130 Fundal Height: 31 cm Movement: Present    Chaperone: N/A   No results found for this or any previous visit (from the past 24 hour(s)).  Assessment & Plan:  1) Low-risk pregnancy 07/25/2020 at [redacted]w[redacted]d with an Estimated Date of Delivery: 03/05/21   2) H/O 24wk PPROM w/ twin demise, on prometrium q hs   Meds: No orders of the defined types were placed in this encounter.  Labs/procedures today: tdap  Plan:  Continue routine obstetrical care  Next visit: prefers in person    Reviewed: Preterm labor symptoms and general obstetric precautions including but not limited to vaginal bleeding, contractions, leaking of fluid and fetal movement were reviewed in detail with the patient.  All questions were answered. Does have home bp cuff. Office bp cuff given: not applicable. Check bp weekly, let [redacted]w[redacted]d know if consistently >140 and/or >90.  Follow-up: Return in about 2 weeks (around 01/21/2021) for LROB, CNM, in person.  No future appointments.  No orders of the defined types were placed in this encounter.  [place] CNM, [facility] 01/07/2021 9:39 AM

## 2021-01-07 NOTE — Patient Instructions (Signed)
Julia Levine, thank you for choosing our office today! We appreciate the opportunity to meet your healthcare needs. You may receive a short survey by mail, e-mail, or through MyChart. If you are happy with your care we would appreciate if you could take just a few minutes to complete the survey questions. We read all of your comments and take your feedback very seriously. Thank you again for choosing our office.  [facility]  [facility] ([address] [place], [place] [address]) Entrance C, located off of [address] Free 24/7 valet parking   CLASSES: Go to [email or web address] to register for classes (childbirth, breastfeeding, waterbirth, infant CPR, daddy bootcamp, etc.)  Call the office ([phone]) or go to [facility] if:  You begin to have strong, frequent contractions  Your water breaks.  Sometimes it is a big gush of fluid, sometimes it is just a trickle that keeps getting your panties wet or running down your legs  You have vaginal bleeding.  It is normal to have a small amount of spotting if your cervix was checked.   You don't feel your baby moving like normal.  If you don't, get you something to eat and drink and lay down and focus on feeling your baby move.   If your baby is still not moving like normal, you should call the office or go to [facility].  Call the office ([phone]) or go to [facility] for these signs of pre-eclampsia:  Severe headache that does not go away with Tylenol  Visual changes- seeing spots, double, blurred vision  Pain under your right breast or upper abdomen that does not go away with Tums or heartburn medicine  Nausea and/or vomiting  Severe swelling in your hands, feet, and face   Tdap Vaccine  It is recommended that you get the Tdap vaccine during the third trimester of EACH pregnancy to help protect your baby from getting pertussis (whooping cough)  27-36 weeks  is the BEST time to do this so that you can pass the protection on to your baby. During pregnancy is better than after pregnancy, but if you are unable to get it during pregnancy it will be offered at the hospital.   You can get this vaccine with us, at the health department, your family doctor, or some local pharmacies  Everyone who will be around your baby should also be up-to-date on their vaccines before the baby comes. Adults (who are not pregnant) only need 1 dose of Tdap during adulthood.   [place] Pediatricians/Family Doctors  [facility] ([facility]): [address], [phone]            [facility]: [address], [phone]                 [facility] ([facility]): [address], [phone] (call to ask if accepting patients)  [facility]: [address], [place], [phone]    [place] Pediatricians/Family Doctors  [facility] ([facility]): [address], [phone]  [facility]: [address], [phone]  [facility]: [address], [phone]  [place] [facility]   [facility] ([facility]): [phone]  [facility]: [address], [phone]   [place] Family [facility]  [facility]: [address], [phone]  [place] [facility]  . [place]   [facility]: [address], [phone]  Home Blood Pressure Monitoring for Patients   Your provider has recommended that you check your blood pressure (BP) at least once a week at home. If you do not have a blood pressure cuff at home, one will be provided for you. Contact your provider if you have not received your monitor within 1 week.   Helpful Tips for Accurate Home Blood Pressure Checks  . Don't smoke, exercise, or drink caffeine 30 minutes before checking your BP . Use the restroom  before checking your BP (a full bladder can raise your pressure) . Relax in a comfortable upright chair . Feet on the ground . Left arm resting comfortably on a flat surface at the level of your heart . Legs uncrossed . Back supported . Sit quietly and don't talk . Place the cuff on your bare arm . Adjust snuggly, so that only two fingertips can fit between your skin and the top of the cuff . Check 2 readings separated by at least one minute . Keep a log of your BP readings . For a visual, please reference this diagram: http://ccnc.care/bpdiagram  Provider Name: [facility]     Phone: [phone]  Zone 1: ALL CLEAR  Continue to monitor your symptoms:  . BP reading is less than 140 (top number) or less than 90 (bottom number)  . No right upper stomach pain . No headaches or seeing spots . No feeling nauseated or throwing up . No swelling in face and hands  Zone 2: CAUTION Call your doctor's office for any of the following:  . BP reading is greater than 140 (top number) or greater than 90 (bottom number)  . Stomach pain under your ribs in the middle or right side . Headaches or seeing spots . Feeling nauseated or throwing up . Swelling in face and hands  Zone 3: EMERGENCY  Seek immediate medical care if you have any of the following:  . BP reading is greater than160 (top number) or greater than 110 (bottom number) . Severe headaches not improving with Tylenol . Serious difficulty catching your breath . Any worsening symptoms from Zone 2   Third Trimester of Pregnancy The third trimester is from week 29 through week 42, months 7 through 9. The third trimester is a time when the fetus is growing rapidly. At the end of the ninth month, the fetus is about 20 inches in length and weighs 6-10 pounds.  BODY CHANGES Your body goes through many changes during pregnancy. The changes vary from woman to woman.   Your weight will continue to increase. You can expect to gain 25-35  pounds (11-16 kg) by the end of the pregnancy.  You may begin to get stretch marks on your hips, abdomen, and breasts.  You may urinate more often because the fetus is moving lower into your pelvis and pressing on your bladder.  You may develop or continue to have heartburn as a result of your pregnancy.  You may develop constipation because certain hormones are causing the muscles that push waste through your intestines to slow down.  You may develop hemorrhoids or swollen, bulging veins (varicose veins).  You may have pelvic pain because of the weight gain and pregnancy hormones relaxing your joints between the bones in your pelvis. Backaches may result from overexertion of the muscles supporting your posture.  You may have changes in your hair. These can include thickening of your hair, rapid growth, and changes   in texture. Some women also have hair loss during or after pregnancy, or hair that feels dry or thin. Your hair will most likely return to normal after your baby is born.  Your breasts will continue to grow and be tender. A yellow discharge may leak from your breasts called colostrum.  Your belly button may stick out.  You may feel short of breath because of your expanding uterus.  You may notice the fetus "dropping," or moving lower in your abdomen.  You may have a bloody mucus discharge. This usually occurs a few days to a week before labor begins.  Your cervix becomes thin and soft (effaced) near your due date. WHAT TO EXPECT AT YOUR PRENATAL EXAMS  You will have prenatal exams every 2 weeks until week 36. Then, you will have weekly prenatal exams. During a routine prenatal visit:  You will be weighed to make sure you and the fetus are growing normally.  Your blood pressure is taken.  Your abdomen will be measured to track your baby's growth.  The fetal heartbeat will be listened to.  Any test results from the previous visit will be discussed.  You may have a  cervical check near your due date to see if you have effaced. At around 36 weeks, your caregiver will check your cervix. At the same time, your caregiver will also perform a test on the secretions of the vaginal tissue. This test is to determine if a type of bacteria, Group B streptococcus, is present. Your caregiver will explain this further. Your caregiver may ask you:  What your birth plan is.  How you are feeling.  If you are feeling the baby move.  If you have had any abnormal symptoms, such as leaking fluid, bleeding, severe headaches, or abdominal cramping.  If you have any questions. Other tests or screenings that may be performed during your third trimester include:  Blood tests that check for low iron levels (anemia).  Fetal testing to check the health, activity level, and growth of the fetus. Testing is done if you have certain medical conditions or if there are problems during the pregnancy. FALSE LABOR You may feel small, irregular contractions that eventually go away. These are called Braxton Hicks contractions, or false labor. Contractions may last for hours, days, or even weeks before true labor sets in. If contractions come at regular intervals, intensify, or become painful, it is best to be seen by your caregiver.  SIGNS OF LABOR   Menstrual-like cramps.  Contractions that are 5 minutes apart or less.  Contractions that start on the top of the uterus and spread down to the lower abdomen and back.  A sense of increased pelvic pressure or back pain.  A watery or bloody mucus discharge that comes from the vagina. If you have any of these signs before the 37th week of pregnancy, call your caregiver right away. You need to go to the hospital to get checked immediately. HOME CARE INSTRUCTIONS   Avoid all smoking, herbs, alcohol, and unprescribed drugs. These chemicals affect the formation and growth of the baby.  Follow your caregiver's instructions regarding medicine  use. There are medicines that are either safe or unsafe to take during pregnancy.  Exercise only as directed by your caregiver. Experiencing uterine cramps is a good sign to stop exercising.  Continue to eat regular, healthy meals.  Wear a good support bra for breast tenderness.  Do not use hot tubs, steam rooms, or saunas.  Wear your   seat belt at all times when driving.  Avoid raw meat, uncooked cheese, cat litter boxes, and soil used by cats. These carry germs that can cause birth defects in the baby.  Take your prenatal vitamins.  Try taking a stool softener (if your caregiver approves) if you develop constipation. Eat more high-fiber foods, such as fresh vegetables or fruit and whole grains. Drink plenty of fluids to keep your urine clear or pale yellow.  Take warm sitz baths to soothe any pain or discomfort caused by hemorrhoids. Use hemorrhoid cream if your caregiver approves.  If you develop varicose veins, wear support hose. Elevate your feet for 15 minutes, 3-4 times a day. Limit salt in your diet.  Avoid heavy lifting, wear low heal shoes, and practice good posture.  Rest a lot with your legs elevated if you have leg cramps or low back pain.  Visit your dentist if you have not gone during your pregnancy. Use a soft toothbrush to brush your teeth and be gentle when you floss.  A sexual relationship may be continued unless your caregiver directs you otherwise.  Do not travel far distances unless it is absolutely necessary and only with the approval of your caregiver.  Take prenatal classes to understand, practice, and ask questions about the labor and delivery.  Make a trial run to the hospital.  Pack your hospital bag.  Prepare the baby's nursery.  Continue to go to all your prenatal visits as directed by your caregiver. SEEK MEDICAL CARE IF:  You are unsure if you are in labor or if your water has broken.  You have dizziness.  You have mild pelvic cramps,  pelvic pressure, or nagging pain in your abdominal area.  You have persistent nausea, vomiting, or diarrhea.  You have a bad smelling vaginal discharge.  You have pain with urination. SEEK IMMEDIATE MEDICAL CARE IF:   You have a fever.  You are leaking fluid from your vagina.  You have spotting or bleeding from your vagina.  You have severe abdominal cramping or pain.  You have rapid weight loss or gain.  You have shortness of breath with chest pain.  You notice sudden or extreme swelling of your face, hands, ankles, feet, or legs.  You have not felt your baby move in over an hour.  You have severe headaches that do not go away with medicine.  You have vision changes. Document Released: 07/14/2001 Document Revised: 07/25/2013 Document Reviewed: 09/20/2012 ExitCare Patient Information 2015 [employer]. This information is not intended to replace advice given to you by your health care provider. Make sure you discuss any questions you have with your health care provider.        

## 2021-01-22 ENCOUNTER — Other Ambulatory Visit: Payer: Self-pay

## 2021-01-22 ENCOUNTER — Encounter: Payer: Self-pay | Admitting: Women's Health

## 2021-01-22 ENCOUNTER — Ambulatory Visit (INDEPENDENT_AMBULATORY_CARE_PROVIDER_SITE_OTHER): Payer: Managed Care, Other (non HMO) | Admitting: Women's Health

## 2021-01-22 VITALS — BP 126/65 | HR 107 | Wt 224.5 lb

## 2021-01-22 DIAGNOSIS — Z3A34 34 weeks gestation of pregnancy: Secondary | ICD-10-CM

## 2021-01-22 DIAGNOSIS — Z3483 Encounter for supervision of other normal pregnancy, third trimester: Secondary | ICD-10-CM

## 2021-01-22 DIAGNOSIS — Z3480 Encounter for supervision of other normal pregnancy, unspecified trimester: Secondary | ICD-10-CM

## 2021-01-22 NOTE — Progress Notes (Signed)
    LOW-RISK PREGNANCY VISIT Patient name: Julia Levine MRN [id number]  Date of birth: [birth year] Chief Complaint:   Routine Prenatal Visit  History of Present Illness:   Julia Levine is a 35 y.o. [id number] female at [redacted]w[redacted]d with an Estimated Date of Delivery: 03/05/21 being seen today for ongoing management of a low-risk pregnancy.   Today she reports no complaints. Contractions: Irregular. Vag. Bleeding: None.  Movement: Present. denies leaking of fluid.  Depression screen [facility] 2/9 12/18/2020 09/04/2020 07/23/2020  Decreased Interest 0 0 0  Down, Depressed, Hopeless 0 0 0  PHQ - 2 Score 0 0 0  Altered sleeping 2 0 0  Tired, decreased energy 1 1 1   Change in appetite 0 1 1  Feeling bad or failure about yourself  0 0 0  Trouble concentrating 0 0 0  Moving slowly or fidgety/restless 0 0 0  Suicidal thoughts 0 0 0  PHQ-9 Score 3 2 2      GAD 7 : Generalized Anxiety Score 12/18/2020 09/04/2020 07/23/2020  Nervous, Anxious, on Edge 1 0 0  Control/stop worrying 0 0 0  Worry too much - different things 0 0 0  Trouble relaxing 0 0 0  Restless 0 0 0  Easily annoyed or irritable 1 1 0  Afraid - awful might happen 0 0 0  Total GAD 7 Score 2 1 0      Review of Systems:   Pertinent items are noted in HPI Denies abnormal vaginal discharge w/ itching/odor/irritation, headaches, visual changes, shortness of breath, chest pain, abdominal pain, severe nausea/vomiting, or problems with urination or bowel movements unless otherwise stated above. Pertinent History Reviewed:  Reviewed past medical,surgical, social, obstetrical and family history.  Reviewed problem list, medications and allergies. Physical Assessment:   Vitals:   01/22/21 1618  BP: 126/65  Pulse: (!) 107  Weight: 224 lb 8 oz (101.8 kg)  Body mass index is 35.16 kg/m.        Physical Examination:   General appearance: Well appearing, and in no distress  Mental status: Alert, oriented to person, place, and  time  Skin: Warm & dry  Cardiovascular: Normal heart rate noted  Respiratory: Normal respiratory effort, no distress  Abdomen: Soft, gravid, nontender  Pelvic: Cervical exam deferred         Extremities: Edema: Trace  Fetal Status: Fetal Heart Rate (bpm): 138 Fundal Height: 35 cm Movement: Present    Chaperone: N/A   No results found for this or any previous visit (from the past 24 hour(s)).  Assessment & Plan:  1) Low-risk pregnancy 07/25/2020 at 103w0d with an Estimated Date of Delivery: 03/05/21     Meds: No orders of the defined types were placed in this encounter.  Labs/procedures today: none  Plan:  Continue routine obstetrical care  Next visit: prefers will be in person for cultures     Reviewed: Preterm labor symptoms and general obstetric precautions including but not limited to vaginal bleeding, contractions, leaking of fluid and fetal movement were reviewed in detail with the patient.  All questions were answered. Does have home bp cuff. Office bp cuff given: not applicable. Check bp weekly, let [redacted]w[redacted]d know if consistently >140 and/or >90.  Follow-up: Return in about 2 weeks (around 02/05/2021) for LROB, CNM, in person.  No future appointments.  No orders of the defined types were placed in this encounter.  [place] CNM, [facility] 01/22/2021 4:37 PM

## 2021-01-22 NOTE — Patient Instructions (Signed)
Des[place], thank you for choosing our office today! We appreciate the opportunity to meet your healthcare needs. You may receive a short survey by mail, e-mail, or through MyChart. If you are happy with your care we would appreciate if you could take just a few minutes to complete the survey questions. We read all of your comments and take your feedback very seriously. Thank you again for choosing our office.  °[facility] ° °[facility] [place] °([address] [place], [place] [address]) °Entrance C, located off of [address] °Free 24/7 valet parking  ° °CLASSES: Go to [email or web address] to register for classes (childbirth, breastfeeding, waterbirth, infant CPR, daddy bootcamp, etc.) ° °Call the office ([phone]) or go to [facility] if: °You begin to have strong, frequent contractions °Your water breaks.  Sometimes it is a big gush of fluid, sometimes it is just a trickle that keeps getting your panties wet or running down your legs °You have vaginal bleeding.  It is normal to have a small amount of spotting if your cervix was checked.  °You don't feel your baby moving like normal.  If you don't, get you something to eat and drink and lay down and focus on feeling your baby move.   If your baby is still not moving like normal, you should call the office or go to [facility]. ° °Call the office ([phone]) or go to [facility] for these signs of pre-eclampsia: °Severe headache that does not go away with Tylenol °Visual changes- seeing spots, double, blurred vision °Pain under your right breast or upper abdomen that does not go away with Tums or heartburn medicine °Nausea and/or vomiting °Severe swelling in your hands, feet, and face  ° °Tdap Vaccine °It is recommended that you get the Tdap vaccine during the third trimester of EACH pregnancy to help protect your baby from getting pertussis (whooping cough) °27-36 weeks is the BEST time to do  this so that you can pass the protection on to your baby. During pregnancy is better than after pregnancy, but if you are unable to get it during pregnancy it will be offered at the hospital.  °You can get this vaccine with us, at the health department, your family doctor, or some local pharmacies °Everyone who will be around your baby should also be up-to-date on their vaccines before the baby comes. Adults (who are not pregnant) only need 1 dose of Tdap during adulthood.  ° °[place] [facility]ians/Family Doctors °[facility] ([facility]): [address], [phone]           °[facility]: [address], [phone]                °[facility] ([facility]): [address], [phone] (call to ask if accepting patients) °[facility]: [address], [place], [phone]   ° °[place] Pediatricians/Family Doctors °[facility] ([facility]): [address], [phone] °[facility]: [address], [phone] °[facility]: [address], [phone] ° °[place] [facility]  °[facility] ([facility]): [phone] °[facility]: [address], [phone]  ° °[place] [facility] °[facility]: [address], [phone] ° °[place] [facility]  °[facility]: [address], [phone] ° °Home Blood Pressure Monitoring for Patients  ° °Your provider has recommended that you check your   blood pressure (BP) at least once a week at home. If you do not have a blood pressure cuff at home, one will be provided for you. Contact your provider if you have not received your monitor within 1 week.   Helpful Tips for Accurate Home Blood Pressure Checks  Don't smoke, exercise, or drink caffeine 30 minutes before checking your BP Use the restroom before checking your BP (a full bladder can raise your  pressure) Relax in a comfortable upright chair Feet on the ground Left arm resting comfortably on a flat surface at the level of your heart Legs uncrossed Back supported Sit quietly and don't talk Place the cuff on your bare arm Adjust snuggly, so that only two fingertips can fit between your skin and the top of the cuff Check 2 readings separated by at least one minute Keep a log of your BP readings For a visual, please reference this diagram: http://ccnc.care/bpdiagram  Provider Name: [facility]     Phone: [phone]  Zone 1: ALL CLEAR  Continue to monitor your symptoms:  BP reading is less than 140 (top number) or less than 90 (bottom number)  No right upper stomach pain No headaches or seeing spots No feeling nauseated or throwing up No swelling in face and hands  Zone 2: CAUTION Call your doctor's office for any of the following:  BP reading is greater than 140 (top number) or greater than 90 (bottom number)  Stomach pain under your ribs in the middle or right side Headaches or seeing spots Feeling nauseated or throwing up Swelling in face and hands  Zone 3: EMERGENCY  Seek immediate medical care if you have any of the following:  BP reading is greater than160 (top number) or greater than 110 (bottom number) Severe headaches not improving with Tylenol Serious difficulty catching your breath Any worsening symptoms from Zone 2  Preterm Labor and Birth Information  The normal length of a pregnancy is 39-41 weeks. Preterm labor is when labor starts before 37 completed weeks of pregnancy. What are the risk factors for preterm labor? Preterm labor is more likely to occur in women who: Have certain infections during pregnancy such as a bladder infection, sexually transmitted infection, or infection inside the uterus (chorioamnionitis). Have a shorter-than-normal cervix. Have gone into preterm labor before. Have had surgery on their cervix. Are younger than age 17  or older than age 35. Are African American. Are pregnant with twins or multiple babies (multiple gestation). Take street drugs or smoke while pregnant. Do not gain enough weight while pregnant. Became pregnant shortly after having been pregnant. What are the symptoms of preterm labor? Symptoms of preterm labor include: Cramps similar to those that can happen during a menstrual period. The cramps may happen with diarrhea. Pain in the abdomen or lower back. Regular uterine contractions that may feel like tightening of the abdomen. A feeling of increased pressure in the pelvis. Increased watery or bloody mucus discharge from the vagina. Water breaking (ruptured amniotic sac). Why is it important to recognize signs of preterm labor? It is important to recognize signs of preterm labor because babies who are born prematurely may not be fully developed. This can put them at an increased risk for: Long-term (chronic) heart and lung problems. Difficulty immediately after birth with regulating body systems, including blood sugar, body temperature, heart rate, and breathing rate. Bleeding in the brain. Cerebral palsy. Learning difficulties. Death. These risks are highest for babies who are born before 34 weeks   of pregnancy. How is preterm labor treated? Treatment depends on the length of your pregnancy, your condition, and the health of your baby. It may involve: Having a stitch (suture) placed in your cervix to prevent your cervix from opening too early (cerclage). Taking or being given medicines, such as: Hormone medicines. These may be given early in pregnancy to help support the pregnancy. Medicine to stop contractions. Medicines to help mature the baby's lungs. These may be prescribed if the risk of delivery is high. Medicines to prevent your baby from developing cerebral palsy. If the labor happens before 34 weeks of pregnancy, you may need to stay in the hospital. What should I do if I  think I am in preterm labor? If you think that you are going into preterm labor, call your health care provider right away. How can I prevent preterm labor in future pregnancies? To increase your chance of having a full-term pregnancy: Do not use any tobacco products, such as cigarettes, chewing tobacco, and e-cigarettes. If you need help quitting, ask your health care provider. Do not use street drugs or medicines that have not been prescribed to you during your pregnancy. Talk with your health care provider before taking any herbal supplements, even if you have been taking them regularly. Make sure you gain a healthy amount of weight during your pregnancy. Watch for infection. If you think that you might have an infection, get it checked right away. Make sure to tell your health care provider if you have gone into preterm labor before. This information is not intended to replace advice given to you by your health care provider. Make sure you discuss any questions you have with your health care provider. Document Revised: 11/11/2018 Document Reviewed: 12/11/2015 Elsevier Patient Education  2020 Elsevier Inc.   

## 2021-02-04 DIAGNOSIS — Z029 Encounter for administrative examinations, unspecified: Secondary | ICD-10-CM

## 2021-02-05 ENCOUNTER — Other Ambulatory Visit (HOSPITAL_COMMUNITY)
Admission: RE | Admit: 2021-02-05 | Discharge: 2021-02-05 | Disposition: A | Discharge status: Home / Self Care | Payer: Managed Care, Other (non HMO) | Source: Ambulatory Visit | Attending: Obstetrics & Gynecology | Admitting: Obstetrics & Gynecology

## 2021-02-05 ENCOUNTER — Other Ambulatory Visit: Payer: Self-pay

## 2021-02-05 ENCOUNTER — Encounter: Payer: Self-pay | Admitting: Women's Health

## 2021-02-05 ENCOUNTER — Ambulatory Visit (INDEPENDENT_AMBULATORY_CARE_PROVIDER_SITE_OTHER): Payer: Managed Care, Other (non HMO) | Admitting: Women's Health

## 2021-02-05 VITALS — BP 132/68 | HR 101 | Wt 224.6 lb

## 2021-02-05 DIAGNOSIS — Z3A36 36 weeks gestation of pregnancy: Secondary | ICD-10-CM | POA: Diagnosis present

## 2021-02-05 DIAGNOSIS — Z3480 Encounter for supervision of other normal pregnancy, unspecified trimester: Secondary | ICD-10-CM | POA: Diagnosis not present

## 2021-02-05 DIAGNOSIS — Z3483 Encounter for supervision of other normal pregnancy, third trimester: Secondary | ICD-10-CM

## 2021-02-05 NOTE — Progress Notes (Signed)
LOW-RISK PREGNANCY VISIT Patient name: Julia Levine MRN [id number]  Date of birth: 1986-07-22 Chief Complaint:   Routine Prenatal Visit  History of Present Illness:   Julia Levine is a 35 y.o. [id number] female at [redacted]w[redacted]d with an Estimated Date of Delivery: 03/05/21 being seen today for ongoing management of a low-risk pregnancy.   Today she reports no complaints. Contractions: Irregular. Vag. Bleeding: None.  Movement: Present. denies leaking of fluid.  Depression screen [facility] 2/9 02/05/2021 12/18/2020 09/04/2020 07/23/2020  Decreased Interest 0 0 0 0  Down, Depressed, Hopeless 0 0 0 0  PHQ - 2 Score 0 0 0 0  Altered sleeping 1 2 0 0  Tired, decreased energy 1 1 1 1   Change in appetite 0 0 1 1  Feeling bad or failure about yourself  0 0 0 0  Trouble concentrating 0 0 0 0  Moving slowly or fidgety/restless 0 0 0 0  Suicidal thoughts 0 0 0 0  PHQ-9 Score 2 3 2 2      GAD 7 : Generalized Anxiety Score 02/05/2021 12/18/2020 09/04/2020 07/23/2020  Nervous, Anxious, on Edge 1 1 0 0  Control/stop worrying 0 0 0 0  Worry too much - different things 0 0 0 0  Trouble relaxing 0 0 0 0  Restless 1 0 0 0  Easily annoyed or irritable 0 1 1 0  Afraid - awful might happen 0 0 0 0  Total GAD 7 Score 2 2 1  0      Review of Systems:   Pertinent items are noted in HPI Denies abnormal vaginal discharge w/ itching/odor/irritation, headaches, visual changes, shortness of breath, chest pain, abdominal pain, severe nausea/vomiting, or problems with urination or bowel movements unless otherwise stated above. Pertinent History Reviewed:  Reviewed past medical,surgical, social, obstetrical and family history.  Reviewed problem list, medications and allergies. Physical Assessment:   Vitals:   02/05/21 1633  BP: 132/68  Pulse: (!) 101  Weight: 224 lb 9.6 oz (101.9 kg)  Body mass index is 35.18 kg/m.        Physical Examination:   General appearance: Well appearing, and in no  distress  Mental status: Alert, oriented to person, place, and time  Skin: Warm & dry  Cardiovascular: Normal heart rate noted  Respiratory: Normal respiratory effort, no distress  Abdomen: Soft, gravid, nontender  Pelvic: Cervical exam performed , firm bump @ 7 o'clock on cervix, ? Nabothian cyst vs scar tissue from cryo 2017 Dilation: 3 Effacement (%): 80 Station: -2  Extremities: Edema: Trace  Fetal Status: Fetal Heart Rate (bpm): 138 Fundal Height: 36 cm Movement: Present Presentation: Vertex  Chaperone: Latisha Cresenzo   No results found for this or any previous visit (from the past 24 hour(s)).  Assessment & Plan:  1) Low-risk pregnancy at [redacted]w[redacted]d with an Estimated Date of Delivery: 03/05/21    Meds: No orders of the defined types were placed in this encounter.  Labs/procedures today: GBS, GC/CT, and SVE  Plan:  Continue routine obstetrical care  Next visit: prefers in person    Reviewed: Preterm labor symptoms and general obstetric precautions including but not limited to vaginal bleeding, contractions, leaking of fluid and fetal movement were reviewed in detail with the patient.  All questions were answered. Does have home bp cuff. Office bp cuff given: not applicable. Check bp weekly, let [id number] know if consistently >140 and/or >90.  Follow-up: Return in about 1 week (around 02/12/2021) for LROB, CNM, in  person.  Future Appointments  Date Time Provider Department Center  02/12/2021  2:10 PM Arabella Merles, CNM [facility]    Orders Placed This Encounter  Procedures   Culture, beta strep (group b only)   Cheral Marker CNM, [facility] 02/05/2021 5:08 PM

## 2021-02-05 NOTE — Patient Instructions (Signed)
Jazira, thank you for choosing our office today! We appreciate the opportunity to meet your healthcare needs. You may receive a short survey by mail, e-mail, or through MyChart. If you are happy with your care we would appreciate if you could take just a few minutes to complete the survey questions. We read all of your comments and take your feedback very seriously. Thank you again for choosing our office.  °[facility] ° °[facility] °([address] [place], [place] [address]) °Entrance C, located off of [address] °Free 24/7 valet parking  ° °CLASSES: Go to [email or web address] to register for classes (childbirth, breastfeeding, waterbirth, infant CPR, daddy bootcamp, etc.) ° °Call the office ([phone]) or go to [facility] if: °You begin to have strong, frequent contractions °Your water breaks.  Sometimes it is a big gush of fluid, sometimes it is just a trickle that keeps getting your panties wet or running down your legs °You have vaginal bleeding.  It is normal to have a small amount of spotting if your cervix was checked.  °You don't feel your baby moving like normal.  If you don't, get you something to eat and drink and lay down and focus on feeling your baby move.   If your baby is still not moving like normal, you should call the office or go to [facility]. ° °Call the office ([phone]) or go to [facility] for these signs of pre-eclampsia: °Severe headache that does not go away with Tylenol °Visual changes- seeing spots, double, blurred vision °Pain under your right breast or upper abdomen that does not go away with Tums or heartburn medicine °Nausea and/or vomiting °Severe swelling in your hands, feet, and face  ° °Tdap Vaccine °It is recommended that you get the Tdap vaccine during the third trimester of EACH pregnancy to help protect your baby from getting pertussis (whooping cough) °27-36 weeks is the BEST time to do  this so that you can pass the protection on to your baby. During pregnancy is better than after pregnancy, but if you are unable to get it during pregnancy it will be offered at the hospital.  °You can get this vaccine with us, at the health department, your family doctor, or some local pharmacies °Everyone who will be around your baby should also be up-to-date on their vaccines before the baby comes. Adults (who are not pregnant) only need 1 dose of Tdap during adulthood.  ° °[place] Pediatricians/Family Doctors °[facility] ([facility]): [address], [phone]           °[facility]: [address], [phone]                °[facility] ([facility]): [address], [phone] (call to ask if accepting patients) °[facility]: [address], [place], [phone]   ° °[place] Pediatricians/Family Doctors °[facility] ([facility]): [address], [phone] °[facility]: [address], [phone] °[facility]: [address], [phone] ° °[place] [facility]  °[facility] ([facility]): [phone] °[facility]: [address], [phone]  ° °[place] [facility] °[facility]: [address], [phone] ° °[place] [facility]  °[facility]: [address], [phone] ° °Home Blood Pressure Monitoring for Patients  ° °Your provider has recommended that you check your   blood pressure (BP) at least once a week at home. If you do not have a blood pressure cuff at home, one will be provided for you. Contact your provider if you have not received your monitor within 1 week.   Helpful Tips for Accurate Home Blood Pressure Checks  Don't smoke, exercise, or drink caffeine 30 minutes before checking your BP Use the restroom before checking your BP (a full bladder can raise your  pressure) Relax in a comfortable upright chair Feet on the ground Left arm resting comfortably on a flat surface at the level of your heart Legs uncrossed Back supported Sit quietly and don't talk Place the cuff on your bare arm Adjust snuggly, so that only two fingertips can fit between your skin and the top of the cuff Check 2 readings separated by at least one minute Keep a log of your BP readings For a visual, please reference this diagram: http://ccnc.care/bpdiagram  Provider Name: [facility]     Phone: [phone]  Zone 1: ALL CLEAR  Continue to monitor your symptoms:  BP reading is less than 140 (top number) or less than 90 (bottom number)  No right upper stomach pain No headaches or seeing spots No feeling nauseated or throwing up No swelling in face and hands  Zone 2: CAUTION Call your doctor's office for any of the following:  BP reading is greater than 140 (top number) or greater than 90 (bottom number)  Stomach pain under your ribs in the middle or right side Headaches or seeing spots Feeling nauseated or throwing up Swelling in face and hands  Zone 3: EMERGENCY  Seek immediate medical care if you have any of the following:  BP reading is greater than160 (top number) or greater than 110 (bottom number) Severe headaches not improving with Tylenol Serious difficulty catching your breath Any worsening symptoms from Zone 2  Preterm Labor and Birth Information  The normal length of a pregnancy is 39-41 weeks. Preterm labor is when labor starts before 37 completed weeks of pregnancy. What are the risk factors for preterm labor? Preterm labor is more likely to occur in women who: Have certain infections during pregnancy such as a bladder infection, sexually transmitted infection, or infection inside the uterus (chorioamnionitis). Have a shorter-than-normal cervix. Have gone into preterm labor before. Have had surgery on their cervix. Are younger than age 17  or older than age 35. Are African American. Are pregnant with twins or multiple babies (multiple gestation). Take street drugs or smoke while pregnant. Do not gain enough weight while pregnant. Became pregnant shortly after having been pregnant. What are the symptoms of preterm labor? Symptoms of preterm labor include: Cramps similar to those that can happen during a menstrual period. The cramps may happen with diarrhea. Pain in the abdomen or lower back. Regular uterine contractions that may feel like tightening of the abdomen. A feeling of increased pressure in the pelvis. Increased watery or bloody mucus discharge from the vagina. Water breaking (ruptured amniotic sac). Why is it important to recognize signs of preterm labor? It is important to recognize signs of preterm labor because babies who are born prematurely may not be fully developed. This can put them at an increased risk for: Long-term (chronic) heart and lung problems. Difficulty immediately after birth with regulating body systems, including blood sugar, body temperature, heart rate, and breathing rate. Bleeding in the brain. Cerebral palsy. Learning difficulties. Death. These risks are highest for babies who are born before 34 weeks   of pregnancy. How is preterm labor treated? Treatment depends on the length of your pregnancy, your condition, and the health of your baby. It may involve: Having a stitch (suture) placed in your cervix to prevent your cervix from opening too early (cerclage). Taking or being given medicines, such as: Hormone medicines. These may be given early in pregnancy to help support the pregnancy. Medicine to stop contractions. Medicines to help mature the baby's lungs. These may be prescribed if the risk of delivery is high. Medicines to prevent your baby from developing cerebral palsy. If the labor happens before 34 weeks of pregnancy, you may need to stay in the hospital. What should I do if I  think I am in preterm labor? If you think that you are going into preterm labor, call your health care provider right away. How can I prevent preterm labor in future pregnancies? To increase your chance of having a full-term pregnancy: Do not use any tobacco products, such as cigarettes, chewing tobacco, and e-cigarettes. If you need help quitting, ask your health care provider. Do not use street drugs or medicines that have not been prescribed to you during your pregnancy. Talk with your health care provider before taking any herbal supplements, even if you have been taking them regularly. Make sure you gain a healthy amount of weight during your pregnancy. Watch for infection. If you think that you might have an infection, get it checked right away. Make sure to tell your health care provider if you have gone into preterm labor before. This information is not intended to replace advice given to you by your health care provider. Make sure you discuss any questions you have with your health care provider. Document Revised: 11/11/2018 Document Reviewed: 12/11/2015 Elsevier Patient Education  2020 Elsevier Inc.   

## 2021-02-06 ENCOUNTER — Encounter: Payer: Self-pay | Admitting: *Deleted

## 2021-02-07 LAB — CERVICOVAGINAL ANCILLARY ONLY
Chlamydia: NEGATIVE
Comment: NEGATIVE
Comment: NORMAL
Neisseria Gonorrhea: NEGATIVE

## 2021-02-11 LAB — CULTURE, BETA STREP (GROUP B ONLY): Strep Gp B Culture: NEGATIVE

## 2021-02-12 ENCOUNTER — Other Ambulatory Visit: Payer: Self-pay

## 2021-02-12 ENCOUNTER — Ambulatory Visit (INDEPENDENT_AMBULATORY_CARE_PROVIDER_SITE_OTHER): Payer: Managed Care, Other (non HMO) | Admitting: Advanced Practice Midwife

## 2021-02-12 VITALS — BP 124/66 | HR 88 | Wt 224.0 lb

## 2021-02-12 DIAGNOSIS — Z3480 Encounter for supervision of other normal pregnancy, unspecified trimester: Secondary | ICD-10-CM

## 2021-02-12 DIAGNOSIS — Z3A37 37 weeks gestation of pregnancy: Secondary | ICD-10-CM

## 2021-02-12 NOTE — Progress Notes (Signed)
   LOW-RISK PREGNANCY VISIT Patient name: Julia Levine MRN [id number]  Date of birth: [birth year] Chief Complaint:   Routine Prenatal Visit  History of Present Illness:   Isadora Delorey is a 35 y.o. [id number] female at 103w0d with an Estimated Date of Delivery: 03/05/21 being seen today for ongoing management of a low-risk pregnancy.  Today she reports  reg ctx yesterday q that fizzled out after 4h . Contractions: Irregular. Vag. Bleeding: None.  Movement: Present. denies leaking of fluid. Review of Systems:   Pertinent items are noted in HPI Denies abnormal vaginal discharge w/ itching/odor/irritation, headaches, visual changes, shortness of breath, chest pain, abdominal pain, severe nausea/vomiting, or problems with urination or bowel movements unless otherwise stated above. Pertinent History Reviewed:  Reviewed past medical,surgical, social, obstetrical and family history.  Reviewed problem list, medications and allergies. Physical Assessment:   Vitals:   02/12/21 1414  BP: 124/66  Pulse: 88  Weight: 224 lb (101.6 kg)  Body mass index is 35.08 kg/m.        Physical Examination:   General appearance: Well appearing, and in no distress  Mental status: Alert, oriented to person, place, and time  Skin: Warm & dry  Cardiovascular: Normal heart rate noted  Respiratory: Normal respiratory effort, no distress  Abdomen: Soft, gravid, nontender  Pelvic: Cervical exam performed  Dilation: 3 Effacement (%): 70 Station: -2 (firm bump ~ 7 o'clock on cx)  Extremities: Edema: Trace  Fetal Status: Fetal Heart Rate (bpm): 152 Fundal Height: 37 cm Movement: Present Presentation: Vertex  No results found for this or any previous visit (from the past 24 hour(s)).  Assessment & Plan:  1) Low-risk pregnancy [id number] at [redacted]w[redacted]d with an Estimated Date of Delivery: 03/05/21     Meds: No orders of the defined types were placed in this encounter.  Labs/procedures today: SVE  Plan:   Continue routine obstetrical care   Reviewed: Term labor symptoms and general obstetric precautions including but not limited to vaginal bleeding, contractions, leaking of fluid and fetal movement were reviewed in detail with the patient.  All questions were answered. Has home bp cuff.  Check bp weekly, let us know if >140/90.   Follow-up: Return in about 1 week (around 02/19/2021) for LROB, in person.  No orders of the defined types were placed in this encounter.  Arabella Merles CNM 02/12/2021 2:46 PM

## 2021-02-20 ENCOUNTER — Other Ambulatory Visit: Payer: Self-pay

## 2021-02-20 ENCOUNTER — Encounter: Payer: Self-pay | Admitting: Obstetrics & Gynecology

## 2021-02-20 ENCOUNTER — Ambulatory Visit (INDEPENDENT_AMBULATORY_CARE_PROVIDER_SITE_OTHER): Payer: Managed Care, Other (non HMO) | Admitting: Obstetrics & Gynecology

## 2021-02-20 VITALS — BP 128/69 | HR 106 | Wt 228.0 lb

## 2021-02-20 DIAGNOSIS — Z3480 Encounter for supervision of other normal pregnancy, unspecified trimester: Secondary | ICD-10-CM

## 2021-02-20 DIAGNOSIS — Z3A38 38 weeks gestation of pregnancy: Secondary | ICD-10-CM

## 2021-02-20 NOTE — Progress Notes (Signed)
   LOW-RISK PREGNANCY VISIT Patient name: Julia Levine MRN [id number]  Date of birth: 08-31-1985 Chief Complaint:   Routine Prenatal Visit  History of Present Illness:   Julia Levine is a 35 y.o. [id number] female at [redacted]w[redacted]d with an Estimated Date of Delivery: 03/05/21 being seen today for ongoing management of a low-risk pregnancy.  Depression screen [facility] 2/9 02/05/2021 12/18/2020 09/04/2020 07/23/2020  Decreased Interest 0 0 0 0  Down, Depressed, Hopeless 0 0 0 0  PHQ - 2 Score 0 0 0 0  Altered sleeping 1 2 0 0  Tired, decreased energy 1 1 1 1   Change in appetite 0 0 1 1  Feeling bad or failure about yourself  0 0 0 0  Trouble concentrating 0 0 0 0  Moving slowly or fidgety/restless 0 0 0 0  Suicidal thoughts 0 0 0 0  PHQ-9 Score 2 3 2 2     Today she reports no complaints. Contractions: Irritability. Vag. Bleeding: None.  Movement: Present. denies leaking of fluid. Review of Systems:   Pertinent items are noted in HPI Denies abnormal vaginal discharge w/ itching/odor/irritation, headaches, visual changes, shortness of breath, chest pain, abdominal pain, severe nausea/vomiting, or problems with urination or bowel movements unless otherwise stated above. Pertinent History Reviewed:  Reviewed past medical,surgical, social, obstetrical and family history.  Reviewed problem list, medications and allergies. Physical Assessment:   Vitals:   02/20/21 1520  BP: 128/69  Pulse: (!) 106  Weight: 228 lb (103.4 kg)  Body mass index is 35.71 kg/m.        Physical Examination:   General appearance: Well appearing, and in no distress  Mental status: Alert, oriented to person, place, and time  Skin: Warm & dry  Cardiovascular: Normal heart rate noted  Respiratory: Normal respiratory effort, no distress  Abdomen: Soft, gravid, nontender  Pelvic: Cervical exam performed         Extremities: Edema: Trace  Fetal Status:     Movement: Present    Chaperone: Amanda Rash    No  results found for this or any previous visit (from the past 24 hour(s)).  Assessment & Plan:  1) Low-risk pregnancy at [redacted]w[redacted]d with an Estimated Date of Delivery: 03/05/21      Meds: No orders of the defined types were placed in this encounter.  Labs/procedures today:   Plan:  Continue routine obstetrical care  Next visit: prefers in person    Reviewed: Term labor symptoms and general obstetric precautions including but not limited to vaginal bleeding, contractions, leaking of fluid and fetal movement were reviewed in detail with the patient.  All questions were answered. Has home bp cuff. Rx faxed to  Check bp weekly, let [redacted]w[redacted]d know if >140/90.   Follow-up: Return in about 6 days (around 02/26/2021) for LROB.  No orders of the defined types were placed in this encounter.   [place], MD 02/20/2021 4:18 PM

## 2021-02-23 ENCOUNTER — Other Ambulatory Visit: Payer: Self-pay

## 2021-02-23 ENCOUNTER — Encounter (HOSPITAL_COMMUNITY): Payer: Self-pay | Admitting: Obstetrics & Gynecology

## 2021-02-23 ENCOUNTER — Inpatient Hospital Stay (HOSPITAL_COMMUNITY)
Admission: AD | Admit: 2021-02-23 | Discharge: 2021-02-24 | DRG: 807 | Disposition: A | Discharge status: Home / Self Care | Payer: PRIVATE HEALTH INSURANCE | Attending: Obstetrics & Gynecology | Admitting: Obstetrics & Gynecology

## 2021-02-23 DIAGNOSIS — Z3A38 38 weeks gestation of pregnancy: Secondary | ICD-10-CM

## 2021-02-23 DIAGNOSIS — Z20822 Contact with and (suspected) exposure to covid-19: Secondary | ICD-10-CM | POA: Diagnosis present

## 2021-02-23 DIAGNOSIS — O26893 Other specified pregnancy related conditions, third trimester: Secondary | ICD-10-CM | POA: Diagnosis present

## 2021-02-23 DIAGNOSIS — O4202 Full-term premature rupture of membranes, onset of labor within 24 hours of rupture: Secondary | ICD-10-CM

## 2021-02-23 LAB — CBC
HCT: 35.5 % — ABNORMAL LOW (ref 36.0–46.0)
Hemoglobin: 10.8 g/dL — ABNORMAL LOW (ref 12.0–15.0)
MCH: 29.4 pg (ref 26.0–34.0)
MCHC: 30.4 g/dL (ref 30.0–36.0)
MCV: 96.7 fL (ref 80.0–100.0)
Platelets: 176 10*3/uL (ref 150–400)
RBC: 3.67 MIL/uL — ABNORMAL LOW (ref 3.87–5.11)
RDW: 14 % (ref 11.5–15.5)
WBC: 9 10*3/uL (ref 4.0–10.5)
nRBC: 0 % (ref 0.0–0.2)

## 2021-02-23 LAB — RESP PANEL BY RT-PCR (FLU A&B, COVID) ARPGX2
Influenza A by PCR: NEGATIVE
Influenza B by PCR: NEGATIVE
SARS Coronavirus 2 by RT PCR: NEGATIVE

## 2021-02-23 LAB — TYPE AND SCREEN
ABO/RH(D): A POS
Antibody Screen: NEGATIVE

## 2021-02-23 LAB — RPR: RPR Ser Ql: NONREACTIVE

## 2021-02-23 MED ORDER — LIDOCAINE HCL (PF) 1 % IJ SOLN
30.0000 mL | INTRAMUSCULAR | Status: DC | PRN
Start: 2021-02-23 — End: 2021-02-23

## 2021-02-23 MED ORDER — ONDANSETRON HCL 4 MG PO TABS
4.0000 mg | ORAL_TABLET | ORAL | Status: DC | PRN
Start: 2021-02-23 — End: 2021-02-24

## 2021-02-23 MED ORDER — LACTATED RINGERS IV SOLN: 500.0000 mL | INTRAVENOUS | Status: DC | PRN

## 2021-02-23 MED ORDER — SENNOSIDES-DOCUSATE SODIUM 8.6-50 MG PO TABS
2.0000 | ORAL_TABLET | Freq: Every day | ORAL | Status: DC
  Administered 2021-02-24: 2 via ORAL
  Filled 2021-02-23: qty 2

## 2021-02-23 MED ORDER — OXYTOCIN-SODIUM CHLORIDE 30-0.9 UT/500ML-% IV SOLN
2.5000 [IU]/h | INTRAVENOUS | Status: DC
Start: 2021-02-23 — End: 2021-02-23
  Filled 2021-02-23: qty 500

## 2021-02-23 MED ORDER — ACETAMINOPHEN 325 MG PO TABS
650.0000 mg | ORAL_TABLET | ORAL | Status: DC | PRN
Start: 2021-02-23 — End: 2021-02-24

## 2021-02-23 MED ORDER — OXYCODONE-ACETAMINOPHEN 5-325 MG PO TABS: 1.0000 | ORAL_TABLET | ORAL | Status: DC | PRN

## 2021-02-23 MED ORDER — TETANUS-DIPHTH-ACELL PERTUSSIS 5-2.5-18.5 LF-MCG/0.5 IM SUSY: 0.5000 mL | PREFILLED_SYRINGE | Freq: Once | INTRAMUSCULAR | Status: DC

## 2021-02-23 MED ORDER — PRENATAL MULTIVITAMIN CH
1.0000 | ORAL_TABLET | Freq: Every day | ORAL | Status: DC
  Administered 2021-02-23 – 2021-02-24 (×2): 1 via ORAL
  Filled 2021-02-23 (×2): qty 1

## 2021-02-23 MED ORDER — OXYTOCIN BOLUS FROM INFUSION
333.0000 mL | Freq: Once | INTRAVENOUS | Status: AC
  Administered 2021-02-23: 333 mL via INTRAVENOUS

## 2021-02-23 MED ORDER — ZOLPIDEM TARTRATE 5 MG PO TABS: 5.0000 mg | ORAL_TABLET | Freq: Every evening | ORAL | Status: DC | PRN

## 2021-02-23 MED ORDER — SOD CITRATE-CITRIC ACID 500-334 MG/5ML PO SOLN: 30.0000 mL | ORAL | Status: DC | PRN

## 2021-02-23 MED ORDER — LACTATED RINGERS IV SOLN
INTRAVENOUS | Status: DC
Start: 2021-02-23 — End: 2021-02-23

## 2021-02-23 MED ORDER — WITCH HAZEL-GLYCERIN EX PADS: 1.0000 "application " | MEDICATED_PAD | CUTANEOUS | Status: DC | PRN

## 2021-02-23 MED ORDER — ONDANSETRON HCL 4 MG/2ML IJ SOLN: 4.0000 mg | Freq: Four times a day (QID) | INTRAMUSCULAR | Status: DC | PRN

## 2021-02-23 MED ORDER — OXYCODONE-ACETAMINOPHEN 5-325 MG PO TABS: 2.0000 | ORAL_TABLET | ORAL | Status: DC | PRN

## 2021-02-23 MED ORDER — SIMETHICONE 80 MG PO CHEW: 80.0000 mg | CHEWABLE_TABLET | ORAL | Status: DC | PRN

## 2021-02-23 MED ORDER — ONDANSETRON HCL 4 MG/2ML IJ SOLN: 4.0000 mg | INTRAMUSCULAR | Status: DC | PRN

## 2021-02-23 MED ORDER — ACETAMINOPHEN 325 MG PO TABS: 650.0000 mg | ORAL_TABLET | ORAL | Status: DC | PRN

## 2021-02-23 MED ORDER — DIPHENHYDRAMINE HCL 25 MG PO CAPS: 25.0000 mg | ORAL_CAPSULE | Freq: Four times a day (QID) | ORAL | Status: DC | PRN

## 2021-02-23 MED ORDER — BENZOCAINE-MENTHOL 20-0.5 % EX AERO: 1.0000 "application " | INHALATION_SPRAY | CUTANEOUS | Status: DC | PRN

## 2021-02-23 MED ORDER — IBUPROFEN 600 MG PO TABS
600.0000 mg | ORAL_TABLET | Freq: Four times a day (QID) | ORAL | Status: DC
  Administered 2021-02-23 – 2021-02-24 (×5): 600 mg via ORAL
  Filled 2021-02-23 (×5): qty 1

## 2021-02-23 MED ORDER — DIBUCAINE (PERIANAL) 1 % EX OINT: 1.0000 "application " | TOPICAL_OINTMENT | CUTANEOUS | Status: DC | PRN

## 2021-02-23 MED ORDER — COCONUT OIL OIL
1.0000 | TOPICAL_OIL | Status: DC | PRN
Start: 2021-02-23 — End: 2021-02-24

## 2021-02-23 NOTE — MAU Provider Note (Signed)
Dilation: 8 Effacement (%): 100 Station: -2 Presentation: Vertex Exam by:: Blanche East, NP

## 2021-02-23 NOTE — Discharge Summary (Signed)
Postpartum Discharge Summary     Patient Name: Julia Levine DOB: 27-Feb-1986 MRN: [id number]  Date of admission: 02/23/2021 Delivery date:02/23/2021  Delivering provider: Laury Deep  Date of discharge: 02/24/2021  Admitting diagnosis: Indication for care in labor and delivery, antepartum [O75.9] Intrauterine pregnancy: [redacted]w[redacted]d    Secondary diagnosis:  Active Problems:   Indication for care in labor and delivery, antepartum  Additional problems: none    Discharge diagnosis: Term Pregnancy Delivered                                              Post partum procedures: None  Augmentation: N/A Complications: None  Hospital course: Onset of Labor With Vaginal Delivery      35y.o. yo [id number] 39w4das admitted in Active Labor on 02/23/2021. Patient had an uncomplicated labor course as follows:  Membrane Rupture Time/Date: 3:00 AM ,02/23/2021   Delivery Method:Vaginal, Spontaneous  Episiotomy: None  Lacerations:  None  Patient had an uncomplicated postpartum course.  She is ambulating, tolerating a regular diet, passing flatus, and urinating well. Patient is discharged home in stable condition on 02/24/21.  Newborn Data: Birth date:02/23/2021  Birth time:5:08 AM  Gender:Female  Living status:Living  Apgars:8 ,9  Weight:3880 g   Magnesium Sulfate received: No BMZ received: No Rhophylac:N/A MMR:No T-DaP:Given prenatally 01/07/2021 Flu: No Transfusion:No  Physical exam  Vitals:   02/23/21 1230 02/23/21 1558 02/23/21 2217 02/24/21 0536  BP: (!) 129/53 122/65 (!) 125/59 127/67  Pulse: 88 88 76 75  Resp: _0 Temp: 98.1 F (36.7 C) 98.3 F (36.8 C) 98.3 F (36.8 C) 98.2 F (36.8 C)  TempSrc: Oral Oral Oral Oral  SpO2:  98% 98%    General: alert, cooperative, and no distress Lochia: appropriate Uterine Fundus: firm Incision: N/A DVT Evaluation: No significant calf/ankle edema. Labs: Lab Results  Component Value Date   WBC 9.0 02/23/2021   HGB  10.8 (L) 02/23/2021   HCT 35.5 (L) 02/23/2021   MCV 96.7 02/23/2021   PLT 176 02/23/2021   CMP Latest Ref Rng & Units 04/24/2019  Glucose 65 - 99 mg/dL 80  BUN 6 - 20 mg/dL 9  Creatinine 0.57 - 1.00 mg/dL 0.71  Sodium 134 - 144 mmol/L 141  Potassium 3.5 - 5.2 mmol/L 3.9  Chloride 96 - 106 mmol/L 100  CO2 20 - 29 mmol/L 23  Calcium 8.7 - 10.2 mg/dL 9.4  Total Protein 6.0 - 8.5 g/dL 7.3  Total Bilirubin 0.0 - 1.2 mg/dL 0.5  Alkaline Phos 39 - 117 IU/L 111  AST 0 - 40 IU/L 26  ALT 0 - 32 IU/L 47(H)   EdFlavia Shippercore: Edinburgh Postnatal Depression Scale Screening Tool 02/24/2021  I have been able to laugh and see the funny side of things. 0  I have looked forward with enjoyment to things. 0  I have blamed myself unnecessarily when things went wrong. 1  I have been anxious or worried for no good reason. 2  I have felt scared or panicky for no good reason. 1  Things have been getting on top of me. 1  I have been so unhappy that I have had difficulty sleeping. 0  I have felt sad or miserable. 0  I have been so unhappy that I have been crying. 0  The thought of harming myself  has occurred to me. 0  Edinburgh Postnatal Depression Scale Total 5     After visit meds:  Allergies as of 02/24/2021   No Known Allergies      Medication List     STOP taking these medications    Doxylamine-Pyridoxine 10-10 MG Tbec Commonly known as: Diclegis   progesterone 200 MG capsule Commonly known as: PROMETRIUM       TAKE these medications    acetaminophen 325 MG tablet Commonly known as: Tylenol Take 2 tablets (650 mg total) by mouth every 4 (four) hours as needed (for pain scale < 4).   ibuprofen 600 MG tablet Commonly known as: ADVIL Take 1 tablet (600 mg total) by mouth every 6 (six) hours as needed.   PRENATAL VITAMIN PO Take by mouth.         Discharge home in stable condition Infant Feeding: Bottle Infant Disposition:home with mother Discharge instruction: per  After Visit Summary and Postpartum booklet. Activity: Advance as tolerated. Pelvic rest for 6 weeks.  Diet: routine diet Future Appointments: Future Appointments  Date Time Provider [place]  03/31/2021 11:50 AM Roma Schanz, CNM [facility]   Follow up Visit:  Follow-up Information     [facility]. Schedule an appointment as soon as possible for a visit in 4 week(s).   Specialty: Obstetrics and Gynecology Why: postpartum visit Contact information: [address] [place] [phone]                Message to FT on 02/23/2021 @ 0806 by Laury Deep, CNM Please schedule this patient for a In person postpartum visit in 4 weeks with the following provider: Any provider. Additional Postpartum F/U: none   Low risk pregnancy complicated by:  none Delivery mode:  Vaginal, Spontaneous  Anticipated Birth Control:  BTL done PP   02/24/2021 Patriciaann Clan, DO

## 2021-02-23 NOTE — MAU Note (Signed)
Pt reports gush of fluid around 0330, clear odorless, denies VB and endorses +FM.

## 2021-02-23 NOTE — Progress Notes (Signed)
Patient ID: Julia Levine, female   DOB: 13-Nov-1985, 35 y.o.   MRN: [id number] Attending Circumcision Counseling Progress Note  Patient desires circumcision for her female infant.  Circumcision procedure details discussed, risks and benefits of procedure were also discussed.  These include but are not limited to: Benefits of circumcision in men include reduction in the rates of urinary tract infection (UTI), penile cancer, some sexually transmitted infections, penile inflammatory and retractile disorders, as well as easier hygiene.  Risks include bleeding , infection, injury of glans which may lead to penile deformity or urinary tract issues, unsatisfactory cosmetic appearance and other potential complications related to the procedure.  It was emphasized that this is an elective procedure.  Patient wants to proceed with circumcision; written informed consent obtained.  Will do circumcision soon, routine circumcision and post circumcision care ordered for the infant.  Julia Levine, M.D. 02/23/2021 9:41 AM

## 2021-02-23 NOTE — H&P (Signed)
Julia Levine is a 35 y.o. female [phone] with IUP at [redacted]w[redacted]d by LMP c/w 10 week [place]. She is presenting for SROM at 0300 clear and strong contractions.  She reports positive fetal movement. She denies vaginal bleeding; she denies any complications in this pregnancy.   Prenatal History/Complications: PNC at [facility].  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    Past Surgical History: Past Surgical History:  Procedure Laterality Date   COLPOSCOPY  10/2011   abnormal pap HPV    Obstetrical History: OB History     Gravida  5   Para  3   Term  2   Preterm  1   AB  1   Living  2      SAB  1   IAB  0   Ectopic  0   Multiple  1   Live Births  4            Social History: Social History   Socioeconomic History   Marital status: Married    Spouse name: Not on file   Number of children: Not on file   Years of education: Not on file   Highest education level: Not on file  Occupational History   Not on file  Tobacco Use   Smoking status: Never   Smokeless tobacco: Never  Vaping Use   Vaping Use: Never used  Substance and Sexual Activity   Alcohol use: Not Currently   Drug use: No   Sexual activity: Yes    Partners: Male    Birth control/protection: None  Other Topics Concern   Not on file  Social History Narrative   Not on file   Social Determinants of Health   Financial Resource Strain: Low Risk    Difficulty of Paying Living Expenses: Not hard at all  Food Insecurity: No Food Insecurity   Worried About Programme researcher, broadcasting/film/video in the Last Year: Never true   Ran Out of Food in the Last Year: Never true  Transportation Needs: No Transportation Needs   Lack of Transportation (Medical): No   Lack of Transportation (Non-Medical): No  Physical Activity: Insufficiently Active   Days of Exercise per Week: 1 day   Minutes of Exercise per Session: 20 min   Stress: No Stress Concern Present   Feeling of Stress : Only a little  Social Connections: Moderately Isolated   Frequency of Communication with Friends and Family: Twice a week   Frequency of Social Gatherings with Friends and Family: Once a week   Attends Religious Services: Never   Database administrator or Organizations: No   Attends Engineer, structural: Never   Marital Status: Married    Family History: Family History  Problem Relation Age of Onset   Hypertension Mother    Mitral valve prolapse Mother    Cancer Maternal Grandmother 13       cervical    Allergies: No Known Allergies  Medications Prior to Admission  Medication Sig Dispense Refill Last Dose   Doxylamine-Pyridoxine (DICLEGIS) 10-10 MG TBEC 2 tabs q hs, if sx persist add 1 tab q am on day 3, if sx persist add 1 tab q afternoon on day 4 (Patient not taking: Reported on 02/12/2021) 100 tablet 6    Prenatal Vit-Fe Fumarate-FA (PRENATAL VITAMIN PO) Take by mouth.      progesterone (PROMETRIUM) 200 MG capsule  PLACE 1 CAPSULE (200 MG TOTAL) VAGINALLY AT BEDTIME. 90 capsule 1     Review of Systems   Constitutional: Negative for fever and chills Eyes: Negative for visual disturbances Respiratory: Negative for shortness of breath, dyspnea Cardiovascular: Negative for chest pain or palpitations  Gastrointestinal: Negative for vomiting, diarrhea and constipation.  POSITIVE for abdominal pain (contractions) Genitourinary: Negative for dysuria and urgency Musculoskeletal: Negative for back pain, joint pain, myalgias  Neurological: Negative for dizziness and headaches  Blood pressure (!) 130/44, pulse 94, temperature 98.3 F (36.8 C), temperature source Oral, resp. rate 16, last menstrual period 05/29/2020. General appearance: alert, mild distress Lungs: normal respiratory effort Heart: regular rate and rhythm Abdomen: soft, non-tender; bowel sounds normal Extremities: Homans sign is negative, no sign of  DVT DTR's 2+ Presentation: cephalic Fetal monitoring  130 bpm, mod var, present acel, no decels, irregular contractions Uterine activity  q2-4 Dilation: 10 Effacement (%): 100 Station: -1 Exam by:: Illene Silver, CNM   Prenatal labs: ABO, Rh: A/Positive/-- (02/03 0807) Antibody: Negative (05/18 0838) Rubella: 1.38 (02/03 0807) RPR: Non Reactive (05/18 0838)  HBsAg: Negative (02/03 0807)  HIV: Non Reactive (05/18 0838)  GBS: Negative/-- (07/07 1200)  1 hr Glucola passed Genetic screening  normal Anatomy US normal  Prenatal Transfer Tool  Maternal Diabetes: No Genetic Screening: Normal Maternal Ultrasounds/Referrals: Normal Fetal Ultrasounds or other Referrals:  None Maternal Substance Abuse:  No Significant Maternal Medications:  None Significant Maternal Lab Results: None  No results found for this or any previous visit (from the past 24 hour(s)).  Assessment: Julia Levine is a 35 y.o. [phone] with an IUP at [redacted]w[redacted]d presenting for active labor.   Plan: #Labor: expectant management #Pain:  Per request #FWB Cat 1 #ID: GBS: Neg #MOF:  breast  #MOC: BTL #Circ: ues   Charlesetta Garibaldi Digna Countess 02/23/2021, 5:26 AM

## 2021-02-23 NOTE — Progress Notes (Signed)
Patient does not want lactation. Asked for formula. Donor milk was offered,but patient prefers Enfamil. Enfamil given.

## 2021-02-23 NOTE — Progress Notes (Signed)
Patient ID: Julia Levine, female   DOB: 08/05/1985, 35 y.o.   MRN: [id number] Spoke to pt regarding PP BTL. Pt informed that OR's at [facility] are currently shut down due to mechanical problems.  No performing elective cases in the Main OR. Pt offered potential for PP BTL tomorrow in [facility] OR if mechanical problem is corrected but would be late afternoon. Also offered potential as an add on case in the Main OR. NPO status reviewed for these options. I also offered interval tubal. Pt decided on interval tubal. Note sent to FT to schedule pt appt in 2 weeks to discuss and schedule interval BTL.

## 2021-02-24 MED ORDER — IBUPROFEN 600 MG PO TABS: 600.0000 mg | ORAL_TABLET | Freq: Four times a day (QID) | ORAL | 0 refills | Status: DC | PRN

## 2021-02-24 MED ORDER — ACETAMINOPHEN 325 MG PO TABS: 650.0000 mg | ORAL_TABLET | ORAL | Status: DC | PRN

## 2021-02-26 ENCOUNTER — Encounter: Payer: Managed Care, Other (non HMO) | Admitting: Advanced Practice Midwife

## 2021-03-03 ENCOUNTER — Telehealth: Payer: Self-pay

## 2021-03-03 NOTE — Telephone Encounter (Signed)
Returned pt's call from 7/31 @ 1250 to check pt's symptoms. Pt reports feeling better. Pt explained that her planned tubal at time of delivery was unable to be performed d/t OR issues. Pt was transferred to front to schedule her postpartum visit with a physician to discuss tubal. Pt confirmed understanding.

## 2021-03-06 ENCOUNTER — Telehealth (HOSPITAL_COMMUNITY): Payer: Self-pay | Admitting: *Deleted

## 2021-03-06 NOTE — Telephone Encounter (Signed)
Attempted Hospital Discharge Follow-Up Call.  Left voice mail requesting that patient return RN's phone call.  

## 2021-03-28 ENCOUNTER — Encounter: Payer: Self-pay | Admitting: *Deleted

## 2021-03-31 ENCOUNTER — Telehealth: Payer: Managed Care, Other (non HMO) | Admitting: Women's Health

## 2021-03-31 ENCOUNTER — Ambulatory Visit (INDEPENDENT_AMBULATORY_CARE_PROVIDER_SITE_OTHER): Payer: Managed Care, Other (non HMO) | Admitting: Obstetrics & Gynecology

## 2021-03-31 ENCOUNTER — Encounter: Payer: Self-pay | Admitting: Obstetrics & Gynecology

## 2021-03-31 ENCOUNTER — Other Ambulatory Visit: Payer: Self-pay

## 2021-03-31 MED ORDER — DESOGESTREL-ETHINYL ESTRADIOL 0.15-30 MG-MCG PO TABS: 1.0000 | ORAL_TABLET | Freq: Every day | ORAL | 11 refills | Status: DC

## 2021-03-31 NOTE — Progress Notes (Signed)
Subjective:     Julia Levine is a 35 y.o. [phone] female who presents for a postpartum visit. She is 5 weeks postpartum following a spontaneous vaginal delivery. I have fully reviewed the prenatal and intrapartum course. The delivery was at 39  gestational weeks. Outcome: spontaneous vaginal delivery. Anesthesia: none. Postpartum course has been unremarkable. Baby's course has been uncomplicated. Baby is feeding by  bottle . Bleeding no bleeding. Bowel function is normal. Bladder function is normal. Patient is not sexually active. Contraception method is abstinence. Postpartum depression screening: negative.  The following portions of the patient's history were reviewed and updated as appropriate: allergies, current medications, past family history, past medical history, past social history, past surgical history, and problem list.  Review of Systems Pertinent items are noted in HPI.   Objective:    BP (!) 104/55 (BP Location: Left Arm, Patient Position: Sitting, Cuff Size: Normal)   Pulse 79   Wt 202 lb (91.6 kg)   Breastfeeding No   BMI 31.64 kg/m   General:  alert, cooperative, and no distress   Breasts:    Lungs:   Heart:    Abdomen: soft, non-tender; bowel sounds normal; no masses,  no organomegaly   Vulva:    Vagina:   Cervix:    Corpus:   Adnexa:    Rectal Exam:         Assessment:    normal postpartum exam. Pap smear not done at today's visit.   Plan:    1. Contraception: none OCP 2. Plan BTL 05/07/21, plan salpingectomy per pt request 3. Follow up in: 6 weeks or as needed.

## 2021-05-06 NOTE — Telephone Encounter (Signed)
OK let's add her on the 12th, [place] said anytime was good and that keeps the schedule intact.  Give her a call with the details.  Thank you!!

## 2021-05-06 NOTE — Telephone Encounter (Signed)
I dropped the ball it appears.  I have called [place] and am trying to get her on my schedule.  Lavenia Atlas got a call into [place]

## 2021-05-08 NOTE — Patient Instructions (Signed)
Julia Levine  05/08/2021     @PREFPERIOPPHARMACY @   Your procedure is scheduled on  05/14/2021.   Report to [facility] at  1150 A.M.   Call this number if you have problems the morning of surgery:  [phone]   Remember:  Do not eat after midnight.   You may drink clear liquids until 0920.    At 0920, drink your carb drink and nothing else to drink after this.    Take these medicines the morning of surgery with A SIP OF WATER                                   None       Do not wear jewelry, make-up or nail polish.  Do not wear lotions, powders, or perfumes, or deodorant.  Do not shave 48 hours prior to surgery.  Men may shave face and neck.  Do not bring valuables to the hospital.  [facility] is not responsible for any belongings or valuables.  Contacts, dentures or bridgework may not be worn into surgery.  Leave your suitcase in the car.  After surgery it may be brought to your room.  For patients admitted to the hospital, discharge time will be determined by your treatment team.  Patients discharged the day of surgery will not be allowed to drive home and must have someone with them for 24 hours.    Special instructions:         DO NOT smoke tobacco or vape for 24 hours before your procedure.   Please read over the following fact sheets that you were given. Coughing and Deep Breathing, Surgical Site Infection Prevention, Anesthesia Post-op Instructions, and Care and Recovery After Surgery      Salpingectomy, Care After The following information offers guidance on how to care for yourself after your procedure. Your health care provider may also give you more specific instructions. If you have problems or questions, contact your health care provider. What can I expect after the procedure? After the procedure, it is common to have: Pain in your abdomen. Light vaginal bleeding (spotting) for a few days. Tiredness. Your recovery time  will depend on which method was used for your surgery. Follow these instructions at home: Medicines Take over-the-counter and prescription medicines only as told by your health care provider. Ask your health care provider if the medicine prescribed to you: Requires you to avoid driving or using machinery. Can cause constipation. You may need to take actions to prevent or treat constipation, such as: Drink enough fluid to keep your urine pale yellow. Take over-the-counter or prescription medicines. Eat foods that are high in fiber, such as beans, whole grains, and fresh fruits and vegetables. Limit foods that are high in fat and processed sugars, such as fried or sweet foods. Incision care  Follow instructions from your health care provider about how to take care of your incision or incisions. Make sure you: Wash your hands with soap and water for at least 20 seconds before and after you change your bandage (dressing). If soap and water are not available, use hand sanitizer. Change or remove your dressing as told by your health care provider. Leave stitches (sutures), skin glue, staples, or adhesive strips in place. These skin closures may need to stay in place for 2 weeks or longer. If adhesive strip edges start to  loosen and curl up, you may trim the loose edges. Do not remove adhesive strips completely unless your health care provider tells you to do that. Keep your dressing clean and dry. Check your incision area every day for signs of infection. Check for: Redness, swelling, or pain that gets worse. Fluid or blood. Warmth. Pus or a bad smell. Activity Rest as told by your health care provider. Avoid sitting for a long time without moving. Get up to take short walks every 1-2 hours. This is important to improve blood flow and breathing. Ask for help if you feel weak or unsteady. Return to your normal activities as told by your health care provider. Ask your health care provider what  activities are safe for you. Do not drive until your health care provider says that it is safe. Do not lift anything that is heavier than 10 lb (4.5 kg), or the limit that you are told, until your health care provider says that it is safe. This may last for 2-6 weeks depending on your surgery. Do not douche, use tampons, or have sex until your health care provider approves. General instructions Do not use any products that contain nicotine or tobacco. These products include cigarettes, chewing tobacco, and vaping devices, such as e-cigarettes. These can delay healing after surgery. If you need help quitting, ask your health care provider. Wear compression stockings as told by your health care provider. These stockings help to prevent blood clots and reduce swelling in your legs. Do not take baths, swim, or use a hot tub until your health care provider approves. You may take showers. Keep all follow-up visits. This is important. Contact a health care provider if: You have pain when you urinate. You have redness, swelling, or more pain around an incision or an incision feels warm to the touch. You have pus, fluid, blood, or a bad smell coming from an incision or an incision starts to open. You have a fever. You have abdominal pain that gets worse or does not get better with medicine. You have a rash. You feel light-headed, have nausea and vomiting, or both. Get help right away if: You have pain in your chest or leg. You develop shortness of breath. You faint. You have increased or heavy vaginal bleeding, such as soaking a sanitary napkin in an hour. These symptoms may represent a serious problem that is an emergency. Do not wait to see if the symptoms will go away. Get medical help right away. Call your local emergency services (911 in the [place] Do not drive yourself to the hospital. Summary After the procedure, it is common to feel tired, have pain in your abdomen, and have light vaginal  bleeding for a few days. Follow instructions from your health care provider about how to take care of your incision or incisions. Return to your normal activities as told by your health care provider. Ask your health care provider what activities are safe for you. Do not douche, use tampons, or have sex until your health care provider approves. Keep all follow-up visits. This is important. This information is not intended to replace advice given to you by your health care provider. Make sure you discuss any questions you have with your health care provider. Document Revised: 06/11/2020 Document Reviewed: 06/11/2020 Elsevier Patient Education  2022 [employer] General Anesthesia, Adult, Care After This sheet gives you information about how to care for yourself after your procedure. Your health care provider may also give you more specific  instructions. If you have problems or questions, contact your health care provider. What can I expect after the procedure? After the procedure, the following side effects are common: Pain or discomfort at the IV site. Nausea. Vomiting. Sore throat. Trouble concentrating. Feeling cold or chills. Feeling weak or tired. Sleepiness and fatigue. Soreness and body aches. These side effects can affect parts of the body that were not involved in surgery. Follow these instructions at home: For the time period you were told by your health care provider:  Rest. Do not participate in activities where you could fall or become injured. Do not drive or use machinery. Do not drink alcohol. Do not take sleeping pills or medicines that cause drowsiness. Do not make important decisions or sign legal documents. Do not take care of children on your own. Eating and drinking Follow any instructions from your health care provider about eating or drinking restrictions. When you feel hungry, start by eating small amounts of foods that are soft and easy to digest (bland),  such as toast. Gradually return to your regular diet. Drink enough fluid to keep your urine pale yellow. If you vomit, rehydrate by drinking water, juice, or clear broth. General instructions If you have sleep apnea, surgery and certain medicines can increase your risk for breathing problems. Follow instructions from your health care provider about wearing your sleep device: Anytime you are sleeping, including during daytime naps. While taking prescription pain medicines, sleeping medicines, or medicines that make you drowsy. Have a responsible adult stay with you for the time you are told. It is important to have someone help care for you until you are awake and alert. Return to your normal activities as told by your health care provider. Ask your health care provider what activities are safe for you. Take over-the-counter and prescription medicines only as told by your health care provider. If you smoke, do not smoke without supervision. Keep all follow-up visits as told by your health care provider. This is important. Contact a health care provider if: You have nausea or vomiting that does not get better with medicine. You cannot eat or drink without vomiting. You have pain that does not get better with medicine. You are unable to pass urine. You develop a skin rash. You have a fever. You have redness around your IV site that gets worse. Get help right away if: You have difficulty breathing. You have chest pain. You have blood in your urine or stool, or you vomit blood. Summary After the procedure, it is common to have a sore throat or nausea. It is also common to feel tired. Have a responsible adult stay with you for the time you are told. It is important to have someone help care for you until you are awake and alert. When you feel hungry, start by eating small amounts of foods that are soft and easy to digest (bland), such as toast. Gradually return to your regular diet. Drink  enough fluid to keep your urine pale yellow. Return to your normal activities as told by your health care provider. Ask your health care provider what activities are safe for you. This information is not intended to replace advice given to you by your health care provider. Make sure you discuss any questions you have with your health care provider. Document Revised: 04/04/2020 Document Reviewed: 11/02/2019 Elsevier Patient Education  2022 [employer] Inc. How to Use Chlorhexidine for Bathing Chlorhexidine gluconate (CHG) is a germ-killing (antiseptic) solution that is used to clean  the skin. It can get rid of the bacteria that normally live on the skin and can keep them away for about 24 hours. To clean your skin with CHG, you may be given: A CHG solution to use in the shower or as part of a sponge bath. A prepackaged cloth that contains CHG. Cleaning your skin with CHG may help lower the risk for infection: While you are staying in the intensive care unit of the hospital. If you have a vascular access, such as a central line, to provide short-term or long-term access to your veins. If you have a catheter to drain urine from your bladder. If you are on a ventilator. A ventilator is a machine that helps you breathe by moving air in and out of your lungs. After surgery. What are the risks? Risks of using CHG include: A skin reaction. Hearing loss, if CHG gets in your ears and you have a perforated eardrum. Eye injury, if CHG gets in your eyes and is not rinsed out. The CHG product catching fire. Make sure that you avoid smoking and flames after applying CHG to your skin. Do not use CHG: If you have a chlorhexidine allergy or have previously reacted to chlorhexidine. On babies younger than 78 months of age. How to use CHG solution Use CHG only as told by your health care provider, and follow the instructions on the label. Use the full amount of CHG as directed. Usually, this is one  bottle. During a shower Follow these steps when using CHG solution during a shower (unless your health care provider gives you different instructions): Start the shower. Use your normal soap and shampoo to wash your face and hair. Turn off the shower or move out of the shower stream. Pour the CHG onto a clean washcloth. Do not use any type of brush or rough-edged sponge. Starting at your neck, lather your body down to your toes. Make sure you follow these instructions: If you will be having surgery, pay special attention to the part of your body where you will be having surgery. Scrub this area for at least 1 minute. Do not use CHG on your head or face. If the solution gets into your ears or eyes, rinse them well with water. Avoid your genital area. Avoid any areas of skin that have broken skin, cuts, or scrapes. Scrub your back and under your arms. Make sure to wash skin folds. Let the lather sit on your skin for 1-2 minutes or as long as told by your health care provider. Thoroughly rinse your entire body in the shower. Make sure that all body creases and crevices are rinsed well. Dry off with a clean towel. Do not put any substances on your body afterward--such as powder, lotion, or perfume--unless you are told to do so by your health care provider. Only use lotions that are recommended by the manufacturer. Put on clean clothes or pajamas. If it is the night before your surgery, sleep in clean sheets.  During a sponge bath Follow these steps when using CHG solution during a sponge bath (unless your health care provider gives you different instructions): Use your normal soap and shampoo to wash your face and hair. Pour the CHG onto a clean washcloth. Starting at your neck, lather your body down to your toes. Make sure you follow these instructions: If you will be having surgery, pay special attention to the part of your body where you will be having surgery. Scrub this area for  at least 1  minute. Do not use CHG on your head or face. If the solution gets into your ears or eyes, rinse them well with water. Avoid your genital area. Avoid any areas of skin that have broken skin, cuts, or scrapes. Scrub your back and under your arms. Make sure to wash skin folds. Let the lather sit on your skin for 1-2 minutes or as long as told by your health care provider. Using a different clean, wet washcloth, thoroughly rinse your entire body. Make sure that all body creases and crevices are rinsed well. Dry off with a clean towel. Do not put any substances on your body afterward--such as powder, lotion, or perfume--unless you are told to do so by your health care provider. Only use lotions that are recommended by the manufacturer. Put on clean clothes or pajamas. If it is the night before your surgery, sleep in clean sheets. How to use CHG prepackaged cloths Only use CHG cloths as told by your health care provider, and follow the instructions on the label. Use the CHG cloth on clean, dry skin. Do not use the CHG cloth on your head or face unless your health care provider tells you to. When washing with the CHG cloth: Avoid your genital area. Avoid any areas of skin that have broken skin, cuts, or scrapes. Before surgery Follow these steps when using a CHG cloth to clean before surgery (unless your health care provider gives you different instructions): Using the CHG cloth, vigorously scrub the part of your body where you will be having surgery. Scrub using a back-and-forth motion for 3 minutes. The area on your body should be completely wet with CHG when you are done scrubbing. Do not rinse. Discard the cloth and let the area air-dry. Do not put any substances on the area afterward, such as powder, lotion, or perfume. Put on clean clothes or pajamas. If it is the night before your surgery, sleep in clean sheets.  For general bathing Follow these steps when using CHG cloths for general  bathing (unless your health care provider gives you different instructions). Use a separate CHG cloth for each area of your body. Make sure you wash between any folds of skin and between your fingers and toes. Wash your body in the following order, switching to a new cloth after each step: The front of your neck, shoulders, and chest. Both of your arms, under your arms, and your hands. Your stomach and groin area, avoiding the genitals. Your right leg and foot. Your left leg and foot. The back of your neck, your back, and your buttocks. Do not rinse. Discard the cloth and let the area air-dry. Do not put any substances on your body afterward--such as powder, lotion, or perfume--unless you are told to do so by your health care provider. Only use lotions that are recommended by the manufacturer. Put on clean clothes or pajamas. Contact a health care provider if: Your skin gets irritated after scrubbing. You have questions about using your solution or cloth. You swallow any chlorhexidine. Call your local poison control center (779-434-9436 in the [place] Get help right away if: Your eyes itch badly, or they become very red or swollen. Your skin itches badly and is red or swollen. Your hearing changes. You have trouble seeing. You have swelling or tingling in your mouth or throat. You have trouble breathing. These symptoms may represent a serious problem that is an emergency. Do not wait to see if the symptoms  will go away. Get medical help right away. Call your local emergency services (911 in the [place] Do not drive yourself to the hospital. Summary Chlorhexidine gluconate (CHG) is a germ-killing (antiseptic) solution that is used to clean the skin. Cleaning your skin with CHG may help to lower your risk for infection. You may be given CHG to use for bathing. It may be in a bottle or in a prepackaged cloth to use on your skin. Carefully follow your health care provider's instructions and the  instructions on the product label. Do not use CHG if you have a chlorhexidine allergy. Contact your health care provider if your skin gets irritated after scrubbing. This information is not intended to replace advice given to you by your health care provider. Make sure you discuss any questions you have with your health care provider. Document Revised: 09/30/2020 Document Reviewed: 09/30/2020 Elsevier Patient Education  2022 [employer].

## 2021-05-11 ENCOUNTER — Other Ambulatory Visit: Payer: Self-pay | Admitting: Obstetrics & Gynecology

## 2021-05-11 DIAGNOSIS — Z01818 Encounter for other preprocedural examination: Secondary | ICD-10-CM

## 2021-05-12 ENCOUNTER — Other Ambulatory Visit: Payer: Self-pay

## 2021-05-12 ENCOUNTER — Encounter (HOSPITAL_COMMUNITY)
Admission: RE | Admit: 2021-05-12 | Discharge: 2021-05-12 | Disposition: A | Discharge status: Home / Self Care | Payer: Managed Care, Other (non HMO) | Source: Ambulatory Visit | Attending: Obstetrics & Gynecology | Admitting: Obstetrics & Gynecology

## 2021-05-12 DIAGNOSIS — Z01812 Encounter for preprocedural laboratory examination: Secondary | ICD-10-CM | POA: Diagnosis not present

## 2021-05-12 DIAGNOSIS — Z01818 Encounter for other preprocedural examination: Secondary | ICD-10-CM

## 2021-05-12 LAB — RAPID HIV SCREEN (HIV 1/2 AB+AG)
HIV 1/2 Antibodies: NONREACTIVE
HIV-1 P24 Antigen - HIV24: NONREACTIVE

## 2021-05-12 LAB — COMPREHENSIVE METABOLIC PANEL
ALT: 38 U/L (ref 0–44)
AST: 23 U/L (ref 15–41)
Albumin: 4.2 g/dL (ref 3.5–5.0)
Alkaline Phosphatase: 109 U/L (ref 38–126)
Anion gap: 8 (ref 5–15)
BUN: 12 mg/dL (ref 6–20)
CO2: 25 mmol/L (ref 22–32)
Calcium: 8.9 mg/dL (ref 8.9–10.3)
Chloride: 106 mmol/L (ref 98–111)
Creatinine, Ser: 0.71 mg/dL (ref 0.44–1.00)
GFR, Estimated: >60 mL/min (ref >60)
Glucose, Bld: 91 mg/dL (ref 70–99)
Potassium: 3.7 mmol/L (ref 3.5–5.1)
Sodium: 139 mmol/L (ref 135–145)
Total Bilirubin: 0.8 mg/dL (ref 0.3–1.2)
Total Protein: 7.6 g/dL (ref 6.5–8.1)

## 2021-05-12 LAB — CBC
HCT: 43.6 % (ref 36.0–46.0)
Hemoglobin: 13.7 g/dL (ref 12.0–15.0)
MCH: 28.7 pg (ref 26.0–34.0)
MCHC: 31.4 g/dL (ref 30.0–36.0)
MCV: 91.4 fL (ref 80.0–100.0)
Platelets: 202 10*3/uL (ref 150–400)
RBC: 4.77 MIL/uL (ref 3.87–5.11)
RDW: 14.1 % (ref 11.5–15.5)
WBC: 7.9 10*3/uL (ref 4.0–10.5)
nRBC: 0 % (ref 0.0–0.2)

## 2021-05-12 LAB — URINALYSIS, ROUTINE W REFLEX MICROSCOPIC
Bilirubin Urine: NEGATIVE
Glucose, UA: NEGATIVE mg/dL
Hgb urine dipstick: NEGATIVE
Ketones, ur: NEGATIVE mg/dL
Nitrite: NEGATIVE
Protein, ur: NEGATIVE mg/dL
Specific Gravity, Urine: 1.02 (ref 1.005–1.030)
pH: 6 (ref 5.0–8.0)

## 2021-05-12 LAB — HCG, QUANTITATIVE, PREGNANCY: hCG, Beta Chain, Quant, S: <1 m[IU]/mL (ref <5)

## 2021-05-14 ENCOUNTER — Ambulatory Visit (HOSPITAL_COMMUNITY): Payer: Managed Care, Other (non HMO) | Admitting: Anesthesiology

## 2021-05-14 ENCOUNTER — Ambulatory Visit (HOSPITAL_COMMUNITY)
Admission: RE | Admit: 2021-05-14 | Discharge: 2021-05-14 | Disposition: A | Discharge status: Home / Self Care | Payer: Managed Care, Other (non HMO) | Source: Ambulatory Visit | Attending: Obstetrics & Gynecology | Admitting: Obstetrics & Gynecology

## 2021-05-14 ENCOUNTER — Encounter (HOSPITAL_COMMUNITY): Payer: Self-pay | Admitting: Obstetrics & Gynecology

## 2021-05-14 ENCOUNTER — Encounter (HOSPITAL_COMMUNITY)
Admission: RE | Disposition: A | Discharge status: Home / Self Care | Payer: Self-pay | Source: Ambulatory Visit | Attending: Obstetrics & Gynecology

## 2021-05-14 ENCOUNTER — Other Ambulatory Visit: Payer: Self-pay

## 2021-05-14 DIAGNOSIS — Z302 Encounter for sterilization: Secondary | ICD-10-CM | POA: Insufficient documentation

## 2021-05-14 HISTORY — PX: LAPAROSCOPIC BILATERAL SALPINGECTOMY: SHX5889

## 2021-05-14 SURGERY — SALPINGECTOMY, BILATERAL, LAPAROSCOPIC
Anesthesia: General | Site: Abdomen | Laterality: Bilateral

## 2021-05-14 MED ORDER — KETOROLAC TROMETHAMINE 10 MG PO TABS: 10.0000 mg | ORAL_TABLET | Freq: Three times a day (TID) | ORAL | 0 refills | Status: DC | PRN

## 2021-05-14 MED ORDER — BUPIVACAINE LIPOSOME 1.3 % IJ SUSP: 20.0000 mL | Freq: Once | INTRAMUSCULAR | Status: DC

## 2021-05-14 MED ORDER — BUPIVACAINE LIPOSOME 1.3 % IJ SUSP
INTRAMUSCULAR | Status: DC | PRN
  Administered 2021-05-14: 20 mL

## 2021-05-14 MED ORDER — HYDROCODONE-ACETAMINOPHEN 5-325 MG PO TABS: 1.0000 | ORAL_TABLET | Freq: Four times a day (QID) | ORAL | 0 refills | Status: DC | PRN

## 2021-05-14 MED ORDER — FENTANYL CITRATE PF 50 MCG/ML IJ SOSY: 25.0000 ug | PREFILLED_SYRINGE | INTRAMUSCULAR | Status: DC | PRN

## 2021-05-14 MED ORDER — ORAL CARE MOUTH RINSE: 15.0000 mL | Freq: Once | OROMUCOSAL | Status: AC

## 2021-05-14 MED ORDER — ONDANSETRON HCL 4 MG/2ML IJ SOLN: 4.0000 mg | Freq: Once | INTRAMUSCULAR | Status: DC | PRN

## 2021-05-14 MED ORDER — BUPIVACAINE LIPOSOME 1.3 % IJ SUSP
INTRAMUSCULAR | Status: AC
  Filled 2021-05-14: qty 20

## 2021-05-14 MED ORDER — LACTATED RINGERS IV SOLN: INTRAVENOUS | Status: DC

## 2021-05-14 MED ORDER — MEPERIDINE HCL 50 MG/ML IJ SOLN: 6.2500 mg | INTRAMUSCULAR | Status: DC | PRN

## 2021-05-14 MED ORDER — LIDOCAINE HCL (PF) 2 % IJ SOLN
INTRAMUSCULAR | Status: AC
  Filled 2021-05-14: qty 5

## 2021-05-14 MED ORDER — PROPOFOL 10 MG/ML IV BOLUS
INTRAVENOUS | Status: DC | PRN
  Administered 2021-05-14: 200 mg via INTRAVENOUS

## 2021-05-14 MED ORDER — SODIUM CHLORIDE 0.9 % IR SOLN
Status: DC | PRN
  Administered 2021-05-14: 1000 mL

## 2021-05-14 MED ORDER — CHLORHEXIDINE GLUCONATE 0.12 % MT SOLN
15.0000 mL | Freq: Once | OROMUCOSAL | Status: AC
  Administered 2021-05-14: 15 mL via OROMUCOSAL
  Filled 2021-05-14: qty 15

## 2021-05-14 MED ORDER — MIDAZOLAM HCL 2 MG/2ML IJ SOLN
INTRAMUSCULAR | Status: AC
  Filled 2021-05-14: qty 2

## 2021-05-14 MED ORDER — MIDAZOLAM HCL 2 MG/2ML IJ SOLN
INTRAMUSCULAR | Status: DC | PRN
  Administered 2021-05-14: 2 mg via INTRAVENOUS

## 2021-05-14 MED ORDER — DEXAMETHASONE SODIUM PHOSPHATE 10 MG/ML IJ SOLN
INTRAMUSCULAR | Status: DC | PRN
  Administered 2021-05-14: 10 mg via INTRAVENOUS

## 2021-05-14 MED ORDER — LIDOCAINE HCL (CARDIAC) PF 100 MG/5ML IV SOSY
PREFILLED_SYRINGE | INTRAVENOUS | Status: DC | PRN
  Administered 2021-05-14: 60 mg via INTRATRACHEAL

## 2021-05-14 MED ORDER — DEXAMETHASONE SODIUM PHOSPHATE 10 MG/ML IJ SOLN
INTRAMUSCULAR | Status: AC
  Filled 2021-05-14: qty 1

## 2021-05-14 MED ORDER — CEFAZOLIN SODIUM-DEXTROSE 2-4 GM/100ML-% IV SOLN
2.0000 g | INTRAVENOUS | Status: AC
  Administered 2021-05-14: 2 g via INTRAVENOUS
  Filled 2021-05-14: qty 100

## 2021-05-14 MED ORDER — KETOROLAC TROMETHAMINE 30 MG/ML IJ SOLN
30.0000 mg | Freq: Once | INTRAMUSCULAR | Status: AC
  Administered 2021-05-14: 30 mg via INTRAVENOUS
  Filled 2021-05-14: qty 1

## 2021-05-14 MED ORDER — ONDANSETRON HCL 4 MG/2ML IJ SOLN
INTRAMUSCULAR | Status: DC | PRN
  Administered 2021-05-14: 4 mg via INTRAVENOUS

## 2021-05-14 MED ORDER — ROCURONIUM BROMIDE 10 MG/ML (PF) SYRINGE
PREFILLED_SYRINGE | INTRAVENOUS | Status: AC
  Filled 2021-05-14: qty 10

## 2021-05-14 MED ORDER — ONDANSETRON HCL 4 MG/2ML IJ SOLN
INTRAMUSCULAR | Status: AC
  Filled 2021-05-14: qty 2

## 2021-05-14 MED ORDER — FENTANYL CITRATE (PF) 100 MCG/2ML IJ SOLN
INTRAMUSCULAR | Status: DC | PRN
  Administered 2021-05-14: 100 ug via INTRAVENOUS
  Administered 2021-05-14 (×2): 50 ug via INTRAVENOUS

## 2021-05-14 MED ORDER — ONDANSETRON 8 MG PO TBDP: 8.0000 mg | ORAL_TABLET | Freq: Three times a day (TID) | ORAL | 0 refills | Status: DC | PRN

## 2021-05-14 MED ORDER — POVIDONE-IODINE 10 % EX SWAB: 2.0000 "application " | Freq: Once | CUTANEOUS | Status: DC

## 2021-05-14 MED ORDER — FENTANYL CITRATE (PF) 250 MCG/5ML IJ SOLN
INTRAMUSCULAR | Status: AC
  Filled 2021-05-14: qty 5

## 2021-05-14 MED ORDER — ROCURONIUM BROMIDE 10 MG/ML (PF) SYRINGE
PREFILLED_SYRINGE | INTRAVENOUS | Status: DC | PRN
  Administered 2021-05-14: 50 mg via INTRAVENOUS

## 2021-05-14 MED ORDER — SUGAMMADEX SODIUM 500 MG/5ML IV SOLN
INTRAVENOUS | Status: DC | PRN
  Administered 2021-05-14: 250 mg via INTRAVENOUS

## 2021-05-14 SURGICAL SUPPLY — 39 items
ADH SKN CLS APL DERMABOND .7 (GAUZE/BANDAGES/DRESSINGS) ×1
APPLIER CLIP 5 13 M/L LIGAMAX5 (MISCELLANEOUS)
APR CLP MED LRG 5 ANG JAW (MISCELLANEOUS)
BAG HAMPER (MISCELLANEOUS) ×2 IMPLANT
BLADE SURG SZ11 CARB STEEL (BLADE) ×2 IMPLANT
CLIP APPLIE 5 13 M/L LIGAMAX5 (MISCELLANEOUS) IMPLANT
CLOTH BEACON ORANGE TIMEOUT ST (SAFETY) ×2 IMPLANT
COVER LIGHT HANDLE STERIS (MISCELLANEOUS) ×4 IMPLANT
DERMABOND ADVANCED (GAUZE/BANDAGES/DRESSINGS) ×1
DERMABOND ADVANCED .7 DNX12 (GAUZE/BANDAGES/DRESSINGS) ×1 IMPLANT
ELECT REM PT RETURN 9FT ADLT (ELECTROSURGICAL) ×2
ELECTRODE REM PT RTRN 9FT ADLT (ELECTROSURGICAL) ×1 IMPLANT
GAUZE 4X4 16PLY ~~LOC~~+RFID DBL (SPONGE) ×4 IMPLANT
GLOVE ECLIPSE 8.0 STRL XLNG CF (GLOVE) ×2 IMPLANT
GLOVE SRG 8 PF TXTR STRL LF DI (GLOVE) ×1 IMPLANT
GLOVE SURG UNDER POLY LF SZ7 (GLOVE) ×6 IMPLANT
GLOVE SURG UNDER POLY LF SZ8 (GLOVE) ×2
GOWN STRL REUS W/TWL LRG LVL3 (GOWN DISPOSABLE) ×2 IMPLANT
GOWN STRL REUS W/TWL XL LVL3 (GOWN DISPOSABLE) ×2 IMPLANT
INST SET LAPROSCOPIC GYN AP (KITS) ×2 IMPLANT
KIT TURNOVER CYSTO (KITS) ×2 IMPLANT
NEEDLE HYPO 18GX1.5 BLUNT FILL (NEEDLE) ×2 IMPLANT
NEEDLE HYPO 21X1.5 SAFETY (NEEDLE) ×2 IMPLANT
NEEDLE INSUFFLATION 14GA 120MM (NEEDLE) ×2 IMPLANT
PACK PERI GYN (CUSTOM PROCEDURE TRAY) ×2 IMPLANT
PAD ARMBOARD 7.5X6 YLW CONV (MISCELLANEOUS) ×2 IMPLANT
SET BASIN LINEN APH (SET/KITS/TRAYS/PACK) ×2 IMPLANT
SET TUBE SMOKE EVAC HIGH FLOW (TUBING) ×2 IMPLANT
SHEARS HARMONIC ACE PLUS 36CM (ENDOMECHANICALS) ×2 IMPLANT
SLEEVE ENDOPATH XCEL 5M (ENDOMECHANICALS) ×2 IMPLANT
SOL ANTI FOG 6CC (MISCELLANEOUS) ×1 IMPLANT
SOLUTION ANTI FOG 6CC (MISCELLANEOUS) ×1
SUT VICRYL 0 UR6 27IN ABS (SUTURE) ×2 IMPLANT
SUT VICRYL AB 3-0 FS1 BRD 27IN (SUTURE) ×4 IMPLANT
SYR 10ML LL (SYRINGE) ×2 IMPLANT
SYR 20ML LL LF (SYRINGE) ×4 IMPLANT
TROCAR ENDO BLADELESS 11MM (ENDOMECHANICALS) ×2 IMPLANT
TROCAR XCEL NON-BLD 5MMX100MML (ENDOMECHANICALS) ×2 IMPLANT
WARMER LAPAROSCOPE (MISCELLANEOUS) ×2 IMPLANT

## 2021-05-14 NOTE — Anesthesia Preprocedure Evaluation (Signed)
Anesthesia Evaluation  Patient identified by MRN, date of birth, ID band Patient awake    Reviewed: Allergy & Precautions, NPO status , Patient's Chart, lab work & pertinent test results  History of Anesthesia Complications Negative for: history of anesthetic complications  Airway Mallampati: I  TM Distance: >3 FB Neck ROM: Full    Dental  (+) Dental Advisory Given, Teeth Intact   Pulmonary neg pulmonary ROS,    Pulmonary exam normal breath sounds clear to auscultation       Cardiovascular Exercise Tolerance: Good Normal cardiovascular exam Rhythm:Regular Rate:Normal     Neuro/Psych negative neurological ROS  negative psych ROS   GI/Hepatic negative GI ROS, Neg liver ROS,   Endo/Other  negative endocrine ROS  Renal/GU negative Renal ROS     Musculoskeletal negative musculoskeletal ROS (+)   Abdominal   Peds  Hematology negative hematology ROS (+)   Anesthesia Other Findings   Reproductive/Obstetrics negative OB ROS                            Anesthesia Physical Anesthesia Plan  ASA: 1  Anesthesia Plan: General   Post-op Pain Management:    Induction: Intravenous  PONV Risk Score and Plan: Ondansetron, Dexamethasone and Midazolam  Airway Management Planned: Oral ETT  Additional Equipment:   Intra-op Plan:   Post-operative Plan: Extubation in OR  Informed Consent: I have reviewed the patients History and Physical, chart, labs and discussed the procedure including the risks, benefits and alternatives for the proposed anesthesia with the patient or authorized representative who has indicated his/her understanding and acceptance.     Dental advisory given  Plan Discussed with: CRNA and Surgeon  Anesthesia Plan Comments:         Anesthesia Quick Evaluation

## 2021-05-14 NOTE — Transfer of Care (Signed)
Immediate Anesthesia Transfer of Care Note  Patient: Julia Levine  Procedure(s) Performed: LAPAROSCOPIC BILATERAL SALPINGECTOMY (Bilateral: Abdomen)  Patient Location: PACU  Anesthesia Type:General  Level of Consciousness: drowsy  Airway & Oxygen Therapy: Patient Spontanous Breathing and Patient connected to nasal cannula oxygen  Post-op Assessment: Report given to RN and Post -op Vital signs reviewed and stable  Post vital signs: Reviewed and stable  Last Vitals:  Vitals Value Taken Time  BP 119/54   Temp    Pulse 90 05/14/21 1310  Resp 15 05/14/21 1310  SpO2 97 % 05/14/21 1310  Vitals shown include unvalidated device data.  Last Pain:  Vitals:   05/14/21 1105  TempSrc: Oral  PainSc: 0-No pain         Complications: No notable events documented.

## 2021-05-14 NOTE — Anesthesia Postprocedure Evaluation (Signed)
Anesthesia Post Note  Patient: Julia Levine  Procedure(s) Performed: LAPAROSCOPIC BILATERAL SALPINGECTOMY (Bilateral: Abdomen)  Patient location during evaluation: PACU Anesthesia Type: General Level of consciousness: awake and alert Pain management: pain level controlled Vital Signs Assessment: post-procedure vital signs reviewed and stable Respiratory status: spontaneous breathing, nonlabored ventilation and respiratory function stable Cardiovascular status: blood pressure returned to baseline and stable Postop Assessment: no apparent nausea or vomiting Anesthetic complications: no   No notable events documented.   Last Vitals:  Vitals:   05/14/21 1345 05/14/21 1356  BP: 111/60 111/68  Pulse: 82 79  Resp: 13 13  Temp:  (!) 36.4 C  SpO2: 99% 100%    Last Pain:  Vitals:   05/14/21 1356  TempSrc: Oral  PainSc: 0-No pain                 Jadin Kagel C [place] Jr

## 2021-05-14 NOTE — Op Note (Signed)
Preoperative Diagnosis:  1.  Multiparous female desires permanent sterilization                                          2.  Elects to have bilateral salpingectomy for ovarian cancer prophylaxis  Postoperative Diagnosis:  Same as above  Procedure:  Laparoscopic Bilateral Salpingectomy for the purpose of permanent sterilization  Surgeon:  Rockne Coons MD  Anaesthesia: general  Findings:  Patient had normal pelvic anatomy and no intraperitoneal abnormalities.  Description of Operation:  Patient was taken to the OR and placed into supine position where she underwent general anaesthesia.   She was placed in the dorsal lithotomy position and prepped and draped in the usual sterile fashion.   An elliptical supraumbilical incision was made and dissection taken down to the rectus fascia. A Veres needle was used to insufflate the periotneal cavity. An 11 mm non bladed video laparoscope trocar was then placed under direct visualization without difficulty.   The above noted findings were observed.   Two additional 5 mm non bladed trocars were placed in the right and left lower quadrants under direct visualization without difficulty.   The Harmonic scalpel was employed and a salpingectomy of both the right and left tubes was performed.   The tubes were removed from the peritoneal cavity and sent to pathology.   There was good hemostasis bilaterally.   The fascia, peritoneum and subcutaneous tissue were closed using 0 vicryl.   All 3 skin incisions were closed using 3-0 vicryl in a subcuticular fashion.  Exparel 266 mg 20 cc was injected in the 3 incisional/trocar sites.  The patient was awakened from anaesthesia and taken to the PACU with all counts being correct x 3.   The patient received  2 gram of ancef andToradol 30 mg IV preoperatively.  Julia Levine 05/14/2021 1:08 PM

## 2021-05-14 NOTE — Anesthesia Procedure Notes (Signed)
Procedure Name: Intubation Date/Time: 05/14/2021 12:17 PM Performed by: Karna Dupes, CRNA Pre-anesthesia Checklist: Patient identified, Emergency Drugs available, Suction available and Patient being monitored Patient Re-evaluated:Patient Re-evaluated prior to induction Oxygen Delivery Method: Circle system utilized Preoxygenation: Pre-oxygenation with 100% oxygen Induction Type: IV induction Ventilation: Mask ventilation without difficulty Laryngoscope Size: Mac and 3 Grade View: Grade I Tube type: Oral Tube size: 7.0 mm Number of attempts: 1 Airway Equipment and Method: Stylet Placement Confirmation: ETT inserted through vocal cords under direct vision, positive ETCO2 and breath sounds checked- equal and bilateral Secured at: 21 cm Tube secured with: Tape Dental Injury: Teeth and Oropharynx as per pre-operative assessment

## 2021-05-14 NOTE — H&P (Signed)
Preoperative History and Physical  Julia Levine is a 35 y.o. [phone] with No LMP recorded. admitted for a laparoscopic bilateral salpingectomy for permanent sterilization, patient opted for salpingectomy for lower failure rate and ovarian cancer prophylaxis.    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    PSH:     Past Surgical History:  Procedure Laterality Date   COLPOSCOPY  10/2011   abnormal pap HPV    POb/GynH:      OB History     Gravida  5   Para  3   Term  2   Preterm  1   AB  1   Living  2      SAB  1   IAB  0   Ectopic  0   Multiple  1   Live Births  4           SH:   Social History   Tobacco Use   Smoking status: Never   Smokeless tobacco: Never  Vaping Use   Vaping Use: Never used  Substance Use Topics   Alcohol use: Not Currently   Drug use: No    FH:    Family History  Problem Relation Age of Onset   Hypertension Mother    Mitral valve prolapse Mother    Cancer Maternal Grandmother 63       cervical     Allergies: No Known Allergies  Medications:       Current Facility-Administered Medications:    bupivacaine liposome (EXPAREL) 1.3 % injection 266 mg, 20 mL, Infiltration, Once, Oretha Weismann, Amaryllis Dyke, MD   ceFAZolin (ANCEF) IVPB 2g/100 mL premix, 2 g, Intravenous, On Call to OR, Lazaro Arms, MD   lactated ringers infusion, , Intravenous, Continuous, Battula, Rajamani C, MD, Last Rate: 50 mL/hr at 05/14/21 1111, New Bag at 05/14/21 1111   povidone-iodine 10 % swab 2 application, 2 application, Topical, Once, Lazaro Arms, MD  Review of Systems:   Review of Systems  Constitutional: Negative for fever, chills, weight loss, malaise/fatigue and diaphoresis.  HENT: Negative for hearing loss, ear pain, nosebleeds, congestion, sore throat, neck pain, tinnitus and ear discharge.   Eyes: Negative for blurred vision, double vision, photophobia, pain, discharge and redness.   Respiratory: Negative for cough, hemoptysis, sputum production, shortness of breath, wheezing and stridor.   Cardiovascular: Negative for chest pain, palpitations, orthopnea, claudication, leg swelling and PND.  Gastrointestinal: Positive for abdominal pain. Negative for heartburn, nausea, vomiting, diarrhea, constipation, blood in stool and melena.  Genitourinary: Negative for dysuria, urgency, frequency, hematuria and flank pain.  Musculoskeletal: Negative for myalgias, back pain, joint pain and falls.  Skin: Negative for itching and rash.  Neurological: Negative for dizziness, tingling, tremors, sensory change, speech change, focal weakness, seizures, loss of consciousness, weakness and headaches.  Endo/Heme/Allergies: Negative for environmental allergies and polydipsia. Does not bruise/bleed easily.  Psychiatric/Behavioral: Negative for depression, suicidal ideas, hallucinations, memory loss and substance abuse. The patient is not nervous/anxious and does not have insomnia.      PHYSICAL EXAM:  Blood pressure 131/72, pulse 79, temperature 98.4 F (36.9 C), temperature source Oral, resp. rate 18, SpO2 98 %.    Vitals reviewed. Constitutional: She is oriented to person, place, and time. She appears well-developed and well-nourished.  HENT:  Head: Normocephalic and atraumatic.  Right Ear: External ear normal.  Left Ear: External ear normal.  Nose: Nose normal.  Mouth/Throat: Oropharynx is clear and moist.  Eyes: Conjunctivae and EOM are normal. Pupils are equal, round, and reactive to light. Right eye exhibits no discharge. Left eye exhibits no discharge. No scleral icterus.  Neck: Normal range of motion. Neck supple. No tracheal deviation present. No thyromegaly present.  Cardiovascular: Normal rate, regular rhythm, normal heart sounds and intact distal pulses.  Exam reveals no gallop and no friction rub.   No murmur heard. Respiratory: Effort normal and breath sounds normal. No  respiratory distress. She has no wheezes. She has no rales. She exhibits no tenderness.  GI: Soft. Bowel sounds are normal. She exhibits no distension and no mass. There is tenderness. There is no rebound and no guarding.  Genitourinary:       Vulva is normal without lesions Vagina is pink moist without discharge Cervix normal in appearance and pap is normal Uterus is normal size, contour, position, consistency, mobility, non-tender Adnexa is negative with normal sized ovaries by sonogram  Musculoskeletal: Normal range of motion. She exhibits no edema and no tenderness.  Neurological: She is alert and oriented to person, place, and time. She has normal reflexes. She displays normal reflexes. No cranial nerve deficit. She exhibits normal muscle tone. Coordination normal.  Skin: Skin is warm and dry. No rash noted. No erythema. No pallor.  Psychiatric: She has a normal mood and affect. Her behavior is normal. Judgment and thought content normal.    Labs: Results for orders placed or performed during the hospital encounter of 05/12/21 (from the past 336 hour(s))  CBC   Collection Time: 05/12/21  3:49 PM  Result Value Ref Range   WBC 7.9 4.0 - 10.5 K/uL   RBC 4.77 3.87 - 5.11 MIL/uL   Hemoglobin 13.7 12.0 - 15.0 g/dL   HCT 63.8 75.6 - 43.3 %   MCV 91.4 80.0 - 100.0 fL   MCH 28.7 26.0 - 34.0 pg   MCHC 31.4 30.0 - 36.0 g/dL   RDW 29.5 18.8 - 41.6 %   Platelets 202 150 - 400 K/uL   nRBC 0.0 0.0 - 0.2 %  Comprehensive metabolic panel   Collection Time: 05/12/21  3:49 PM  Result Value Ref Range   Sodium 139 135 - 145 mmol/L   Potassium 3.7 3.5 - 5.1 mmol/L   Chloride 106 98 - 111 mmol/L   CO2 25 22 - 32 mmol/L   Glucose, Bld 91 70 - 99 mg/dL   BUN 12 6 - 20 mg/dL   Creatinine, Ser 6.06 0.44 - 1.00 mg/dL   Calcium 8.9 8.9 - 30.1 mg/dL   Total Protein 7.6 6.5 - 8.1 g/dL   Albumin 4.2 3.5 - 5.0 g/dL   AST 23 15 - 41 U/L   ALT 38 0 - 44 U/L   Alkaline Phosphatase 109 38 - 126 U/L    Total Bilirubin 0.8 0.3 - 1.2 mg/dL   GFR, Estimated >60 >10 mL/min   Anion gap 8 5 - 15  hCG, quantitative, pregnancy   Collection Time: 05/12/21  3:49 PM  Result Value Ref Range   hCG, Beta Chain, Quant, S <1 <5 mIU/mL  Rapid HIV screen (HIV 1/2 Ab+Ag)   Collection Time: 05/12/21  3:49 PM  Result Value Ref Range   HIV-1 P24 Antigen - HIV24 NON REACTIVE NON REACTIVE   HIV 1/2 Antibodies NON REACTIVE NON REACTIVE   Interpretation (HIV Ag Ab)      A non reactive test result means that HIV 1 or HIV 2  antibodies and HIV 1 p24 antigen were not detected in the specimen.  Urinalysis, Routine w reflex microscopic Urine, Clean Catch   Collection Time: 05/12/21  3:49 PM  Result Value Ref Range   Color, Urine YELLOW YELLOW   APPearance HAZY (A) CLEAR   Specific Gravity, Urine 1.020 1.005 - 1.030   pH 6.0 5.0 - 8.0   Glucose, UA NEGATIVE NEGATIVE mg/dL   Hgb urine dipstick NEGATIVE NEGATIVE   Bilirubin Urine NEGATIVE NEGATIVE   Ketones, ur NEGATIVE NEGATIVE mg/dL   Protein, ur NEGATIVE NEGATIVE mg/dL   Nitrite NEGATIVE NEGATIVE   Leukocytes,Ua MODERATE (A) NEGATIVE   RBC / HPF 0-5 0 - 5 RBC/hpf   WBC, UA 0-5 0 - 5 WBC/hpf   Bacteria, UA FEW (A) NONE SEEN   Squamous Epithelial / LPF 6-10 0 - 5   Mucus PRESENT     EKG: Orders placed or performed during the hospital encounter of 05/09/12   EKG 12-Lead   EKG 12-Lead   EKG 12-Lead   EKG 12-Lead   EKG    Imaging Studies: No results found.    Assessment: Multiparous female desires permanent sterilization, opts for salpingectomy  Plan: Laparoscopic bilateral salpingectomy for sterilization  Lazaro Arms 05/14/2021 11:49 AM

## 2021-05-15 ENCOUNTER — Encounter (HOSPITAL_COMMUNITY): Payer: Self-pay | Admitting: Obstetrics & Gynecology

## 2021-05-15 LAB — SURGICAL PATHOLOGY

## 2021-05-16 ENCOUNTER — Telehealth: Payer: Managed Care, Other (non HMO) | Admitting: Obstetrics & Gynecology

## 2021-06-03 ENCOUNTER — Telehealth: Payer: Managed Care, Other (non HMO) | Admitting: Obstetrics & Gynecology

## 2021-08-28 DIAGNOSIS — Z23 Encounter for immunization: Secondary | ICD-10-CM | POA: Diagnosis not present

## 2022-05-07 ENCOUNTER — Encounter: Payer: Self-pay | Admitting: Adult Health

## 2022-05-07 ENCOUNTER — Ambulatory Visit (INDEPENDENT_AMBULATORY_CARE_PROVIDER_SITE_OTHER): Payer: Commercial Managed Care - PPO | Admitting: Adult Health

## 2022-05-07 ENCOUNTER — Other Ambulatory Visit (HOSPITAL_COMMUNITY)
Admission: RE | Admit: 2022-05-07 | Discharge: 2022-05-07 | Disposition: A | Discharge status: Home / Self Care | Payer: Commercial Managed Care - PPO | Source: Ambulatory Visit | Attending: Adult Health | Admitting: Adult Health

## 2022-05-07 VITALS — BP 109/66 | HR 87 | Ht 67.0 in | Wt 197.0 lb

## 2022-05-07 DIAGNOSIS — N760 Acute vaginitis: Secondary | ICD-10-CM

## 2022-05-07 DIAGNOSIS — N898 Other specified noninflammatory disorders of vagina: Secondary | ICD-10-CM | POA: Insufficient documentation

## 2022-05-07 DIAGNOSIS — B9689 Other specified bacterial agents as the cause of diseases classified elsewhere: Secondary | ICD-10-CM | POA: Insufficient documentation

## 2022-05-07 DIAGNOSIS — Z124 Encounter for screening for malignant neoplasm of cervix: Secondary | ICD-10-CM | POA: Insufficient documentation

## 2022-05-07 LAB — POCT WET PREP (WET MOUNT): Clue Cells Wet Prep Whiff POC: POSITIVE

## 2022-05-07 MED ORDER — METRONIDAZOLE 500 MG PO TABS: 500.0000 mg | ORAL_TABLET | Freq: Two times a day (BID) | ORAL | 0 refills | Status: DC

## 2022-05-07 NOTE — Progress Notes (Signed)
  Subjective:     Patient ID: Julia Levine, female   DOB: Aug 28, 1985, 36 y.o.   MRN: [id number]  HPI  Julia Levine is a 36 year old white female, married, [id number] in complaining of odor after her period and sex. No itching or burning.  She needs a pap.  Review of Systems Has odor after period and sex Denies any burning or itching  Reviewed past medical,surgical, social and family history. Reviewed medications and allergies.     Objective:   Physical Exam BP 109/66 (BP Location: Left Arm, Patient Position: Sitting, Cuff Size: Normal)   Pulse 87   Ht 5\' 7"  (1.702 m)   Wt 197 lb (89.4 kg)   LMP 04/27/2022   BMI 30.85 kg/m     Skin warm and dry.Pelvic: external genitalia is normal in appearance no lesions, vagina: creamy discharge with odor,urethra has no lesions or masses noted, cervix:smooth and bulbous,pap with GC/CHL and HR HPV genotyping performed, uterus: normal size, shape and contour, non tender, no masses felt, adnexa: no masses or tenderness noted. Bladder is non tender and no masses felt. Wet prep: + for clue cells +whiff.  Fall risk is low  Upstream - 05/07/22 1020       Pregnancy Intention Screening   Does the patient want to become pregnant in the next year? No    Does the patient's partner want to become pregnant in the next year? No    Would the patient like to discuss contraceptive options today? No      Contraception Wrap Up   Current Method Female Sterilization    End Method Female Sterilization            Examination chaperoned by Levy Pupa LPN  Assessment:     1. Routine Papanicolaou smear Pap sent Pap in 3 years if normal Physical in 1 year  - Cytology - PAP( [place] [facility])  2. Vaginal discharge Creamy discharge with odor  - POCT Wet Prep ([facility])  3. Vaginal odor + odor - POCT Wet Prep [facility])  4. BV (bacterial vaginosis) +discharge and odor No burning or itching Will rx flagyl Meds ordered this encounter  Medications    metroNIDAZOLE (FLAGYL) 500 MG tablet    Sig: Take 1 tablet (500 mg total) by mouth 2 (two) times daily.    Dispense:  14 tablet    Refill:  0    Order Specific Question:   Supervising Provider    Answer:   Florian Buff [[id number]]    - POCT Wet Prep [facility])     Plan:     Return in 1 year of physical and labs or sooner if needed

## 2022-05-11 LAB — CYTOLOGY - PAP
Chlamydia: NEGATIVE
Comment: NEGATIVE
Comment: NEGATIVE
Comment: NORMAL
Diagnosis: NEGATIVE
High risk HPV: NEGATIVE
Neisseria Gonorrhea: NEGATIVE

## 2023-05-26 ENCOUNTER — Ambulatory Visit: Payer: Medicaid Other | Admitting: Advanced Practice Midwife

## 2023-06-09 ENCOUNTER — Encounter: Payer: Self-pay | Admitting: Advanced Practice Midwife

## 2023-06-09 ENCOUNTER — Ambulatory Visit (INDEPENDENT_AMBULATORY_CARE_PROVIDER_SITE_OTHER): Payer: 59 | Admitting: Advanced Practice Midwife

## 2023-06-09 ENCOUNTER — Other Ambulatory Visit (HOSPITAL_COMMUNITY)
Admission: RE | Admit: 2023-06-09 | Discharge: 2023-06-09 | Disposition: A | Discharge status: Home / Self Care | Payer: 59 | Source: Ambulatory Visit | Attending: Advanced Practice Midwife | Admitting: Advanced Practice Midwife

## 2023-06-09 VITALS — BP 113/70 | HR 75 | Ht 67.0 in | Wt 202.0 lb

## 2023-06-09 DIAGNOSIS — Z01419 Encounter for gynecological examination (general) (routine) without abnormal findings: Secondary | ICD-10-CM

## 2023-06-09 DIAGNOSIS — N898 Other specified noninflammatory disorders of vagina: Secondary | ICD-10-CM

## 2023-06-09 NOTE — Progress Notes (Signed)
WELL-WOMAN EXAMINATION Patient name: Julia Levine MRN [id number]  Date of birth: [birth year] Chief Complaint:   Annual Exam  History of Present Illness:   Julia Levine is a 37 y.o. [phone] Caucasian female being seen today for a routine well-woman exam.  Current complaints: overall doing well, but noticing a vag odor x 2-3d after her cycle stops; it self-resolves; no itching or irritation; it isn't currently happening, but desires testing  PCP: none      does not desire labs Patient's last menstrual period was 06/02/2023. The current method of family planning is tubal ligation.  Last pap Oct 2023. Results were: NILM w/ HRHPV negative. 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 Results were: N/A. Family h/o breast cancer: no Last colonoscopy: never. Results were: N/A. Family h/o colorectal cancer: no     06/09/2023    2:05 PM 02/05/2021    4:27 PM 12/18/2020    8:52 AM 09/04/2020   10:20 AM 07/23/2020   10:39 AM  Depression screen PHQ 2/9  Decreased Interest 0 0 0 0 0  Down, Depressed, Hopeless 0 0 0 0 0  PHQ - 2 Score 0 0 0 0 0  Altered sleeping 1 1 2  0 0  Tired, decreased energy 1 1 1 1 1   Change in appetite 0 0 0 1 1  Feeling bad or failure about yourself  0 0 0 0 0  Trouble concentrating 0 0 0 0 0  Moving slowly or fidgety/restless 0 0 0 0 0  Suicidal thoughts 0 0 0 0 0  PHQ-9 Score 2 2 3 2 2         06/09/2023    2:05 PM 02/05/2021    4:28 PM 12/18/2020    8:52 AM 09/04/2020   10:20 AM  GAD 7 : Generalized Anxiety Score  Nervous, Anxious, on Edge 0 1 1 0  Control/stop worrying 1 0 0 0  Worry too much - different things 0 0 0 0  Trouble relaxing 0 0 0 0  Restless 0 1 0 0  Easily annoyed or irritable 1 0 1 1  Afraid - awful might happen 0 0 0 0  Total GAD 7 Score 2 2 2 1      Review of Systems:   Pertinent items are noted in HPI Denies any headaches, blurred vision, fatigue, shortness of breath, chest pain, abdominal pain, abnormal  vaginal discharge/itching/odor/irritation, problems with periods, bowel movements, urination, or intercourse unless otherwise stated above. Pertinent History Reviewed:  Reviewed past medical,surgical, social and family history.  Reviewed problem list, medications and allergies. Physical Assessment:   Vitals:   06/09/23 1359  BP: 113/70  Pulse: 75  Weight: 202 lb (91.6 kg)  Height: 5\' 7"  (1.702 m)  Body mass index is 31.64 kg/m.        Physical Examination:   General appearance - well appearing, and in no distress  Mental status - alert, oriented to person, place, and time  Psych:  She has a normal mood and affect  Skin - warm and dry, normal color, no suspicious lesions noted  Chest - effort normal, all lung fields clear to auscultation bilaterally  Heart - normal rate and regular rhythm  Breasts - breasts appear normal, no suspicious masses, no skin or nipple changes or  axillary nodes  Abdomen - soft, nontender, nondistended, no masses or organomegaly  Pelvic - VULVA: normal appearing vulva with no masses, tenderness or lesions  VAGINA: normal  appearing vagina with normal color and discharge, no lesions  CERVIX: normal appearing cervix without discharge or lesions, no CMT  Thin prep pap is not done   UTERUS: uterus is felt to be normal size, shape, consistency and nontender   ADNEXA: No adnexal masses or tenderness noted.  Rectal - not examined  Extremities:  No swelling or varicosities noted   No results found for this or any previous visit (from the past 24 hour(s)).  Assessment & Plan:  1) Well-Woman Exam  2) Cyclical vag odor, occurs x 2-3d after cycle finishes; CV swab desired x 5 items  Labs/procedures today: CV  Mammogram: @ 36yo, or sooner if problems Colonoscopy: @ 36yo, or sooner if problems  No orders of the defined types were placed in this encounter.   Meds: No orders of the defined types were placed in this encounter.   Follow-up: Return in about 1  year (around 06/08/2024) for Physical.  Arabella Merles [facility] 06/09/2023 2:59 PM

## 2023-06-09 NOTE — Addendum Note (Signed)
Addended by: Moss Mc on: 06/09/2023 04:13 PM   Modules accepted: Orders

## 2023-06-11 LAB — CERVICOVAGINAL ANCILLARY ONLY
Bacterial Vaginitis (gardnerella): POSITIVE — AB
Candida Glabrata: NEGATIVE
Candida Vaginitis: NEGATIVE
Chlamydia: NEGATIVE
Comment: NEGATIVE
Comment: NEGATIVE
Comment: NEGATIVE
Comment: NEGATIVE
Comment: NEGATIVE
Comment: NORMAL
Neisseria Gonorrhea: NEGATIVE
Trichomonas: NEGATIVE

## 2023-06-12 ENCOUNTER — Other Ambulatory Visit: Payer: Self-pay | Admitting: Advanced Practice Midwife

## 2023-06-12 DIAGNOSIS — B9689 Other specified bacterial agents as the cause of diseases classified elsewhere: Secondary | ICD-10-CM

## 2023-06-12 MED ORDER — METRONIDAZOLE 500 MG PO TABS: 500.0000 mg | ORAL_TABLET | Freq: Two times a day (BID) | ORAL | 0 refills | Status: AC

## 2023-08-02 ENCOUNTER — Ambulatory Visit
Admission: RE | Admit: 2023-08-02 | Discharge: 2023-08-02 | Disposition: A | Discharge status: Home / Self Care | Payer: 59 | Source: Ambulatory Visit | Attending: Nurse Practitioner | Admitting: Nurse Practitioner

## 2023-08-02 VITALS — BP 104/57 | HR 99 | Temp 98.2°F | Resp 16

## 2023-08-02 DIAGNOSIS — S39012A Strain of muscle, fascia and tendon of lower back, initial encounter: Secondary | ICD-10-CM

## 2023-08-02 MED ORDER — METHOCARBAMOL 500 MG PO TABS: 500.0000 mg | ORAL_TABLET | Freq: Two times a day (BID) | ORAL | 0 refills | Status: AC

## 2023-08-02 MED ORDER — KETOROLAC TROMETHAMINE 30 MG/ML IJ SOLN
30.0000 mg | Freq: Once | INTRAMUSCULAR | Status: AC
  Administered 2023-08-02: 30 mg via INTRAMUSCULAR

## 2023-08-02 MED ORDER — PREDNISONE 20 MG PO TABS: 40.0000 mg | ORAL_TABLET | Freq: Every day | ORAL | 0 refills | Status: AC

## 2023-08-02 MED ORDER — DEXAMETHASONE SODIUM PHOSPHATE 10 MG/ML IJ SOLN
10.0000 mg | INTRAMUSCULAR | Status: AC
  Administered 2023-08-02: 10 mg via INTRAMUSCULAR

## 2023-08-02 NOTE — ED Provider Notes (Signed)
[facility]    CSN: [id number] Arrival date & time: 08/02/23  0932      History   Chief Complaint Chief Complaint  Patient presents with   Back Pain    Entered by patient    HPI Julia Levine is a 37 y.o. female.   The history is provided by the patient.   Patient presents for complaints of low back pain that started over the past 24 hours when she was picking up her son.  She states she felt an immediate "pop" in her lower back.  Since that time, she has pain in the lower back that radiates down to the top of her buttock.  Patient states that there is nothing that makes the pain worse or better.  When asked to describe the pain, patient states "it just hurts."  She states that it does feel like the lower back is being "pressed down."  She denies fever, chills, chest pain, abdominal pain, urinary symptoms, lower extremity weakness, numbness, or tingling.  Patient denies prior history of low back pain.  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 Authorizing Provider  methocarbamol (ROBAXIN) 500 MG tablet Take 1 tablet (500 mg total) by mouth 2 (two) times  daily. 08/02/23  Yes Leath-Warren, Sadie Haber, NP  predniSONE (DELTASONE) 20 MG tablet Take 2 tablets (40 mg total) by mouth daily with breakfast for 5 days. 08/02/23 08/07/23 Yes Leath-Warren, Sadie Haber, NP  metroNIDAZOLE (FLAGYL) 500 MG tablet Take 1 tablet (500 mg total) by mouth 2 (two) times daily. Patient not taking: Reported on 08/02/2023 06/12/23   Arabella Merles, CNM    Family History Family History  Problem Relation Age of Onset   Cancer Maternal Grandmother 22       cervical   Pulmonary embolism Maternal Grandfather    Hypertension Mother    Mitral valve prolapse Mother     Social History Social History   Tobacco Use   Smoking status: Never   Smokeless tobacco: Never  Vaping Use   Vaping status: Never Used  Substance Use Topics   Alcohol use: Not Currently   Drug use: No     Allergies   Patient has no known allergies.   Review of Systems Review of Systems Per HPI  Physical Exam Triage Vital Signs ED Triage Vitals  Encounter Vitals Group     BP 08/02/23 0948 Marland Kitchen)  104/57     Systolic BP Percentile --      Diastolic BP Percentile --      Pulse Rate 08/02/23 0948 99     Resp 08/02/23 0948 16     Temp 08/02/23 0948 98.2 F (36.8 C)     Temp Source 08/02/23 0948 Oral     SpO2 08/02/23 0948 95 %     Weight --      Height --      Head Circumference --      Peak Flow --      Pain Score 08/02/23 0947 7     Pain Loc --      Pain Education --      Exclude from Growth Chart --    No data found.  Updated Vital Signs BP (!) 104/57 (BP Location: Right Arm)   Pulse 99   Temp 98.2 F (36.8 C) (Oral)   Resp 16   LMP 07/27/2023 (Approximate)   SpO2 95%   Visual Acuity Right Eye Distance:   Left Eye Distance:   Bilateral Distance:    Right Eye Near:   Left Eye Near:    Bilateral Near:     Physical Exam Vitals and nursing note reviewed.  Constitutional:      Appearance: Normal appearance.  HENT:     Head: Normocephalic.  Eyes:     Extraocular  Movements: Extraocular movements intact.     Conjunctiva/sclera: Conjunctivae normal.     Pupils: Pupils are equal, round, and reactive to light.  Cardiovascular:     Rate and Rhythm: Normal rate and regular rhythm.     Pulses: Normal pulses.     Heart sounds: Normal heart sounds.  Pulmonary:     Effort: Pulmonary effort is normal. No respiratory distress.     Breath sounds: Normal breath sounds. No stridor. No wheezing, rhonchi or rales.  Abdominal:     General: Bowel sounds are normal.     Palpations: Abdomen is soft.     Tenderness: There is no abdominal tenderness.  Musculoskeletal:     Cervical back: Normal range of motion.     Lumbar back: Spasms and tenderness (Point tenderness noted in the mid back at L2, pain radiates down to L5.) present. No swelling, edema, deformity, signs of trauma or lacerations. Decreased range of motion. Negative right straight leg raise test and negative left straight leg raise test.  Lymphadenopathy:     Cervical: No cervical adenopathy.  Skin:    General: Skin is warm and dry.  Neurological:     General: No focal deficit present.     Mental Status: She is alert and oriented to person, place, and time.  Psychiatric:        Mood and Affect: Mood normal.        Behavior: Behavior normal.      UC Treatments / Results  Labs (all labs ordered are listed, but only abnormal results are displayed) Labs Reviewed - No data to display  EKG   Radiology No results found.  Procedures Procedures (including critical care time)  Medications Ordered in UC Medications  ketorolac (TORADOL) 30 MG/ML injection 30 mg (has no administration in time range)  dexamethasone (DECADRON) injection 10 mg (has no administration in time range)    Initial Impression / Assessment and Plan / UC Course  I have reviewed the triage vital signs and the nursing notes.  Pertinent labs & imaging results that were available during my care of  the patient were reviewed by me  and considered in my medical decision making (see chart for details).  Toradol 30 mg IM and Decadron 10 mg IM administered.  Suspect a lumbar strain/sprain given the patient's current symptoms.  Will provide symptomatic treatment with prednisone 40 mg for the next 5 days, and methocarbamol 500 mg for muscle spasm and pain.  Supportive care recommendations were provided and discussed with the patient to include over-the-counter analgesics, stretching exercises, and staying active.  Discussed indications with the patient regarding when ER follow-up will be indicated.  Patient was advised if symptoms have not improved over the next 2 weeks, recommend considering follow-up with orthopedics for further evaluation.  Patient was in agreement with this plan of care and verbalized understanding.  All questions were answered.  Patient stable for discharge.   Final Clinical Impressions(s) / UC Diagnoses   Final diagnoses:  Lumbar strain, initial encounter     Discharge Instructions      You have been given injections of Toradol 30 mg and Decadron 10 mg today.  Do not take any additional NSAIDs to include ibuprofen, Motrin, naproxen, Advil or Aleve.  You may take over-the-counter Tylenol arthritis strength 650 mg tablets as needed for breakthrough pain. Take medication as prescribed. Try to remain as active as possible. I have provided exercises for you to perform at home.  Try to perform the exercises at least twice daily while symptoms persist. May apply ice or heat as needed.  Ice is recommended for pain or swelling, heat for spasm or stiffness.  Apply for 20 minutes, remove for 1 hour, then repeat. Go to the emergency department immediately if you develop weakness in your legs or feet, inability to walk, loss of bowel or bladder function, difficulty urinating or passing a bowel movement, or other concerns. If symptoms have not improved over the next 1 to 2 weeks, consider following up with orthopedics  for further evaluation. Follow-up as needed.     ED Prescriptions     Medication Sig Dispense Auth. Provider   predniSONE (DELTASONE) 20 MG tablet Take 2 tablets (40 mg total) by mouth daily with breakfast for 5 days. 10 tablet Leath-Warren, Sadie Haber, NP   methocarbamol (ROBAXIN) 500 MG tablet Take 1 tablet (500 mg total) by mouth 2 (two) times daily. 20 tablet Leath-Warren, Sadie Haber, NP      PDMP not reviewed this encounter.   Abran Cantor, NP 08/02/23 [phone]

## 2023-08-02 NOTE — Discharge Instructions (Addendum)
You have been given injections of Toradol 30 mg and Decadron 10 mg today.  Do not take any additional NSAIDs to include ibuprofen, Motrin, naproxen, Advil or Aleve.  You may take over-the-counter Tylenol arthritis strength 650 mg tablets as needed for breakthrough pain. Take medication as prescribed. Try to remain as active as possible. I have provided exercises for you to perform at home.  Try to perform the exercises at least twice daily while symptoms persist. May apply ice or heat as needed.  Ice is recommended for pain or swelling, heat for spasm or stiffness.  Apply for 20 minutes, remove for 1 hour, then repeat. Go to the emergency department immediately if you develop weakness in your legs or feet, inability to walk, loss of bowel or bladder function, difficulty urinating or passing a bowel movement, or other concerns. If symptoms have not improved over the next 1 to 2 weeks, consider following up with orthopedics for further evaluation. Follow-up as needed.

## 2023-08-02 NOTE — ED Triage Notes (Signed)
Pt reports back pain that started yesterday when picking up her son. Now having lower back pain that gets worse with movement.
